# Patient Record
Sex: Male | Born: 1955 | Race: White | Hispanic: No | Marital: Married | Smoking: Never smoker
Health system: Southern US, Community
[De-identification: ages and names within clinical notes are randomized; demographics above are authoritative.]

## PROBLEM LIST (undated history)

## (undated) DIAGNOSIS — H26003 Unspecified infantile and juvenile cataract, bilateral: Secondary | ICD-10-CM

## (undated) DIAGNOSIS — E119 Type 2 diabetes mellitus without complications: Principal | ICD-10-CM

## (undated) DIAGNOSIS — H409 Unspecified glaucoma: Secondary | ICD-10-CM

## (undated) HISTORY — DX: Unspecified glaucoma: H40.9

## (undated) HISTORY — PX: EYE SURGERY: SHX253

## (undated) HISTORY — DX: Type 2 diabetes mellitus without complications: E11.9

## (undated) HISTORY — DX: Unspecified infantile and juvenile cataract, bilateral: H26.003

---

## 1986-04-03 DIAGNOSIS — E119 Type 2 diabetes mellitus without complications: Secondary | ICD-10-CM

## 1986-04-03 HISTORY — DX: Type 2 diabetes mellitus without complications: E11.9

## 2002-01-27 ENCOUNTER — Ambulatory Visit (HOSPITAL_BASED_OUTPATIENT_CLINIC_OR_DEPARTMENT_OTHER): Admission: RE | Admit: 2002-01-27 | Discharge: 2002-01-27 | Payer: Self-pay | Admitting: Family Medicine

## 2004-06-29 ENCOUNTER — Ambulatory Visit: Payer: Self-pay | Admitting: Family Medicine

## 2005-01-19 ENCOUNTER — Ambulatory Visit: Payer: Self-pay | Admitting: Internal Medicine

## 2005-11-13 ENCOUNTER — Ambulatory Visit: Payer: Self-pay | Admitting: Internal Medicine

## 2005-12-05 ENCOUNTER — Ambulatory Visit: Payer: Self-pay | Admitting: Internal Medicine

## 2007-03-12 ENCOUNTER — Telehealth (INDEPENDENT_AMBULATORY_CARE_PROVIDER_SITE_OTHER): Payer: Self-pay | Admitting: *Deleted

## 2007-03-13 ENCOUNTER — Telehealth (INDEPENDENT_AMBULATORY_CARE_PROVIDER_SITE_OTHER): Payer: Self-pay | Admitting: *Deleted

## 2007-09-18 ENCOUNTER — Ambulatory Visit: Payer: Self-pay | Admitting: Internal Medicine

## 2007-09-18 LAB — CONVERTED CEMR LAB
ALT: 54 units/L — ABNORMAL HIGH (ref 0–53)
AST: 39 units/L — ABNORMAL HIGH (ref 0–37)
BUN: 12 mg/dL (ref 6–23)
Calcium: 9.4 mg/dL (ref 8.4–10.5)
Chloride: 99 meq/L (ref 96–112)
Cholesterol: 200 mg/dL (ref 0–200)
Creatinine, Ser: 0.81 mg/dL (ref 0.40–1.50)
HDL: 37 mg/dL — ABNORMAL LOW (ref >39)
Microalb, Ur: 0.44 mg/dL (ref 0.00–1.89)
Total Bilirubin: 1 mg/dL (ref 0.3–1.2)
Total CHOL/HDL Ratio: 5.4
VLDL: 37 mg/dL (ref 0–40)

## 2007-09-20 ENCOUNTER — Ambulatory Visit: Payer: Self-pay | Admitting: *Deleted

## 2007-11-28 ENCOUNTER — Ambulatory Visit: Payer: Self-pay | Admitting: Internal Medicine

## 2008-01-08 ENCOUNTER — Ambulatory Visit: Payer: Self-pay | Admitting: Internal Medicine

## 2008-01-08 LAB — CONVERTED CEMR LAB
Cholesterol: 164 mg/dL (ref 0–200)
Total CHOL/HDL Ratio: 4.6
VLDL: 41 mg/dL — ABNORMAL HIGH (ref 0–40)

## 2008-04-16 ENCOUNTER — Ambulatory Visit: Payer: Self-pay | Admitting: Internal Medicine

## 2008-04-21 ENCOUNTER — Ambulatory Visit: Payer: Self-pay | Admitting: Internal Medicine

## 2008-05-07 ENCOUNTER — Ambulatory Visit: Payer: Self-pay | Admitting: Internal Medicine

## 2008-07-15 ENCOUNTER — Ambulatory Visit: Payer: Self-pay | Admitting: Internal Medicine

## 2008-09-03 ENCOUNTER — Ambulatory Visit: Payer: Self-pay | Admitting: Internal Medicine

## 2008-10-14 ENCOUNTER — Ambulatory Visit: Payer: Self-pay | Admitting: Internal Medicine

## 2009-04-29 ENCOUNTER — Ambulatory Visit: Payer: Self-pay | Admitting: Internal Medicine

## 2012-01-11 ENCOUNTER — Ambulatory Visit (INDEPENDENT_AMBULATORY_CARE_PROVIDER_SITE_OTHER): Payer: Self-pay | Admitting: Family Medicine

## 2012-01-11 ENCOUNTER — Encounter: Payer: Self-pay | Admitting: Family Medicine

## 2012-01-11 VITALS — BP 116/74 | HR 84 | Temp 99.0°F | Ht 72.0 in | Wt 202.7 lb

## 2012-01-11 DIAGNOSIS — H269 Unspecified cataract: Secondary | ICD-10-CM

## 2012-01-11 DIAGNOSIS — E119 Type 2 diabetes mellitus without complications: Secondary | ICD-10-CM

## 2012-01-11 DIAGNOSIS — H409 Unspecified glaucoma: Secondary | ICD-10-CM

## 2012-01-11 DIAGNOSIS — H26003 Unspecified infantile and juvenile cataract, bilateral: Secondary | ICD-10-CM

## 2012-01-11 HISTORY — DX: Unspecified infantile and juvenile cataract, bilateral: H26.003

## 2012-01-11 HISTORY — DX: Type 2 diabetes mellitus without complications: E11.9

## 2012-01-11 MED ORDER — LISINOPRIL 10 MG PO TABS: 10.0000 mg | ORAL_TABLET | Freq: Every day | ORAL | Status: DC

## 2012-01-11 MED ORDER — PIOGLITAZONE HCL 30 MG PO TABS: 30.0000 mg | ORAL_TABLET | Freq: Every day | ORAL | Status: DC

## 2012-01-11 MED ORDER — GLYBURIDE 5 MG PO TABS: 10.0000 mg | ORAL_TABLET | Freq: Two times a day (BID) | ORAL | Status: DC

## 2012-01-11 MED ORDER — METFORMIN HCL ER 750 MG PO TB24: 750.0000 mg | ORAL_TABLET | Freq: Two times a day (BID) | ORAL | Status: DC

## 2012-01-11 MED ORDER — CITALOPRAM HYDROBROMIDE 40 MG PO TABS: 40.0000 mg | ORAL_TABLET | Freq: Every day | ORAL | Status: DC

## 2012-01-11 NOTE — Progress Notes (Signed)
Patient ID: Robert Bowen, male   DOB: 07-Jun-1955, 56 y.o.   MRN: [id number] Subjective: The patient is a 56 y.o. year old male who presents today for new patient appointment.  1. DM: Diagnosed in 30s.  mantained on orals all that time.  Doesn't check BG regularly.  Last A1c was around a year ago.  Thinks it was around 7.  No problems with fevers, chills, shortness of breath, palpitations, low blood sugar, or excessive weight gain.  2. Cough: Has happened several times in the last year.  Develops cough (non-productive), slight sore throat, rhinorrhea and fatigue.  This lasts for 1-2 weeks.  Currently has a mild case of this.  No fevers, chills, shortness of breath, or other symptoms.  Patient's past medical, social, and family history were reviewed and updated as appropriate. History  Substance Use Topics  . Smoking status: Never Smoker   . Smokeless tobacco: Never Used  . Alcohol Use: Yes     Social   Objective:  Filed Vitals:   01/11/12 0952  BP: 116/74  Pulse: 84  Temp: 99 F (37.2 C)   Gen: NAD HEENT: MMM, EOMI, cataracts bilaterally, left worse than right, TM normal b/l.  Pharynx non-erythematous, slight clear nasal drainage CV: RRR, no murmurs Resp: CTABL Ext: No edema  Assessment/Plan: Cough: Unclear etiology.  Probably combination of allergies, URI, and possibly contribution from lisinopril.  Will try brief cessation of lisinopril and see if this improves.  Please also see individual problems in problem list for problem-specific plans.

## 2012-01-11 NOTE — Patient Instructions (Signed)
It was great to see you today! I want you to go ahead and try going off your lisinopril for 2 weeks. I am giving you refills on your prescriptions.  It is OK for you to use the actos with your glyburide. Come back to see me in a couple months once you have gone through the orange card process so we can do some labs.

## 2012-01-16 NOTE — Assessment & Plan Note (Signed)
Continue current meds.  Refills given.  Will hold off on labs until seen by financial assistance.  At that time would plan to obtain A1c, FLP, CBC, and CMET.

## 2012-05-22 ENCOUNTER — Other Ambulatory Visit: Payer: Self-pay | Admitting: *Deleted

## 2012-05-23 MED ORDER — LISINOPRIL 10 MG PO TABS: 10.0000 mg | ORAL_TABLET | Freq: Every day | ORAL | Status: DC

## 2012-10-24 ENCOUNTER — Telehealth: Payer: Self-pay | Admitting: *Deleted

## 2012-10-24 NOTE — Telephone Encounter (Signed)
Pt called to ask about yearly  Visit - a1 c - pt replied he has no insurance and does not qualify for orange card or other insurance - referred to [facility], RN-BSN

## 2012-11-21 ENCOUNTER — Other Ambulatory Visit: Payer: Self-pay | Admitting: Family Medicine

## 2012-11-22 ENCOUNTER — Other Ambulatory Visit: Payer: Self-pay | Admitting: *Deleted

## 2012-11-22 ENCOUNTER — Telehealth: Payer: Self-pay | Admitting: *Deleted

## 2012-11-22 NOTE — Telephone Encounter (Signed)
Received message from [facility], they faxed over a request for Celexa on this patient and never received it back. They will send another request today.Busick, Rodena Medin

## 2012-11-23 MED ORDER — CITALOPRAM HYDROBROMIDE 40 MG PO TABS: 40.0000 mg | ORAL_TABLET | Freq: Every day | ORAL | Status: DC

## 2012-11-23 NOTE — Telephone Encounter (Signed)
Refilled citalopram prescription for one month. Patient needs to be seen in the office as I have never met her.

## 2013-01-22 ENCOUNTER — Other Ambulatory Visit: Payer: Self-pay | Admitting: Family Medicine

## 2013-01-28 NOTE — Telephone Encounter (Signed)
Informed patient about refill and that he needs to make an appointment before any more refills will be done.

## 2013-01-28 NOTE — Telephone Encounter (Signed)
Informed patient about refill and that he needs to make an appointment before any more refills will be done.  

## 2013-02-11 ENCOUNTER — Other Ambulatory Visit: Payer: Self-pay | Admitting: Family Medicine

## 2013-03-26 ENCOUNTER — Other Ambulatory Visit: Payer: Self-pay | Admitting: Family Medicine

## 2013-04-14 ENCOUNTER — Telehealth: Payer: Self-pay | Admitting: *Deleted

## 2013-04-14 NOTE — Telephone Encounter (Signed)
Spoke with pt regarding him needing an appt to check on his diabetes.  States that he is unable to afford to come in and that is why it has been over a year since his last visit.  Also he has tried applying for [facility] but was told he made too much money.  Informed pt that we would like to see him to check up when he thinks he is able to make it in.  He voiced understanding for this. Jazmin Hartsell,CMA

## 2016-03-01 ENCOUNTER — Ambulatory Visit (INDEPENDENT_AMBULATORY_CARE_PROVIDER_SITE_OTHER): Payer: Self-pay | Admitting: Physician Assistant

## 2016-03-01 ENCOUNTER — Encounter: Payer: Self-pay | Admitting: Physician Assistant

## 2016-03-01 VITALS — BP 124/70 | HR 76 | Temp 98.1°F | Resp 16 | Ht 72.0 in | Wt 195.0 lb

## 2016-03-01 DIAGNOSIS — F419 Anxiety disorder, unspecified: Secondary | ICD-10-CM

## 2016-03-01 DIAGNOSIS — Z23 Encounter for immunization: Secondary | ICD-10-CM

## 2016-03-01 DIAGNOSIS — B9689 Other specified bacterial agents as the cause of diseases classified elsewhere: Secondary | ICD-10-CM

## 2016-03-01 DIAGNOSIS — F329 Major depressive disorder, single episode, unspecified: Secondary | ICD-10-CM

## 2016-03-01 DIAGNOSIS — J019 Acute sinusitis, unspecified: Secondary | ICD-10-CM

## 2016-03-01 DIAGNOSIS — F418 Other specified anxiety disorders: Secondary | ICD-10-CM

## 2016-03-01 DIAGNOSIS — E119 Type 2 diabetes mellitus without complications: Secondary | ICD-10-CM

## 2016-03-01 LAB — HEMOGLOBIN A1C: Hgb A1c MFr Bld: 6.6 % — ABNORMAL HIGH (ref 4.6–6.5)

## 2016-03-01 LAB — BASIC METABOLIC PANEL
BUN: 14 mg/dL (ref 6–23)
CALCIUM: 9.8 mg/dL (ref 8.4–10.5)
CO2: 30 meq/L (ref 19–32)
CREATININE: 0.9 mg/dL (ref 0.40–1.50)
Chloride: 100 mEq/L (ref 96–112)
GFR: 91.1 mL/min (ref >60.00)
Glucose, Bld: 195 mg/dL — ABNORMAL HIGH (ref 70–99)
Potassium: 4.5 mEq/L (ref 3.5–5.1)
Sodium: 137 mEq/L (ref 135–145)

## 2016-03-01 MED ORDER — GLYBURIDE 5 MG PO TABS: ORAL_TABLET | ORAL | 0 refills | Status: DC

## 2016-03-01 MED ORDER — AMOXICILLIN-POT CLAVULANATE 875-125 MG PO TABS: 1.0000 | ORAL_TABLET | Freq: Two times a day (BID) | ORAL | 0 refills | Status: DC

## 2016-03-01 MED ORDER — METFORMIN HCL ER 750 MG PO TB24: ORAL_TABLET | ORAL | 0 refills | Status: DC

## 2016-03-01 NOTE — Patient Instructions (Signed)
Please go to the lab for blood work. I will call you with your results and we will alter your regimen accordingly.  I would like for you to start checking fasting sugar every few days. If you are noting fasting sugars < 80, I would like to decrease your dose of the Diabeta.  Keep active and eating a well-balanced diet.   Follow-up will be based on lab results.   Please take antibiotic as directed.  Increase fluid intake.  Use Saline nasal spray.  Take a daily multivitamin.  Place a humidifier in the bedroom.  Please call or return clinic if symptoms are not improving.  Sinusitis Sinusitis is redness, soreness, and swelling (inflammation) of the paranasal sinuses. Paranasal sinuses are air pockets within the bones of your face (beneath the eyes, the middle of the forehead, or above the eyes). In healthy paranasal sinuses, mucus is able to drain out, and air is able to circulate through them by way of your nose. However, when your paranasal sinuses are inflamed, mucus and air can become trapped. This can allow bacteria and other germs to grow and cause infection. Sinusitis can develop quickly and last only a short time (acute) or continue over a long period (chronic). Sinusitis that lasts for more than 12 weeks is considered chronic.  CAUSES  Causes of sinusitis include:  Allergies.  Structural abnormalities, such as displacement of the cartilage that separates your nostrils (deviated septum), which can decrease the air flow through your nose and sinuses and affect sinus drainage.  Functional abnormalities, such as when the small hairs (cilia) that line your sinuses and help remove mucus do not work properly or are not present. SYMPTOMS  Symptoms of acute and chronic sinusitis are the same. The primary symptoms are pain and pressure around the affected sinuses. Other symptoms include:  Upper toothache.  Earache.  Headache.  Bad breath.  Decreased sense of smell and taste.  A cough,  which worsens when you are lying flat.  Fatigue.  Fever.  Thick drainage from your nose, which often is green and may contain pus (purulent).  Swelling and warmth over the affected sinuses. DIAGNOSIS  Your caregiver will perform a physical exam. During the exam, your caregiver may:  Look in your nose for signs of abnormal growths in your nostrils (nasal polyps).  Tap over the affected sinus to check for signs of infection.  View the inside of your sinuses (endoscopy) with a special imaging device with a light attached (endoscope), which is inserted into your sinuses. If your caregiver suspects that you have chronic sinusitis, one or more of the following tests may be recommended:  Allergy tests.  Nasal culture A sample of mucus is taken from your nose and sent to a lab and screened for bacteria.  Nasal cytology A sample of mucus is taken from your nose and examined by your caregiver to determine if your sinusitis is related to an allergy. TREATMENT  Most cases of acute sinusitis are related to a viral infection and will resolve on their own within 10 days. Sometimes medicines are prescribed to help relieve symptoms (pain medicine, decongestants, nasal steroid sprays, or saline sprays).  However, for sinusitis related to a bacterial infection, your caregiver will prescribe antibiotic medicines. These are medicines that will help kill the bacteria causing the infection.  Rarely, sinusitis is caused by a fungal infection. In theses cases, your caregiver will prescribe antifungal medicine. For some cases of chronic sinusitis, surgery is needed. Generally, these are  cases in which sinusitis recurs more than 3 times per year, despite other treatments. HOME CARE INSTRUCTIONS   Drink plenty of water. Water helps thin the mucus so your sinuses can drain more easily.  Use a humidifier.  Inhale steam 3 to 4 times a day (for example, sit in the bathroom with the shower running).  Apply a  warm, moist washcloth to your face 3 to 4 times a day, or as directed by your caregiver.  Use saline nasal sprays to help moisten and clean your sinuses.  Take over-the-counter or prescription medicines for pain, discomfort, or fever only as directed by your caregiver. SEEK IMMEDIATE MEDICAL CARE IF:  You have increasing pain or severe headaches.  You have nausea, vomiting, or drowsiness.  You have swelling around your face.  You have vision problems.  You have a stiff neck.  You have difficulty breathing. MAKE SURE YOU:   Understand these instructions.  Will watch your condition.  Will get help right away if you are not doing well or get worse. Document Released: 03/20/2005 Document Revised: 06/12/2011 Document Reviewed: 04/04/2011 [facility] Patient Information 2014 [place], [place].

## 2016-03-01 NOTE — Progress Notes (Signed)
Pre visit review using our clinic review tool, if applicable. No additional management support is needed unless otherwise documented below in the visit note. 

## 2016-03-01 NOTE — Progress Notes (Signed)
Patient presents to clinic today to establish care.   Patient with history of Diabetes Mellitus II, previously controlled. Patient is on current regimen of Metformin XR 750 mg BID, Glyburide 10 mg BID. Was previously on Farxiga 5 mg daily as well but patient assistance expired and they cannot afford. Patient is not checking sugars. Is watching diet and exercising regularly. Denies numbness or tingling of extremities. Denies history of CKD or diabetic nephropathy. Denies any foot sores, discomfort or other abnormality.   Patient also with history of Anxiety/Depression, well-controlled on Citalopram 40 mg daily. Is taking as directed without side effects. Denies breakthrough depression, anhedonia or anxiety on this medication. Denies SI/HI.  Patient c/o > 1 week of sinus pressure, maxillary sinus pain, dry cough, post-nasal drip, acutely worsening 5 days ago. Denies fever, chills. Has taken Allegra without improvement in symptoms. Denies recent travel or sick contact.  Past Medical History:  Diagnosis Date  . Diabetes mellitus, type 2 ([place]) 1988  . Glaucoma   . Juvenile cataract of both eyes     Current Outpatient Prescriptions on File Prior to Visit  Medication Sig Dispense Refill  . citalopram (CELEXA) 40 MG tablet TAKE 1 TABLET (40 MG TOTAL) BY MOUTH DAILY-NEED TO MAKE APPOINTMENT WITH PRIMARY CARE DOCTOR. 30 tablet 0   No current facility-administered medications on file prior to visit.     No Known Allergies  Family History  Problem Relation Age of Onset  . Diabetes type II Father   . Heart disease Father   . Liver cancer Mother     Social History   Social History  . Marital status: Married    Spouse name: N/A  . Number of children: N/A  . Years of education: N/A   Social History Main Topics  . Smoking status: Never Smoker  . Smokeless tobacco: Never Used  . Alcohol use Yes     Comment: Social  . Drug use: No  . Sexual activity: Yes   Other Topics Concern  .  None   Social History Narrative   Part time employment.   Non-denominational pastor.   Review of Systems - See HPI.  All other ROS are negative.  BP 124/70   Pulse 76   Temp 98.1 F (36.7 C) (Oral)   Resp 16   Ht 6' (1.829 m)   Wt 195 lb (88.5 kg)   SpO2 98%   BMI 26.45 kg/m   Physical Exam  Constitutional: He is oriented to person, place, and time and well-developed, well-nourished, and in no distress.  HENT:  Head: Normocephalic and atraumatic.  Right Ear: Tympanic membrane normal.  Left Ear: Tympanic membrane normal.  Nose: Mucosal edema present. Right sinus exhibits maxillary sinus tenderness and frontal sinus tenderness.  Mouth/Throat: Uvula is midline, oropharynx is clear and moist and mucous membranes are normal.  Eyes: Conjunctivae are normal.  Neck: Neck supple.  Cardiovascular: Normal rate, regular rhythm, normal heart sounds and intact distal pulses.   Pulmonary/Chest: Effort normal and breath sounds normal. No respiratory distress. He has no wheezes. He has no rales. He exhibits no tenderness.  Neurological: He is alert and oriented to person, place, and time.  Skin: Skin is warm and dry. No rash noted.  Psychiatric: Affect normal.  Vitals reviewed.   Recent Results (from the past 2160 hour(s))  Basic metabolic panel     Status: Abnormal   Collection Time: 03/01/16  2:35 PM  Result Value Ref Range   Sodium 137 135 -  145 mEq/L   Potassium 4.5 3.5 - 5.1 mEq/L   Chloride 100 96 - 112 mEq/L   CO2 30 19 - 32 mEq/L   Glucose, Bld 195 (H) 70 - 99 mg/dL   BUN 14 6 - 23 mg/dL   Creatinine, Ser 0.90 0.40 - 1.50 mg/dL   Calcium 9.8 8.4 - 10.5 mg/dL   GFR 91.10 >60.00 mL/min  Hemoglobin A1c     Status: Abnormal   Collection Time: 03/01/16  2:35 PM  Result Value Ref Range   Hgb A1c MFr Bld 6.6 (H) 4.6 - 6.5 %    Comment: Glycemic Control Guidelines for People with Diabetes:Non Diabetic:  <6%Goal of Therapy: <7%Additional Action Suggested:  >8%      Assessment/Plan: Controlled type 2 diabetes mellitus without complication, without long-term current use of insulin (HCC) Will repeat BMP and A1C today. Patient assistance for [place] completed. Continue current medications. Goal will be to wean him down off of the Diabeta if possible. I am not a fan of long-term SU especially at such a high dosage.   Anxiety and depression Doing very well. Continue current medication regimen.   Acute bacterial sinusitis Rx Augmentin.  Increase fluids.  Rest.  Saline nasal spray.  Probiotic.  Humidifier in bedroom.  Call or return to clinic if symptoms are not improving.     Leeanne Rio, PA-C

## 2016-03-02 DIAGNOSIS — E119 Type 2 diabetes mellitus without complications: Secondary | ICD-10-CM | POA: Insufficient documentation

## 2016-03-02 DIAGNOSIS — F329 Major depressive disorder, single episode, unspecified: Secondary | ICD-10-CM | POA: Insufficient documentation

## 2016-03-02 DIAGNOSIS — F32A Depression, unspecified: Secondary | ICD-10-CM

## 2016-03-02 DIAGNOSIS — B9689 Other specified bacterial agents as the cause of diseases classified elsewhere: Secondary | ICD-10-CM | POA: Insufficient documentation

## 2016-03-02 DIAGNOSIS — J019 Acute sinusitis, unspecified: Secondary | ICD-10-CM

## 2016-03-02 DIAGNOSIS — F419 Anxiety disorder, unspecified: Secondary | ICD-10-CM

## 2016-03-02 HISTORY — DX: Depression, unspecified: F32.A

## 2016-03-02 HISTORY — DX: Anxiety disorder, unspecified: F41.9

## 2016-03-02 HISTORY — DX: Type 2 diabetes mellitus without complications: E11.9

## 2016-03-02 NOTE — Assessment & Plan Note (Signed)
Will repeat BMP and A1C today. Patient assistance for Robert Bowen completed. Continue current medications. Goal will be to wean him down off of the Diabeta if possible. I am not a fan of long-term SU especially at such a high dosage.

## 2016-03-02 NOTE — Assessment & Plan Note (Signed)
Doing very well. Continue current medication regimen.  

## 2016-03-02 NOTE — Assessment & Plan Note (Signed)
Rx Augmentin.  Increase fluids.  Rest.  Saline nasal spray.  Probiotic.  Humidifier in bedroom.  Call or return to clinic if symptoms are not improving.

## 2016-03-06 ENCOUNTER — Telehealth: Payer: Self-pay | Admitting: Physician Assistant

## 2016-03-06 NOTE — Telephone Encounter (Signed)
Wife states that pt in not feeling any better from last weeks visit and asking if there is anything he could try or do.

## 2016-03-06 NOTE — Telephone Encounter (Signed)
Called the patient wife back at number given but does not work. LMOVM for patient for current symptoms.

## 2016-03-07 ENCOUNTER — Other Ambulatory Visit: Payer: Self-pay | Admitting: Emergency Medicine

## 2016-03-07 ENCOUNTER — Encounter: Payer: Self-pay | Admitting: Emergency Medicine

## 2016-03-07 MED ORDER — FLUTICASONE PROPIONATE 50 MCG/ACT NA SUSP: 2.0000 | Freq: Every day | NASAL | 3 refills | Status: DC

## 2016-03-07 NOTE — Telephone Encounter (Signed)
Per Einar Pheasant can try Flonase 50 mcg 1-2 sprays each nostril daily to help and finished the abx.   Spoke with patient, he is still having sinus pain of the right maxillary region, runny/dripping nose. He still has 1-2 days left of the abx.

## 2016-03-07 NOTE — Telephone Encounter (Signed)
Called the number given and the phone line is busy.

## 2016-03-08 NOTE — Telephone Encounter (Signed)
Sent message by Mychart informing patient

## 2016-03-22 ENCOUNTER — Other Ambulatory Visit: Payer: Self-pay | Admitting: Emergency Medicine

## 2016-03-22 ENCOUNTER — Telehealth: Payer: Self-pay | Admitting: Physician Assistant

## 2016-03-22 MED ORDER — CITALOPRAM HYDROBROMIDE 40 MG PO TABS: 40.0000 mg | ORAL_TABLET | Freq: Every day | ORAL | 3 refills | Status: DC

## 2016-03-22 NOTE — Telephone Encounter (Signed)
Tyler w/[facility] states pt is requesting a refill on citalopram

## 2016-03-22 NOTE — Telephone Encounter (Signed)
Sent refills of the Citalopram to the [facility]

## 2016-05-10 ENCOUNTER — Other Ambulatory Visit: Payer: Self-pay | Admitting: Physician Assistant

## 2016-05-10 DIAGNOSIS — E119 Type 2 diabetes mellitus without complications: Secondary | ICD-10-CM

## 2016-05-17 ENCOUNTER — Ambulatory Visit (INDEPENDENT_AMBULATORY_CARE_PROVIDER_SITE_OTHER): Payer: Self-pay | Admitting: Physician Assistant

## 2016-05-17 ENCOUNTER — Encounter: Payer: Self-pay | Admitting: Physician Assistant

## 2016-05-17 VITALS — Temp 98.0°F | Resp 14 | Ht 72.0 in | Wt 193.0 lb

## 2016-05-17 DIAGNOSIS — M7062 Trochanteric bursitis, left hip: Secondary | ICD-10-CM

## 2016-05-17 DIAGNOSIS — E119 Type 2 diabetes mellitus without complications: Secondary | ICD-10-CM

## 2016-05-17 DIAGNOSIS — B9689 Other specified bacterial agents as the cause of diseases classified elsewhere: Secondary | ICD-10-CM

## 2016-05-17 DIAGNOSIS — J019 Acute sinusitis, unspecified: Secondary | ICD-10-CM

## 2016-05-17 LAB — GLUCOSE, POCT (MANUAL RESULT ENTRY): POC GLUCOSE: 193 mg/dL — AB (ref 70–99)

## 2016-05-17 MED ORDER — DOXYCYCLINE HYCLATE 100 MG PO CAPS: 100.0000 mg | ORAL_CAPSULE | Freq: Two times a day (BID) | ORAL | 0 refills | Status: DC

## 2016-05-17 MED ORDER — MELOXICAM 15 MG PO TABS: 15.0000 mg | ORAL_TABLET | Freq: Every day | ORAL | 0 refills | Status: DC

## 2016-05-17 NOTE — Patient Instructions (Addendum)
Please take antibiotic as directed.  Increase fluid intake.  Use Saline nasal spray.  Take a daily multivitamin.  Place a humidifier in the bedroom.  Please call or return clinic if symptoms are not improving.  Please start the Meloxicam with food once daily to help with hip pain. I am setting you up with Sports Medicine for further treatment.   Follow-up with me in 1-2 weeks fasting. We will follow-up on diabetes and check lipid panel since we have not received records from previous primary provider.  Sinusitis Sinusitis is redness, soreness, and swelling (inflammation) of the paranasal sinuses. Paranasal sinuses are air pockets within the bones of your face (beneath the eyes, the middle of the forehead, or above the eyes). In healthy paranasal sinuses, mucus is able to drain out, and air is able to circulate through them by way of your nose. However, when your paranasal sinuses are inflamed, mucus and air can become trapped. This can allow bacteria and other germs to grow and cause infection. Sinusitis can develop quickly and last only a short time (acute) or continue over a long period (chronic). Sinusitis that lasts for more than 12 weeks is considered chronic.  CAUSES  Causes of sinusitis include:  Allergies.  Structural abnormalities, such as displacement of the cartilage that separates your nostrils (deviated septum), which can decrease the air flow through your nose and sinuses and affect sinus drainage.  Functional abnormalities, such as when the small hairs (cilia) that line your sinuses and help remove mucus do not work properly or are not present. SYMPTOMS  Symptoms of acute and chronic sinusitis are the same. The primary symptoms are pain and pressure around the affected sinuses. Other symptoms include:  Upper toothache.  Earache.  Headache.  Bad breath.  Decreased sense of smell and taste.  A cough, which worsens when you are lying flat.  Fatigue.  Fever.  Thick  drainage from your nose, which often is green and may contain pus (purulent).  Swelling and warmth over the affected sinuses. DIAGNOSIS  Your caregiver will perform a physical exam. During the exam, your caregiver may:  Look in your nose for signs of abnormal growths in your nostrils (nasal polyps).  Tap over the affected sinus to check for signs of infection.  View the inside of your sinuses (endoscopy) with a special imaging device with a light attached (endoscope), which is inserted into your sinuses. If your caregiver suspects that you have chronic sinusitis, one or more of the following tests may be recommended:  Allergy tests.  Nasal culture A sample of mucus is taken from your nose and sent to a lab and screened for bacteria.  Nasal cytology A sample of mucus is taken from your nose and examined by your caregiver to determine if your sinusitis is related to an allergy. TREATMENT  Most cases of acute sinusitis are related to a viral infection and will resolve on their own within 10 days. Sometimes medicines are prescribed to help relieve symptoms (pain medicine, decongestants, nasal steroid sprays, or saline sprays).  However, for sinusitis related to a bacterial infection, your caregiver will prescribe antibiotic medicines. These are medicines that will help kill the bacteria causing the infection.  Rarely, sinusitis is caused by a fungal infection. In theses cases, your caregiver will prescribe antifungal medicine. For some cases of chronic sinusitis, surgery is needed. Generally, these are cases in which sinusitis recurs more than 3 times per year, despite other treatments. HOME CARE INSTRUCTIONS   Drink  plenty of water. Water helps thin the mucus so your sinuses can drain more easily.  Use a humidifier.  Inhale steam 3 to 4 times a day (for example, sit in the bathroom with the shower running).  Apply a warm, moist washcloth to your face 3 to 4 times a day, or as directed by  your caregiver.  Use saline nasal sprays to help moisten and clean your sinuses.  Take over-the-counter or prescription medicines for pain, discomfort, or fever only as directed by your caregiver. SEEK IMMEDIATE MEDICAL CARE IF:  You have increasing pain or severe headaches.  You have nausea, vomiting, or drowsiness.  You have swelling around your face.  You have vision problems.  You have a stiff neck.  You have difficulty breathing. MAKE SURE YOU:   Understand these instructions.  Will watch your condition.  Will get help right away if you are not doing well or get worse. Document Released: 03/20/2005 Document Revised: 06/12/2011 Document Reviewed: 04/04/2011 [facility] Patient Information 2014 [place], [place].

## 2016-05-17 NOTE — Progress Notes (Signed)
Patient presents to clinic today c/o sinus headache and fatigue with decreased appetite since Sunday. Has had a few weeks of nasal congestion with sinus pressure. Now with sinus pain. Noted some dizziness with turning head quickly, lasting only a few seconds. Is taking allegra to help with allergy symptoms. Patient denies fever, chills. Denies recent travel or sick contact.   Patient also endorses point tenderness of L hip over the past few weeks that has gradually worsened. Denies skin changes. Denies numbness or tingling. Denies bruising or swelling. Denies trauma or injury to the area. Some pain with ambulation but pain mainly occurs if there is pressure applied to the area. Has taken Ibuprofen with some improvement in symptoms.  Past Medical History:  Diagnosis Date  . Diabetes mellitus, type 2 ([place]) 1988  . Glaucoma   . Juvenile cataract of both eyes     Current Outpatient Prescriptions on File Prior to Visit  Medication Sig Dispense Refill  . citalopram (CELEXA) 40 MG tablet Take 1 tablet (40 mg total) by mouth daily. 30 tablet 3  . fluticasone (FLONASE) 50 MCG/ACT nasal spray Place 2 sprays into both nostrils daily. 16 g 3  . glyBURIDE (DIABETA) 5 MG tablet TAKE 2 TABLETS BY MOUTH TWICE DAILY WITH MEALS 120 tablet 0  . metFORMIN (GLUCOPHAGE-XR) 750 MG 24 hr tablet TAKE 1 TABLET (750 MG TOTAL) BY MOUTH 2 (TWO) TIMES DAILY. 60 tablet 0  . dapagliflozin propanediol (FARXIGA) 5 MG TABS tablet Take 1 mg by mouth daily.     No current facility-administered medications on file prior to visit.     No Known Allergies  Family History  Problem Relation Age of Onset  . Diabetes type II Father   . Heart disease Father   . Liver cancer Mother     Social History   Social History  . Marital status: Married    Spouse name: N/A  . Number of children: N/A  . Years of education: N/A   Social History Main Topics  . Smoking status: Never Smoker  . Smokeless tobacco: Never Used  .  Alcohol use Yes     Comment: Social  . Drug use: No  . Sexual activity: Yes   Other Topics Concern  . None   Social History Narrative   Part time employment.   Non-denominational pastor.   Review of Systems - See HPI.  All other ROS are negative.  BP 112/72   Pulse 68   Temp 98 F (36.7 C) (Oral)   Resp 14   Ht 6' (1.829 m)   Wt 193 lb (87.5 kg)   SpO2 98%   BMI 26.18 kg/m   Physical Exam  Constitutional: He is oriented to person, place, and time and well-developed, well-nourished, and in no distress.  HENT:  Head: Normocephalic and atraumatic.  Right Ear: A middle ear effusion is present.  Left Ear: Tympanic membrane normal.  Nose: Mucosal edema and rhinorrhea present. Right sinus exhibits maxillary sinus tenderness. Left sinus exhibits maxillary sinus tenderness.  Mouth/Throat: Uvula is midline, oropharynx is clear and moist and mucous membranes are normal.  Neck: Neck supple. No thyromegaly present.  Cardiovascular: Normal rate, regular rhythm, normal heart sounds and intact distal pulses.   Pulmonary/Chest: Effort normal and breath sounds normal. No respiratory distress. He has no wheezes. He has no rales. He exhibits no tenderness.  Musculoskeletal:       Left hip: He exhibits tenderness. He exhibits normal range of motion, normal strength,  no bony tenderness and no swelling.  Pain and tenderness over the greater trochanter. Concern for bursitis.  Lymphadenopathy:    He has cervical adenopathy.  Neurological: He is alert and oriented to person, place, and time.  Skin: Skin is warm and dry. No rash noted.  Psychiatric: Affect normal.  Vitals reviewed.   Recent Results (from the past 2160 hour(s))  Basic metabolic panel     Status: Abnormal   Collection Time: 03/01/16  2:35 PM  Result Value Ref Range   Sodium 137 135 - 145 mEq/L   Potassium 4.5 3.5 - 5.1 mEq/L   Chloride 100 96 - 112 mEq/L   CO2 30 19 - 32 mEq/L   Glucose, Bld 195 (H) 70 - 99 mg/dL   BUN 14  6 - 23 mg/dL   Creatinine, Ser 0.90 0.40 - 1.50 mg/dL   Calcium 9.8 8.4 - 10.5 mg/dL   GFR 91.10 >60.00 mL/min  Hemoglobin A1c     Status: Abnormal   Collection Time: 03/01/16  2:35 PM  Result Value Ref Range   Hgb A1c MFr Bld 6.6 (H) 4.6 - 6.5 %    Comment: Glycemic Control Guidelines for People with Diabetes:Non Diabetic:  <6%Goal of Therapy: <7%Additional Action Suggested:  >8%     Assessment/Plan: 1. Acute bacterial sinusitis Rx Doxycycline. Flonase daily. Supportive measures and OTC medications reviewed. FU if not resolving. - doxycycline (VIBRAMYCIN) 100 MG capsule; Take 1 capsule (100 mg total) by mouth 2 (two) times daily.  Dispense: 20 capsule; Refill: 0   2. Trochanteric bursitis of left hip Rx mobic. Referral to sports medicine placed for further management.   Leeanne Rio, PA-C

## 2016-05-17 NOTE — Progress Notes (Signed)
Pre visit review using our clinic review tool, if applicable. No additional management support is needed unless otherwise documented below in the visit note. 

## 2016-05-19 ENCOUNTER — Encounter: Payer: Self-pay | Admitting: Physician Assistant

## 2016-05-19 NOTE — Telephone Encounter (Signed)
Please call patient to assess current symptoms and need for appointment before the weekend.

## 2016-05-19 NOTE — Telephone Encounter (Signed)
Attempted to call patient  - no answer, no voicemail set up.   Will try again shortly.

## 2016-06-20 ENCOUNTER — Ambulatory Visit (INDEPENDENT_AMBULATORY_CARE_PROVIDER_SITE_OTHER): Payer: Self-pay | Admitting: Physician Assistant

## 2016-06-20 ENCOUNTER — Encounter: Payer: Self-pay | Admitting: Physician Assistant

## 2016-06-20 VITALS — BP 120/78 | HR 79 | Temp 98.9°F | Resp 14 | Ht 72.0 in | Wt 193.0 lb

## 2016-06-20 DIAGNOSIS — J019 Acute sinusitis, unspecified: Secondary | ICD-10-CM

## 2016-06-20 DIAGNOSIS — B9689 Other specified bacterial agents as the cause of diseases classified elsewhere: Secondary | ICD-10-CM

## 2016-06-20 DIAGNOSIS — E119 Type 2 diabetes mellitus without complications: Secondary | ICD-10-CM

## 2016-06-20 MED ORDER — DOXYCYCLINE HYCLATE 100 MG PO CAPS: 100.0000 mg | ORAL_CAPSULE | Freq: Two times a day (BID) | ORAL | 0 refills | Status: DC

## 2016-06-20 NOTE — Progress Notes (Signed)
Pre visit review using our clinic review tool, if applicable. No additional management support is needed unless otherwise documented below in the visit note. 

## 2016-06-20 NOTE — Patient Instructions (Signed)
Please take antibiotic as directed.  Increase fluid intake.  Use Saline nasal spray.  Take a daily multivitamin.  Place a humidifier in the bedroom.  Please call or return clinic if symptoms are not improving.  We are checking on the status of the assistance for Farxiga. If we cannot get approval we will select another medication this week.   Sinusitis Sinusitis is redness, soreness, and swelling (inflammation) of the paranasal sinuses. Paranasal sinuses are air pockets within the bones of your face (beneath the eyes, the middle of the forehead, or above the eyes). In healthy paranasal sinuses, mucus is able to drain out, and air is able to circulate through them by way of your nose. However, when your paranasal sinuses are inflamed, mucus and air can become trapped. This can allow bacteria and other germs to grow and cause infection. Sinusitis can develop quickly and last only a short time (acute) or continue over a long period (chronic). Sinusitis that lasts for more than 12 weeks is considered chronic.  CAUSES  Causes of sinusitis include:  Allergies.  Structural abnormalities, such as displacement of the cartilage that separates your nostrils (deviated septum), which can decrease the air flow through your nose and sinuses and affect sinus drainage.  Functional abnormalities, such as when the small hairs (cilia) that line your sinuses and help remove mucus do not work properly or are not present. SYMPTOMS  Symptoms of acute and chronic sinusitis are the same. The primary symptoms are pain and pressure around the affected sinuses. Other symptoms include:  Upper toothache.  Earache.  Headache.  Bad breath.  Decreased sense of smell and taste.  A cough, which worsens when you are lying flat.  Fatigue.  Fever.  Thick drainage from your nose, which often is green and may contain pus (purulent).  Swelling and warmth over the affected sinuses. DIAGNOSIS  Your caregiver will perform  a physical exam. During the exam, your caregiver may:  Look in your nose for signs of abnormal growths in your nostrils (nasal polyps).  Tap over the affected sinus to check for signs of infection.  View the inside of your sinuses (endoscopy) with a special imaging device with a light attached (endoscope), which is inserted into your sinuses. If your caregiver suspects that you have chronic sinusitis, one or more of the following tests may be recommended:  Allergy tests.  Nasal culture A sample of mucus is taken from your nose and sent to a lab and screened for bacteria.  Nasal cytology A sample of mucus is taken from your nose and examined by your caregiver to determine if your sinusitis is related to an allergy. TREATMENT  Most cases of acute sinusitis are related to a viral infection and will resolve on their own within 10 days. Sometimes medicines are prescribed to help relieve symptoms (pain medicine, decongestants, nasal steroid sprays, or saline sprays).  However, for sinusitis related to a bacterial infection, your caregiver will prescribe antibiotic medicines. These are medicines that will help kill the bacteria causing the infection.  Rarely, sinusitis is caused by a fungal infection. In theses cases, your caregiver will prescribe antifungal medicine. For some cases of chronic sinusitis, surgery is needed. Generally, these are cases in which sinusitis recurs more than 3 times per year, despite other treatments. HOME CARE INSTRUCTIONS   Drink plenty of water. Water helps thin the mucus so your sinuses can drain more easily.  Use a humidifier.  Inhale steam 3 to 4 times a day (  for example, sit in the bathroom with the shower running).  Apply a warm, moist washcloth to your face 3 to 4 times a day, or as directed by your caregiver.  Use saline nasal sprays to help moisten and clean your sinuses.  Take over-the-counter or prescription medicines for pain, discomfort, or fever only  as directed by your caregiver. SEEK IMMEDIATE MEDICAL CARE IF:  You have increasing pain or severe headaches.  You have nausea, vomiting, or drowsiness.  You have swelling around your face.  You have vision problems.  You have a stiff neck.  You have difficulty breathing. MAKE SURE YOU:   Understand these instructions.  Will watch your condition.  Will get help right away if you are not doing well or get worse. Document Released: 03/20/2005 Document Revised: 06/12/2011 Document Reviewed: 04/04/2011 [facility] Patient Information 2014 [place], [place].

## 2016-06-21 LAB — MICROALBUMIN / CREATININE URINE RATIO
Creatinine,U: 34.7 mg/dL
Microalb Creat Ratio: 2 mg/g (ref 0.0–30.0)
Microalb, Ur: <0.7 mg/dL (ref 0.0–1.9)

## 2016-06-21 LAB — COMPREHENSIVE METABOLIC PANEL
ALBUMIN: 4.9 g/dL (ref 3.5–5.2)
ALT: 29 U/L (ref 0–53)
AST: 25 U/L (ref 0–37)
Alkaline Phosphatase: 82 U/L (ref 39–117)
BUN: 12 mg/dL (ref 6–23)
CHLORIDE: 100 meq/L (ref 96–112)
CO2: 30 meq/L (ref 19–32)
CREATININE: 0.94 mg/dL (ref 0.40–1.50)
Calcium: 10 mg/dL (ref 8.4–10.5)
GFR: 86.55 mL/min (ref >60.00)
Glucose, Bld: 183 mg/dL — ABNORMAL HIGH (ref 70–99)
Potassium: 3.9 mEq/L (ref 3.5–5.1)
Sodium: 137 mEq/L (ref 135–145)
TOTAL PROTEIN: 7.6 g/dL (ref 6.0–8.3)
Total Bilirubin: 1 mg/dL (ref 0.2–1.2)

## 2016-06-21 LAB — HEMOGLOBIN A1C: Hgb A1c MFr Bld: 7.8 % — ABNORMAL HIGH (ref 4.6–6.5)

## 2016-06-22 ENCOUNTER — Encounter: Payer: Self-pay | Admitting: Physician Assistant

## 2016-06-22 MED ORDER — BENZONATATE 100 MG PO CAPS: 100.0000 mg | ORAL_CAPSULE | Freq: Two times a day (BID) | ORAL | 0 refills | Status: DC | PRN

## 2016-06-23 NOTE — Progress Notes (Signed)
History of Present Illness: Patient is a 61 y.o. male who presents to clinic today for follow-up of Diabetes Mellitus II.  Patient currently on medication regimen of Metformin XR 750 mg BID, Diabeta 10 mg BID.  Is taking medications as directed. Endorses good diet overall.  Denies vision changes, neuropathy, polyuria. Is checking blood glucose as directed. Notes fasting sugars averaging 150s.   Latest Maintenance: Diabetic Eye Exam -- Endorses due for eye exam. Will schedule.  Urine Microalbumin -- Due. Foot Exam -- Due. Will get today. Denies concerns today.  Patient also endorses 10 days of cough with chest congestion along with sinus pain, mainly R-sided and R ear pain. Denies fever, chills, chest pain or shortness of breath. Notes some tooth pain. Denies recent travel or sick contact. Has taken Allegra with little relief of symptoms.  Past Medical History:  Diagnosis Date  . Diabetes mellitus, type 2 ([place]) 1988  . Glaucoma   . Juvenile cataract of both eyes     Current Outpatient Prescriptions on File Prior to Visit  Medication Sig Dispense Refill  . citalopram (CELEXA) 40 MG tablet Take 1 tablet (40 mg total) by mouth daily. 30 tablet 3  . fluticasone (FLONASE) 50 MCG/ACT nasal spray Place 2 sprays into both nostrils daily. 16 g 3  . glyBURIDE (DIABETA) 5 MG tablet TAKE 2 TABLETS BY MOUTH TWICE DAILY WITH MEALS 120 tablet 0  . metFORMIN (GLUCOPHAGE-XR) 750 MG 24 hr tablet TAKE 1 TABLET (750 MG TOTAL) BY MOUTH 2 (TWO) TIMES DAILY. 60 tablet 0   No current facility-administered medications on file prior to visit.     No Known Allergies  Family History  Problem Relation Age of Onset  . Diabetes type II Father   . Heart disease Father   . Liver cancer Mother     Social History   Social History  . Marital status: Married    Spouse name: N/A  . Number of children: N/A  . Years of education: N/A   Social History Main Topics  . Smoking status: Never Smoker  . Smokeless  tobacco: Never Used  . Alcohol use Yes     Comment: Social  . Drug use: No  . Sexual activity: Yes   Other Topics Concern  . None   Social History Narrative   Part time employment.   Non-denominational pastor.   Review of Systems: Pertinent ROS are listed in HPI  Physical Examination: General appearance: alert, cooperative, appears stated age and no distress Head: Normocephalic, without obvious abnormality, atraumatic Ears: normal TM's and external ear canals both ears Nose: turbinates red, swollen, sinus tenderness right Throat: lips, mucosa, and tongue normal; teeth and gums normal Lungs: clear to auscultation bilaterally Heart: regular rate and rhythm, S1, S2 normal, no murmur, click, rub or gallop Extremities: extremities normal, atraumatic, no cyanosis or edema Pulses: 2+ and symmetric Lymph nodes: Cervical, supraclavicular, and axillary nodes normal.  Diabetic Foot Exam - Simple   Simple Foot Form Diabetic Foot exam was performed with the following findings:  Yes 06/20/2016  8:19 AM  Visual Inspection No deformities, no ulcerations, no other skin breakdown bilaterally:  Yes Sensation Testing Intact to touch and monofilament testing bilaterally:  Yes Pulse Check Posterior Tibialis and Dorsalis pulse intact bilaterally:  Yes Comments    Assessment/Plan: Acute bacterial sinusitis Rx Doxycycline.  Increase fluids.  Rest.  Saline nasal spray.  Probiotic.  Mucinex as directed.  Humidifier in bedroom.  Call or return to clinic if symptoms  are not improving.   Controlled type 2 diabetes mellitus without complication, without long-term current use of insulin ([place]) Repeat labs today. Foot exam performed today without abnormal findings. Continue current regimen for now. May increase Metformin XR dosage based on results. Dietary and exercise recommendations reviewed. Patient to have his eye examination this month. Foot exam updated today.

## 2016-06-23 NOTE — Assessment & Plan Note (Signed)
Rx Doxycycline.  Increase fluids.  Rest.  Saline nasal spray.  Probiotic.  Mucinex as directed.  Humidifier in bedroom.  Call or return to clinic if symptoms are not improving.  

## 2016-06-23 NOTE — Assessment & Plan Note (Addendum)
Repeat labs today. Foot exam performed today without abnormal findings. Continue current regimen for now. May increase Metformin XR dosage based on results. Dietary and exercise recommendations reviewed. Patient to have his eye examination this month. Foot exam updated today.

## 2016-06-27 MED ORDER — ALBUTEROL SULFATE HFA 108 (90 BASE) MCG/ACT IN AERS: 2.0000 | INHALATION_SPRAY | Freq: Four times a day (QID) | RESPIRATORY_TRACT | 0 refills | Status: DC | PRN

## 2016-06-27 MED ORDER — AMOXICILLIN-POT CLAVULANATE 875-125 MG PO TABS: 1.0000 | ORAL_TABLET | Freq: Two times a day (BID) | ORAL | 0 refills | Status: DC

## 2016-06-27 NOTE — Addendum Note (Signed)
Addended by: Brunetta Jeans on: 06/27/2016 08:16 AM   Modules accepted: Orders

## 2016-06-28 ENCOUNTER — Other Ambulatory Visit: Payer: Self-pay | Admitting: Emergency Medicine

## 2016-06-28 DIAGNOSIS — E119 Type 2 diabetes mellitus without complications: Secondary | ICD-10-CM

## 2016-06-29 ENCOUNTER — Encounter: Payer: Self-pay | Admitting: Emergency Medicine

## 2016-06-29 ENCOUNTER — Other Ambulatory Visit: Payer: Self-pay | Admitting: Emergency Medicine

## 2016-06-29 DIAGNOSIS — E119 Type 2 diabetes mellitus without complications: Secondary | ICD-10-CM

## 2016-06-29 MED ORDER — GLYBURIDE 5 MG PO TABS: 10.0000 mg | ORAL_TABLET | Freq: Two times a day (BID) | ORAL | 3 refills | Status: DC

## 2016-06-29 MED ORDER — METFORMIN HCL ER (MOD) 1000 MG PO TB24: 1000.0000 mg | ORAL_TABLET | Freq: Two times a day (BID) | ORAL | 3 refills | Status: DC

## 2016-07-11 ENCOUNTER — Encounter: Payer: Self-pay | Admitting: Physician Assistant

## 2016-07-13 MED ORDER — METFORMIN HCL ER (MOD) 1000 MG PO TB24: 1000.0000 mg | ORAL_TABLET | Freq: Two times a day (BID) | ORAL | 3 refills | Status: DC

## 2016-07-17 ENCOUNTER — Other Ambulatory Visit: Payer: Self-pay | Admitting: Emergency Medicine

## 2016-07-17 DIAGNOSIS — E119 Type 2 diabetes mellitus without complications: Secondary | ICD-10-CM

## 2016-07-17 MED ORDER — METFORMIN HCL ER (OSM) 1000 MG PO TB24: 1000.0000 mg | ORAL_TABLET | Freq: Every day | ORAL | 3 refills | Status: DC

## 2016-07-17 MED ORDER — METFORMIN HCL ER (OSM) 1000 MG PO TB24: 1000.0000 mg | ORAL_TABLET | Freq: Two times a day (BID) | ORAL | 3 refills | Status: DC

## 2016-07-20 ENCOUNTER — Other Ambulatory Visit: Payer: Self-pay | Admitting: Emergency Medicine

## 2016-07-20 MED ORDER — METFORMIN HCL ER 500 MG PO TB24: 1000.0000 mg | ORAL_TABLET | Freq: Two times a day (BID) | ORAL | 3 refills | Status: DC

## 2016-07-20 NOTE — Telephone Encounter (Signed)
Please inform patient we are checking with pharmacy to see if a PA for the new dose is needed. Should not be that expensive at all. Other option is to switch to a regular release metformin and a comparable dose to what he needs. This should be much cheaper.

## 2016-10-16 ENCOUNTER — Other Ambulatory Visit: Payer: Self-pay | Admitting: Physician Assistant

## 2016-10-16 NOTE — Telephone Encounter (Signed)
Patient is due for an appointment for recheck DM. Will call to schedule an appointment

## 2016-10-17 ENCOUNTER — Encounter: Payer: Self-pay | Admitting: Emergency Medicine

## 2016-10-17 NOTE — Telephone Encounter (Signed)
Mychart message sent to patient.

## 2016-12-03 ENCOUNTER — Other Ambulatory Visit: Payer: Self-pay | Admitting: Physician Assistant

## 2016-12-21 ENCOUNTER — Telehealth: Payer: Self-pay | Admitting: Physician Assistant

## 2016-12-21 NOTE — Telephone Encounter (Signed)
Patient has diabeetes and per guidelines needs to be seen at least every 6 months (Since his last A1C was < 8). Would they be willing to come in for repeat labs to assess sugar -- A1C and BMP? This way I can see if I need to make a change to his regimen before his appointment in November?

## 2016-12-21 NOTE — Telephone Encounter (Signed)
Routed to provider to advise

## 2016-12-21 NOTE — Telephone Encounter (Signed)
Wife calling to find out why pt Rx was not filled, explained that pt was due for an appt and that a MyChart message was sent back in July (pt has not read message). Wife states that with pt not having insurance, it makes it financially hard for him to come in every 3 to 4 months for follow ups. Pt has an appt in Nov when wife comes in for her AWV, pt does not drive and has tries to come when she has appts, please advise

## 2016-12-22 ENCOUNTER — Other Ambulatory Visit: Payer: Self-pay | Admitting: Emergency Medicine

## 2016-12-22 DIAGNOSIS — Z794 Long term (current) use of insulin: Principal | ICD-10-CM

## 2016-12-22 DIAGNOSIS — E1139 Type 2 diabetes mellitus with other diabetic ophthalmic complication: Secondary | ICD-10-CM

## 2016-12-22 MED ORDER — CITALOPRAM HYDROBROMIDE 40 MG PO TABS: 40.0000 mg | ORAL_TABLET | Freq: Every day | ORAL | 3 refills | Status: DC

## 2016-12-22 NOTE — Telephone Encounter (Signed)
LMOVM on wife number that we can schedule a lab visit for rechecking sugars and we can make any changes by lab results.

## 2016-12-22 NOTE — Telephone Encounter (Signed)
Spoke with wife. Scheduled a lab visit and refilled the Citalopram to the [facility]

## 2016-12-22 NOTE — Telephone Encounter (Signed)
LM for patient to call to discuss the notes below

## 2016-12-26 ENCOUNTER — Other Ambulatory Visit (INDEPENDENT_AMBULATORY_CARE_PROVIDER_SITE_OTHER): Payer: Self-pay

## 2016-12-26 DIAGNOSIS — J019 Acute sinusitis, unspecified: Secondary | ICD-10-CM

## 2016-12-26 DIAGNOSIS — Z794 Long term (current) use of insulin: Secondary | ICD-10-CM

## 2016-12-26 DIAGNOSIS — B9689 Other specified bacterial agents as the cause of diseases classified elsewhere: Secondary | ICD-10-CM

## 2016-12-26 DIAGNOSIS — E1139 Type 2 diabetes mellitus with other diabetic ophthalmic complication: Secondary | ICD-10-CM

## 2016-12-26 DIAGNOSIS — E119 Type 2 diabetes mellitus without complications: Secondary | ICD-10-CM

## 2016-12-26 LAB — LIPID PANEL
CHOLESTEROL: 181 mg/dL (ref 0–200)
HDL: 35.9 mg/dL — ABNORMAL LOW (ref >39.00)
LDL CALC: 114 mg/dL — AB (ref 0–99)
NonHDL: 145.44
TRIGLYCERIDES: 155 mg/dL — AB (ref 0.0–149.0)
Total CHOL/HDL Ratio: 5
VLDL: 31 mg/dL (ref 0.0–40.0)

## 2016-12-26 LAB — BASIC METABOLIC PANEL
BUN: 11 mg/dL (ref 6–23)
CHLORIDE: 102 meq/L (ref 96–112)
CO2: 33 meq/L — AB (ref 19–32)
Calcium: 9.2 mg/dL (ref 8.4–10.5)
Creatinine, Ser: 0.85 mg/dL (ref 0.40–1.50)
GFR: 97.04 mL/min (ref >60.00)
GLUCOSE: 162 mg/dL — AB (ref 70–99)
Potassium: 4.2 mEq/L (ref 3.5–5.1)
SODIUM: 139 meq/L (ref 135–145)

## 2016-12-26 LAB — HEMOGLOBIN A1C: Hgb A1c MFr Bld: 6.6 % — ABNORMAL HIGH (ref 4.6–6.5)

## 2017-02-05 ENCOUNTER — Other Ambulatory Visit: Payer: Self-pay | Admitting: Physician Assistant

## 2017-02-05 DIAGNOSIS — E119 Type 2 diabetes mellitus without complications: Secondary | ICD-10-CM

## 2017-03-01 ENCOUNTER — Encounter: Payer: Self-pay | Admitting: Physician Assistant

## 2017-03-01 ENCOUNTER — Ambulatory Visit: Payer: Self-pay | Admitting: Physician Assistant

## 2017-03-01 VITALS — BP 138/84 | HR 68 | Temp 98.4°F | Ht 72.0 in | Wt 196.0 lb

## 2017-03-01 DIAGNOSIS — M67912 Unspecified disorder of synovium and tendon, left shoulder: Secondary | ICD-10-CM

## 2017-03-01 DIAGNOSIS — Z23 Encounter for immunization: Secondary | ICD-10-CM

## 2017-03-01 DIAGNOSIS — E1169 Type 2 diabetes mellitus with other specified complication: Secondary | ICD-10-CM

## 2017-03-01 DIAGNOSIS — E785 Hyperlipidemia, unspecified: Secondary | ICD-10-CM | POA: Insufficient documentation

## 2017-03-01 DIAGNOSIS — E119 Type 2 diabetes mellitus without complications: Secondary | ICD-10-CM

## 2017-03-01 HISTORY — DX: Unspecified disorder of synovium and tendon, left shoulder: M67.912

## 2017-03-01 LAB — COMPREHENSIVE METABOLIC PANEL
ALBUMIN: 4.8 g/dL (ref 3.5–5.2)
ALK PHOS: 78 U/L (ref 39–117)
ALT: 31 U/L (ref 0–53)
AST: 24 U/L (ref 0–37)
BUN: 15 mg/dL (ref 6–23)
CALCIUM: 10 mg/dL (ref 8.4–10.5)
CO2: 31 mEq/L (ref 19–32)
Chloride: 98 mEq/L (ref 96–112)
Creatinine, Ser: 0.97 mg/dL (ref 0.40–1.50)
GFR: 83.28 mL/min (ref >60.00)
Glucose, Bld: 233 mg/dL — ABNORMAL HIGH (ref 70–99)
POTASSIUM: 4.2 meq/L (ref 3.5–5.1)
Sodium: 136 mEq/L (ref 135–145)
TOTAL PROTEIN: 7.8 g/dL (ref 6.0–8.3)
Total Bilirubin: 1 mg/dL (ref 0.2–1.2)

## 2017-03-01 LAB — LIPID PANEL
CHOLESTEROL: 205 mg/dL — AB (ref 0–200)
HDL: 36.7 mg/dL — AB (ref >39.00)
LDL Cholesterol: 136 mg/dL — ABNORMAL HIGH (ref 0–99)
NonHDL: 168.2
TRIGLYCERIDES: 163 mg/dL — AB (ref 0.0–149.0)
Total CHOL/HDL Ratio: 6
VLDL: 32.6 mg/dL (ref 0.0–40.0)

## 2017-03-01 MED ORDER — MELOXICAM 15 MG PO TABS: 15.0000 mg | ORAL_TABLET | Freq: Every day | ORAL | 0 refills | Status: DC

## 2017-03-01 NOTE — Assessment & Plan Note (Signed)
Last A1C improved. Has cut back metformin since. Will go back up to 1000 mg night time dose. Continue the Diabeta as directed. Will attempt to get Ophthalmology report.

## 2017-03-01 NOTE — Progress Notes (Signed)
Patient presents to clinic today for follow-up of DM II and Hyperlipidemia.  Patient also with acute concerns today.  Diabetes Mellitus II -- Currently on a regimen of Metformin XR 1000 mg BID and diabetes 10 mg BID. Is taking Diabeta as directed. Is only taking Metformin 500 mg XR twice daily. States increased morning dose was causing loose stools. No issue with nighttime dose. Is up-to-date on eye examination. Fasting sugars are averaging 150s currently.   Hyperlipidemia -- Not currently on statin, was trying a 58-month trial of TLC. Is fasting for repeat labs today. Stays active at work but no regular exercise.   Patient also endorses pain in left shoulder with ROM x 2-3 months. Denies known trauma or injury. Denies weakness. Denies numbness/ringling. Denies piror injury to the area.   Past Medical History:  Diagnosis Date  . Diabetes mellitus, type 2 ([place]) 1988  . Glaucoma   . Juvenile cataract of both eyes     Current Outpatient Medications on File Prior to Visit  Medication Sig Dispense Refill  . citalopram (CELEXA) 40 MG tablet Take 1 tablet (40 mg total) by mouth daily. 30 tablet 3  . glyBURIDE (DIABETA) 5 MG tablet TAKE 2 TABLETS BY MOUTH  2 TIMES DAILY WITH MEALS 120 tablet 2  . metFORMIN (GLUCOPHAGE XR) 500 MG 24 hr tablet Take 2 tablets (1,000 mg total) by mouth 2 (two) times daily with a meal. (Patient taking differently: Take 1,000 mg by mouth 2 (two) times daily with a meal. ) 120 tablet 3   No current facility-administered medications on file prior to visit.     No Known Allergies  Family History  Problem Relation Age of Onset  . Diabetes type II Father   . Heart disease Father   . Liver cancer Mother     Social History   Socioeconomic History  . Marital status: Married    Spouse name: None  . Number of children: None  . Years of education: None  . Highest education level: None  Social Needs  . Financial resource strain: None  . Food insecurity - worry:  None  . Food insecurity - inability: None  . Transportation needs - medical: None  . Transportation needs - non-medical: None  Occupational History  . None  Tobacco Use  . Smoking status: Never Smoker  . Smokeless tobacco: Never Used  Substance and Sexual Activity  . Alcohol use: Yes    Comment: Social  . Drug use: No  . Sexual activity: Yes  Other Topics Concern  . None  Social History Narrative   Part time employment.   Non-denominational pastor.   Review of Systems - See HPI.  All other ROS are negative.  BP 138/84 (BP Location: Right Arm, Patient Position: Sitting, Cuff Size: Normal)   Pulse 68   Temp 98.4 F (36.9 C) (Oral)   Ht 6' (1.829 m)   Wt 196 lb (88.9 kg)   SpO2 97%   BMI 26.58 kg/m   Physical Exam  Constitutional: He is oriented to person, place, and time and well-developed, well-nourished, and in no distress.  HENT:  Head: Normocephalic and atraumatic.  Eyes: Conjunctivae are normal.  Cardiovascular: Normal rate, regular rhythm, normal heart sounds and intact distal pulses.  Pulmonary/Chest: Effort normal and breath sounds normal. No respiratory distress. He has no wheezes. He has no rales. He exhibits no tenderness.  Musculoskeletal:       Left shoulder: He exhibits pain. He exhibits normal range of  motion and normal strength.       Cervical back: Normal.  Pain noted with internal and external rotation as well as abduction > 90 degrees. No biceps tenderness. Strength 5/5   Neurological: He is alert and oriented to person, place, and time.  Skin: Skin is warm and dry. No rash noted.  Psychiatric: Affect normal.  Vitals reviewed.  Recent Results (from the past 2160 hour(s))  Lipid panel     Status: Abnormal   Collection Time: 12/26/16  8:09 AM  Result Value Ref Range   Cholesterol 181 0 - 200 mg/dL    Comment: ATP III Classification       Desirable:  < 200 mg/dL               Borderline High:  200 - 239 mg/dL          High:  > = 240 mg/dL    Triglycerides 155.0 (H) 0.0 - 149.0 mg/dL    Comment: Normal:  <150 mg/dLBorderline High:  150 - 199 mg/dL   HDL 35.90 (L) >39.00 mg/dL   VLDL 31.0 0.0 - 40.0 mg/dL   LDL Cholesterol 114 (H) 0 - 99 mg/dL   Total CHOL/HDL Ratio 5     Comment:                Men          Women1/2 Average Risk     3.4          3.3Average Risk          5.0          4.42X Average Risk          9.6          7.13X Average Risk          15.0          11.0                       NonHDL 145.44     Comment: NOTE:  Non-HDL goal should be 30 mg/dL higher than patient's LDL goal (i.e. LDL goal of < 70 mg/dL, would have non-HDL goal of < 100 mg/dL)  Hemoglobin A1c     Status: Abnormal   Collection Time: 12/26/16  8:09 AM  Result Value Ref Range   Hgb A1c MFr Bld 6.6 (H) 4.6 - 6.5 %    Comment: Glycemic Control Guidelines for People with Diabetes:Non Diabetic:  <6%Goal of Therapy: <7%Additional Action Suggested:  >0%   Basic metabolic panel     Status: Abnormal   Collection Time: 12/26/16  8:09 AM  Result Value Ref Range   Sodium 139 135 - 145 mEq/L   Potassium 4.2 3.5 - 5.1 mEq/L   Chloride 102 96 - 112 mEq/L   CO2 33 (H) 19 - 32 mEq/L   Glucose, Bld 162 (H) 70 - 99 mg/dL   BUN 11 6 - 23 mg/dL   Creatinine, Ser 0.85 0.40 - 1.50 mg/dL   Calcium 9.2 8.4 - 10.5 mg/dL   GFR 97.04 >60.00 mL/min   Assessment/Plan: Hyperlipidemia associated with type 2 diabetes mellitus ([facility]) Repeat fasting lipids today. Will start medication if not improved with TLC.  Controlled type 2 diabetes mellitus without complication, without long-term current use of insulin ([facility]) Last A1C improved. Has cut back metformin since. Will go back up to 1000 mg night time dose. Continue the Diabeta as directed. Will attempt to get Ophthalmology report.  Tendinopathy of left rotator cuff Start Meloxicam. Supportive measures reviewed. If not improving we will need further assessment.    Leeanne Rio, PA-C

## 2017-03-01 NOTE — Assessment & Plan Note (Signed)
Start Meloxicam. Supportive measures reviewed. If not improving we will need further assessment.

## 2017-03-01 NOTE — Patient Instructions (Signed)
Please go to the lab today for blood work.  I will call you with your results. We will alter treatment regimen(s) if indicated by your results.   Please increase nighttime metformin to 1000 mg. Continue same AM dose. Please fill out paperwork so we can see about getting a medication called Trulicity covered. I will also speak with the rep about [employer] vouchers.

## 2017-03-01 NOTE — Assessment & Plan Note (Signed)
Repeat fasting lipids today. Will start medication if not improved with TLC.

## 2017-05-16 ENCOUNTER — Other Ambulatory Visit: Payer: Self-pay | Admitting: Physician Assistant

## 2017-05-16 DIAGNOSIS — E119 Type 2 diabetes mellitus without complications: Secondary | ICD-10-CM

## 2017-06-16 ENCOUNTER — Other Ambulatory Visit: Payer: Self-pay | Admitting: Physician Assistant

## 2017-10-01 ENCOUNTER — Telehealth: Payer: Self-pay | Admitting: *Deleted

## 2017-10-01 NOTE — Telephone Encounter (Signed)
Copied from [place] [id number]. Topic: Appointment Scheduling - Scheduling Inquiry for Clinic >> Oct 01, 2017 12:35 PM Vernona Rieger wrote: Reason for CRM: Patient's wife would like for him to transfer his care from [facility] to Dr Ethelene Hal. Please advise  Call back @ [phone]

## 2017-10-01 NOTE — Telephone Encounter (Signed)
Fine with me

## 2017-10-01 NOTE — Telephone Encounter (Signed)
Okay for transfer 

## 2017-10-02 NOTE — Telephone Encounter (Signed)
Yes

## 2017-10-02 NOTE — Telephone Encounter (Signed)
Okay to schedule

## 2017-10-15 ENCOUNTER — Encounter: Payer: Self-pay | Admitting: Family Medicine

## 2017-10-15 ENCOUNTER — Ambulatory Visit: Payer: Self-pay | Admitting: Family Medicine

## 2017-10-15 VITALS — BP 130/80 | HR 75 | Temp 98.1°F | Ht 72.0 in | Wt 193.5 lb

## 2017-10-15 DIAGNOSIS — F32A Depression, unspecified: Secondary | ICD-10-CM

## 2017-10-15 DIAGNOSIS — Z0000 Encounter for general adult medical examination without abnormal findings: Secondary | ICD-10-CM

## 2017-10-15 DIAGNOSIS — E119 Type 2 diabetes mellitus without complications: Secondary | ICD-10-CM

## 2017-10-15 DIAGNOSIS — F419 Anxiety disorder, unspecified: Secondary | ICD-10-CM

## 2017-10-15 DIAGNOSIS — E1139 Type 2 diabetes mellitus with other diabetic ophthalmic complication: Secondary | ICD-10-CM

## 2017-10-15 DIAGNOSIS — Z794 Long term (current) use of insulin: Secondary | ICD-10-CM

## 2017-10-15 DIAGNOSIS — Z125 Encounter for screening for malignant neoplasm of prostate: Secondary | ICD-10-CM | POA: Insufficient documentation

## 2017-10-15 DIAGNOSIS — F329 Major depressive disorder, single episode, unspecified: Secondary | ICD-10-CM

## 2017-10-15 DIAGNOSIS — E785 Hyperlipidemia, unspecified: Secondary | ICD-10-CM

## 2017-10-15 DIAGNOSIS — E1169 Type 2 diabetes mellitus with other specified complication: Secondary | ICD-10-CM

## 2017-10-15 HISTORY — DX: Encounter for screening for malignant neoplasm of prostate: Z12.5

## 2017-10-15 HISTORY — DX: Encounter for general adult medical examination without abnormal findings: Z00.00

## 2017-10-15 MED ORDER — CITALOPRAM HYDROBROMIDE 40 MG PO TABS: 40.0000 mg | ORAL_TABLET | Freq: Every day | ORAL | 2 refills | Status: DC

## 2017-10-15 MED ORDER — METFORMIN HCL ER 500 MG PO TB24: ORAL_TABLET | ORAL | 2 refills | Status: DC

## 2017-10-15 MED ORDER — DAPAGLIFLOZIN PROPANEDIOL 10 MG PO TABS: 10.0000 mg | ORAL_TABLET | Freq: Every day | ORAL | 5 refills | Status: DC

## 2017-10-15 NOTE — Progress Notes (Addendum)
Subjective:  Patient ID: Robert Bowen, male    DOB: 1955-07-26  Age: 62 y.o. MRN: [id number]  CC: Establish Care   HPI [name] presents for establishment of care.  Long-standing history of type 2 diabetes that has been treated with metformin and glyburide.  He has a history of hyperlipidemia that is been treated with dietary modification.  Long-standing history of anxiety and depression that is been treated successfully with Celexa for many years now.  He lives with his wife and they are caring for 2 granddaughters.  His wife is disabled.  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  He works as a Programme researcher, broadcasting/film/video at [employer].  He is taking [place] in the past successfully for his diabetes but was unable to afford it when he lost his job.   Outpatient Medications Prior to Visit  Medication Sig Dispense Refill  . glyBURIDE (DIABETA) 5 MG tablet take 2 tablets by mouth 2 times daily with meals 120 tablet 1  . citalopram (CELEXA) 40 MG tablet take 1 tablet by mouth daily 30 tablet 2  . metFORMIN (GLUCOPHAGE-XR) 500 MG 24 hr tablet TAKE 2 TABLETS BY MOUTH 2 TIMES DAILY WITH MEALS 120 tablet 2  . meloxicam (MOBIC) 15 MG tablet Take 1 tablet (15 mg total) by mouth daily. 15 tablet 0   No facility-administered medications prior to visit.     ROS Review of Systems  Constitutional: Negative for chills, fatigue, fever and unexpected weight change.  HENT: Negative.   Eyes: Positive for visual disturbance.  Respiratory: Negative.   Cardiovascular: Negative.   Gastrointestinal: Negative.   Endocrine: Negative for polyphagia and polyuria.  Genitourinary: Negative.   Musculoskeletal: Negative for gait problem and joint swelling.  Skin: Negative for pallor and rash.  Allergic/Immunologic: Negative for immunocompromised state.  Neurological: Negative for weakness and headaches.  Hematological: Does not bruise/bleed easily.    Psychiatric/Behavioral: Negative.     Objective:  BP 130/80   Pulse 75   Temp 98.1 F (36.7 C)   Ht 6' (1.829 m)   Wt 193 lb 8 oz (87.8 kg)   SpO2 96%   BMI 26.24 kg/m   BP Readings from Last 3 Encounters:  10/15/17 130/80  03/01/17 138/84  06/20/16 120/78    Wt Readings from Last 3 Encounters:  10/15/17 193 lb 8 oz (87.8 kg)  03/01/17 196 lb (88.9 kg)  06/20/16 193 lb (87.5 kg)    Physical Exam  Constitutional: He is oriented to person, place, and time. He appears well-developed and well-nourished.  HENT:  Head: Normocephalic and atraumatic.  Right Ear: External ear normal.  Left Ear: External ear normal.  Mouth/Throat: Oropharynx is clear and moist.  Eyes: Right eye exhibits no discharge. Left eye exhibits no discharge. No scleral icterus.  Neck: Neck supple. No JVD present. No tracheal deviation present. No thyromegaly present.  Cardiovascular: Normal rate, regular rhythm and normal heart sounds.  Pulmonary/Chest: Effort normal and breath sounds normal.  Abdominal: Bowel sounds are normal.  Lymphadenopathy:    He has no cervical adenopathy.  Neurological: He is alert and oriented to person, place, and time.  Skin: Skin is warm and dry.  Psychiatric: He has a normal mood and affect. His behavior is normal.    Lab Results  Component Value Date   WBC 6.2 10/15/2017   HGB 15.0 10/15/2017   HCT 42.0 10/15/2017   PLT  112.0 (L) 10/15/2017   GLUCOSE 171 (H) 10/15/2017   CHOL 201 (H) 10/15/2017   TRIG 179.0 (H) 10/15/2017   HDL 37.30 (L) 10/15/2017   LDLCALC 128 (H) 10/15/2017   ALT 28 10/15/2017   AST 20 10/15/2017   NA 135 10/15/2017   K 3.9 10/15/2017   CL 99 10/15/2017   CREATININE 0.99 10/15/2017   BUN 14 10/15/2017   CO2 30 10/15/2017   PSA 1.49 10/15/2017   HGBA1C 7.8 (H) 10/15/2017   MICROALBUR <0.7 10/15/2017    No results found.  Assessment & Plan:   Tyjai was seen today for establish care.  Diagnoses and all orders for this  visit:  Type 2 diabetes mellitus with other ophthalmic complication, with long-term current use of insulin (HCC) -     CBC -     Comprehensive metabolic panel -     Hemoglobin A1c -     Urinalysis, Routine w reflex microscopic -     Microalbumin / creatinine urine ratio -     metFORMIN (GLUCOPHAGE-XR) 500 MG 24 hr tablet; TAKE 2 TABLETS BY MOUTH 2 TIMES DAILY WITH MEALS -     dapagliflozin propanediol (FARXIGA) 10 MG TABS tablet; Take 10 mg by mouth daily.  Controlled type 2 diabetes mellitus without complication, without long-term current use of insulin (HCC) -     CBC -     Comprehensive metabolic panel -     Hemoglobin A1c -     Urinalysis, Routine w reflex microscopic -     Microalbumin / creatinine urine ratio -     metFORMIN (GLUCOPHAGE-XR) 500 MG 24 hr tablet; TAKE 2 TABLETS BY MOUTH 2 TIMES DAILY WITH MEALS -     dapagliflozin propanediol (FARXIGA) 10 MG TABS tablet; Take 10 mg by mouth daily.  Hyperlipidemia associated with type 2 diabetes mellitus (HCC) -     Comprehensive metabolic panel -     Lipid panel -     atorvastatin (LIPITOR) 10 MG tablet; Take 1 tablet (10 mg total) by mouth daily.  Anxiety and depression -     citalopram (CELEXA) 40 MG tablet; Take 1 tablet (40 mg total) by mouth daily.  Healthcare maintenance -     PSA   I have discontinued Bret R. Coopman's meloxicam. I have also changed his citalopram. Additionally, I am having him start on dapagliflozin propanediol and atorvastatin. Lastly, I am having him maintain his glyBURIDE and metFORMIN.  Meds ordered this encounter  Medications  . citalopram (CELEXA) 40 MG tablet    Sig: Take 1 tablet (40 mg total) by mouth daily.    Dispense:  90 tablet    Refill:  2  . metFORMIN (GLUCOPHAGE-XR) 500 MG 24 hr tablet    Sig: TAKE 2 TABLETS BY MOUTH 2 TIMES DAILY WITH MEALS    Dispense:  360 tablet    Refill:  2  . dapagliflozin propanediol (FARXIGA) 10 MG TABS tablet    Sig: Take 10 mg by mouth daily.     Dispense:  30 tablet    Refill:  5  . atorvastatin (LIPITOR) 10 MG tablet    Sig: Take 1 tablet (10 mg total) by mouth daily.    Dispense:  90 tablet    Refill:  3   We will try to maximize the dose of metformin.  Will start patient on high-dose Farxiga.  Will discontinue the glucuronide for now.  He was given a starter kit and a good Rx card  as well.  We will continue Celexa.  We will probably start a statin pending blood work results from today.  Follow-up: Return in about 3 months (around 01/15/2018).  Libby Maw, MD

## 2017-10-16 LAB — CBC
HEMATOCRIT: 42 % (ref 39.0–52.0)
HEMOGLOBIN: 15 g/dL (ref 13.0–17.0)
MCHC: 35.7 g/dL (ref 30.0–36.0)
MCV: 84.4 fl (ref 78.0–100.0)
PLATELETS: 112 10*3/uL — AB (ref 150.0–400.0)
RBC: 4.98 Mil/uL (ref 4.22–5.81)
RDW: 14.1 % (ref 11.5–15.5)
WBC: 6.2 10*3/uL (ref 4.0–10.5)

## 2017-10-16 LAB — URINALYSIS, ROUTINE W REFLEX MICROSCOPIC
Bilirubin Urine: NEGATIVE
HGB URINE DIPSTICK: NEGATIVE
Ketones, ur: NEGATIVE
LEUKOCYTES UA: NEGATIVE
Nitrite: NEGATIVE
RBC / HPF: NONE SEEN (ref 0–?)
Specific Gravity, Urine: <=1.005 — AB (ref 1.000–1.030)
Total Protein, Urine: NEGATIVE
Urine Glucose: NEGATIVE
Urobilinogen, UA: 0.2 (ref 0.0–1.0)
WBC UA: NONE SEEN (ref 0–?)
pH: 6 (ref 5.0–8.0)

## 2017-10-16 LAB — LIPID PANEL
CHOLESTEROL: 201 mg/dL — AB (ref 0–200)
HDL: 37.3 mg/dL — AB (ref >39.00)
LDL Cholesterol: 128 mg/dL — ABNORMAL HIGH (ref 0–99)
NonHDL: 164.08
TRIGLYCERIDES: 179 mg/dL — AB (ref 0.0–149.0)
Total CHOL/HDL Ratio: 5
VLDL: 35.8 mg/dL (ref 0.0–40.0)

## 2017-10-16 LAB — HEMOGLOBIN A1C: HEMOGLOBIN A1C: 7.8 % — AB (ref 4.6–6.5)

## 2017-10-16 LAB — COMPREHENSIVE METABOLIC PANEL
ALBUMIN: 4.6 g/dL (ref 3.5–5.2)
ALK PHOS: 72 U/L (ref 39–117)
ALT: 28 U/L (ref 0–53)
AST: 20 U/L (ref 0–37)
BUN: 14 mg/dL (ref 6–23)
CALCIUM: 9.4 mg/dL (ref 8.4–10.5)
CO2: 30 mEq/L (ref 19–32)
Chloride: 99 mEq/L (ref 96–112)
Creatinine, Ser: 0.99 mg/dL (ref 0.40–1.50)
GFR: 81.17 mL/min (ref >60.00)
GLUCOSE: 171 mg/dL — AB (ref 70–99)
POTASSIUM: 3.9 meq/L (ref 3.5–5.1)
Sodium: 135 mEq/L (ref 135–145)
TOTAL PROTEIN: 7.3 g/dL (ref 6.0–8.3)
Total Bilirubin: 1.1 mg/dL (ref 0.2–1.2)

## 2017-10-16 LAB — MICROALBUMIN / CREATININE URINE RATIO
Creatinine,U: 26.5 mg/dL
Microalb Creat Ratio: 2.6 mg/g (ref 0.0–30.0)

## 2017-10-16 LAB — PSA: PSA: 1.49 ng/mL (ref 0.10–4.00)

## 2017-10-17 MED ORDER — ATORVASTATIN CALCIUM 10 MG PO TABS: 10.0000 mg | ORAL_TABLET | Freq: Every day | ORAL | 3 refills | Status: DC

## 2017-10-17 NOTE — Addendum Note (Signed)
Addended by: Jon Billings on: 10/17/2017 10:18 AM   Modules accepted: Orders

## 2017-12-10 ENCOUNTER — Encounter: Payer: Self-pay | Admitting: Family Medicine

## 2017-12-10 ENCOUNTER — Ambulatory Visit: Payer: Self-pay | Admitting: Family Medicine

## 2017-12-10 ENCOUNTER — Ambulatory Visit (INDEPENDENT_AMBULATORY_CARE_PROVIDER_SITE_OTHER): Payer: Self-pay

## 2017-12-10 VITALS — BP 140/88 | HR 77 | Temp 98.3°F | Ht 72.0 in | Wt 189.4 lb

## 2017-12-10 DIAGNOSIS — S39012A Strain of muscle, fascia and tendon of lower back, initial encounter: Secondary | ICD-10-CM

## 2017-12-10 HISTORY — DX: Strain of muscle, fascia and tendon of lower back, initial encounter: S39.012A

## 2017-12-10 MED ORDER — METHOCARBAMOL 500 MG PO TABS: 500.0000 mg | ORAL_TABLET | Freq: Three times a day (TID) | ORAL | 1 refills | Status: DC

## 2017-12-10 MED ORDER — MELOXICAM 15 MG PO TABS: 15.0000 mg | ORAL_TABLET | Freq: Every day | ORAL | 0 refills | Status: DC

## 2017-12-10 NOTE — Patient Instructions (Signed)

## 2017-12-10 NOTE — Progress Notes (Signed)
Subjective:  Patient ID: Robert Bowen, male    DOB: 02-26-1956  Age: 62 y.o. MRN: [id number]  CC: Back Pain (started 10 days ago after a fall.)   HPI Robert Bowen presents for treatment and evaluation of lower back pain that is persisted over the last 10 days.  It was immediately precipitated by a fall.  He was working on his house and walked back backwards and tripped over a head drug he landed flat on his back.  He has been experiencing pain in his mid lower back.  There is no radiation.  There is no bowel or bladder incontinence.  There is no weakness numbness or tingling.  He continues to work as a Horticulturist, commercial.  He is tried OTC ibuprofen without much relief.  Outpatient Medications Prior to Visit  Medication Sig Dispense Refill  . citalopram (CELEXA) 40 MG tablet Take 1 tablet (40 mg total) by mouth daily. 90 tablet 2  . dapagliflozin propanediol (FARXIGA) 10 MG TABS tablet Take 10 mg by mouth daily. 30 tablet 5  . metFORMIN (GLUCOPHAGE-XR) 500 MG 24 hr tablet TAKE 2 TABLETS BY MOUTH 2 TIMES DAILY WITH MEALS 360 tablet 2  . atorvastatin (LIPITOR) 10 MG tablet Take 1 tablet (10 mg total) by mouth daily. (Patient not taking: Reported on 12/10/2017) 90 tablet 3  . dapagliflozin propanediol (FARXIGA) 5 MG TABS tablet Take by mouth.    . glyBURIDE (DIABETA) 5 MG tablet take 2 tablets by mouth 2 times daily with meals (Patient not taking: Reported on 12/10/2017) 120 tablet 1   No facility-administered medications prior to visit.     ROS Review of Systems  Constitutional: Negative for chills, diaphoresis, fatigue, fever and unexpected weight change.  Respiratory: Negative.   Cardiovascular: Negative.   Gastrointestinal: Negative.   Genitourinary: Negative.   Musculoskeletal: Positive for back pain. Negative for myalgias.  Skin: Negative.   Neurological: Negative for weakness and numbness.  Psychiatric/Behavioral: Negative.     Objective:  BP 140/88 (BP Location: Left Arm, Patient  Position: Sitting, Cuff Size: Normal)   Pulse 77   Temp 98.3 F (36.8 C) (Oral)   Ht 6' (1.829 m)   Wt 189 lb 6.4 oz (85.9 kg)   SpO2 98%   BMI 25.69 kg/m   BP Readings from Last 3 Encounters:  12/10/17 140/88  10/15/17 130/80  03/01/17 138/84    Wt Readings from Last 3 Encounters:  12/10/17 189 lb 6.4 oz (85.9 kg)  10/15/17 193 lb 8 oz (87.8 kg)  03/01/17 196 lb (88.9 kg)    Physical Exam  Constitutional: He is oriented to person, place, and time. He appears well-developed and well-nourished. No distress.  HENT:  Head: Normocephalic and atraumatic.  Right Ear: External ear normal.  Left Ear: External ear normal.  Eyes: Right eye exhibits no discharge. Left eye exhibits no discharge. No scleral icterus.  Neck: No tracheal deviation present.  Pulmonary/Chest: Effort normal.  Neurological: He is alert and oriented to person, place, and time.  Skin: He is not diaphoretic.    Lab Results  Component Value Date   WBC 6.2 10/15/2017   HGB 15.0 10/15/2017   HCT 42.0 10/15/2017   PLT 112.0 (L) 10/15/2017   GLUCOSE 171 (H) 10/15/2017   CHOL 201 (H) 10/15/2017   TRIG 179.0 (H) 10/15/2017   HDL 37.30 (L) 10/15/2017   LDLCALC 128 (H) 10/15/2017   ALT 28 10/15/2017   AST 20 10/15/2017   NA 135 10/15/2017  K 3.9 10/15/2017   CL 99 10/15/2017   CREATININE 0.99 10/15/2017   BUN 14 10/15/2017   CO2 30 10/15/2017   PSA 1.49 10/15/2017   HGBA1C 7.8 (H) 10/15/2017   MICROALBUR <0.7 10/15/2017    No results found.  Assessment & Plan:   Robert Bowen was seen today for back pain.  Diagnoses and all orders for this visit:  Strain of lumbar region, initial encounter -     DG Lumbar Spine Complete; Future -     meloxicam (MOBIC) 15 MG tablet; Take 1 tablet (15 mg total) by mouth daily. For 10 days and then as needed. -     methocarbamol (ROBAXIN) 500 MG tablet; Take 1 tablet (500 mg total) by mouth 3 (three) times daily. For 10 days and then as needed.   I am having Robert Bowen start on meloxicam and methocarbamol. I am also having him maintain his glyBURIDE, citalopram, metFORMIN, dapagliflozin propanediol, atorvastatin, and dapagliflozin propanediol.  Meds ordered this encounter  Medications  . meloxicam (MOBIC) 15 MG tablet    Sig: Take 1 tablet (15 mg total) by mouth daily. For 10 days and then as needed.    Dispense:  30 tablet    Refill:  0  . methocarbamol (ROBAXIN) 500 MG tablet    Sig: Take 1 tablet (500 mg total) by mouth 3 (three) times daily. For 10 days and then as needed.    Dispense:  60 tablet    Refill:  1   Patient will be sure to take the Mobic with food and let me know if there is any stomach upset or melena.  He has not been taking the Lipitor.  He says his wife is nervous about him taking it.  Stressed the importance of taking a statin with diabetes because of elevated risk of coronary artery disease.  He said that he would take that under advisement and consider taking a statin.  Follow-up: Return take mobic with food. follow up immediately if your bm tuns black. Libby Maw, MD

## 2017-12-12 ENCOUNTER — Encounter: Payer: Self-pay | Admitting: Family Medicine

## 2017-12-12 ENCOUNTER — Telehealth: Payer: Self-pay

## 2017-12-12 NOTE — Telephone Encounter (Signed)
Copied from [place] [id number]. Topic: General - Other >> Dec 12, 2017  8:41 AM Sheran Luz wrote: Reason for CRM: Pt called stating that he was seen 9/9 for back pain. Pt states that his back is still bothering him so he is going to stay home from work today, 9/11. Pt would like to know if he could get a note for work, if possible. Pt is requesting a call back when/if ready so he can pick up from office.   [phone]

## 2017-12-12 NOTE — Telephone Encounter (Signed)
Spoke to pt and informed him note is up front for him to pick up

## 2017-12-12 NOTE — Telephone Encounter (Signed)
Okay to give him a note.

## 2018-01-15 ENCOUNTER — Ambulatory Visit: Payer: Self-pay | Admitting: Family Medicine

## 2018-01-15 ENCOUNTER — Encounter: Payer: Self-pay | Admitting: Family Medicine

## 2018-01-15 VITALS — BP 142/78 | HR 71 | Temp 99.6°F | Ht 72.0 in | Wt 186.1 lb

## 2018-01-15 DIAGNOSIS — F324 Major depressive disorder, single episode, in partial remission: Secondary | ICD-10-CM

## 2018-01-15 DIAGNOSIS — E1169 Type 2 diabetes mellitus with other specified complication: Secondary | ICD-10-CM

## 2018-01-15 DIAGNOSIS — E119 Type 2 diabetes mellitus without complications: Secondary | ICD-10-CM

## 2018-01-15 DIAGNOSIS — E1139 Type 2 diabetes mellitus with other diabetic ophthalmic complication: Secondary | ICD-10-CM

## 2018-01-15 DIAGNOSIS — F419 Anxiety disorder, unspecified: Secondary | ICD-10-CM

## 2018-01-15 DIAGNOSIS — R030 Elevated blood-pressure reading, without diagnosis of hypertension: Secondary | ICD-10-CM

## 2018-01-15 DIAGNOSIS — E785 Hyperlipidemia, unspecified: Secondary | ICD-10-CM

## 2018-01-15 DIAGNOSIS — F329 Major depressive disorder, single episode, unspecified: Secondary | ICD-10-CM

## 2018-01-15 DIAGNOSIS — Z794 Long term (current) use of insulin: Secondary | ICD-10-CM

## 2018-01-15 DIAGNOSIS — Z23 Encounter for immunization: Secondary | ICD-10-CM

## 2018-01-15 HISTORY — DX: Elevated blood-pressure reading, without diagnosis of hypertension: R03.0

## 2018-01-15 HISTORY — DX: Major depressive disorder, single episode, in partial remission: F32.4

## 2018-01-15 LAB — URINALYSIS, ROUTINE W REFLEX MICROSCOPIC
BILIRUBIN URINE: NEGATIVE
Hgb urine dipstick: NEGATIVE
KETONES UR: NEGATIVE
Leukocytes, UA: NEGATIVE
NITRITE: NEGATIVE
PH: 7 (ref 5.0–8.0)
RBC / HPF: NONE SEEN (ref 0–?)
Specific Gravity, Urine: <=1.005 — AB (ref 1.000–1.030)
Total Protein, Urine: NEGATIVE
UROBILINOGEN UA: 0.2 (ref 0.0–1.0)
Urine Glucose: >=1000 — AB
WBC UA: NONE SEEN (ref 0–?)

## 2018-01-15 LAB — BASIC METABOLIC PANEL
BUN: 18 mg/dL (ref 6–23)
CALCIUM: 10 mg/dL (ref 8.4–10.5)
CO2: 30 meq/L (ref 19–32)
CREATININE: 0.93 mg/dL (ref 0.40–1.50)
Chloride: 99 mEq/L (ref 96–112)
GFR: 87.18 mL/min (ref >60.00)
GLUCOSE: 209 mg/dL — AB (ref 70–99)
Potassium: 4.5 mEq/L (ref 3.5–5.1)
Sodium: 136 mEq/L (ref 135–145)

## 2018-01-15 LAB — CBC
HCT: 45.3 % (ref 39.0–52.0)
HEMOGLOBIN: 16 g/dL (ref 13.0–17.0)
MCHC: 35.4 g/dL (ref 30.0–36.0)
MCV: 84.2 fl (ref 78.0–100.0)
PLATELETS: 105 10*3/uL — AB (ref 150.0–400.0)
RBC: 5.38 Mil/uL (ref 4.22–5.81)
RDW: 13.7 % (ref 11.5–15.5)
WBC: 5 10*3/uL (ref 4.0–10.5)

## 2018-01-15 LAB — LIPID PANEL
Cholesterol: 130 mg/dL (ref 0–200)
HDL: 39.7 mg/dL (ref >39.00)
LDL CALC: 71 mg/dL (ref 0–99)
NonHDL: 90.16
TRIGLYCERIDES: 96 mg/dL (ref 0.0–149.0)
Total CHOL/HDL Ratio: 3
VLDL: 19.2 mg/dL (ref 0.0–40.0)

## 2018-01-15 LAB — HEMOGLOBIN A1C: Hgb A1c MFr Bld: 8.1 % — ABNORMAL HIGH (ref 4.6–6.5)

## 2018-01-15 MED ORDER — METFORMIN HCL ER 500 MG PO TB24: ORAL_TABLET | ORAL | 2 refills | Status: DC

## 2018-01-15 MED ORDER — DAPAGLIFLOZIN PROPANEDIOL 10 MG PO TABS: 10.0000 mg | ORAL_TABLET | Freq: Every day | ORAL | 2 refills | Status: DC

## 2018-01-15 MED ORDER — CITALOPRAM HYDROBROMIDE 40 MG PO TABS: 40.0000 mg | ORAL_TABLET | Freq: Every day | ORAL | 2 refills | Status: DC

## 2018-01-15 MED ORDER — ATORVASTATIN CALCIUM 10 MG PO TABS: 10.0000 mg | ORAL_TABLET | Freq: Every day | ORAL | 3 refills | Status: DC

## 2018-01-15 MED ORDER — LOSARTAN POTASSIUM 50 MG PO TABS: 50.0000 mg | ORAL_TABLET | Freq: Every day | ORAL | 3 refills | Status: DC

## 2018-01-15 NOTE — Progress Notes (Addendum)
Subjective:  Patient ID: Robert Bowen, male    DOB: 1955/11/24  Age: 62 y.o. MRN: [id number]  CC: Follow-up   HPI SID GREENER presents for follow-up of his diabetes hyperlipidemia and elevated blood pressure.  He has actually taken lisinopril in the past for his blood pressure and developed bad cough with that and it was discontinued.  He is actually been able to lose a little bit of weight and has avoided sodium in his diet for quite some time now.  He only drinks alcohol on rare occasion.  He tries to take the metformin at thousand milligrams twice daily but sometimes has GI side effects with it.  He is taking the [place] without issue.  The company is applying this medication to him for free.  He is taking atorvastatin without issue.  Is no longer taking glyburide.  His back is improved and he is no longer requiring the meloxicam or the Robaxin.  There is some stress in his house right now.  He and his wife are caring for his daughter's children.  He is able to walk 6-10,000 steps at his job.  Outpatient Medications Prior to Visit  Medication Sig Dispense Refill  . atorvastatin (LIPITOR) 10 MG tablet Take 1 tablet (10 mg total) by mouth daily. 90 tablet 3  . citalopram (CELEXA) 40 MG tablet Take 1 tablet (40 mg total) by mouth daily. 90 tablet 2  . dapagliflozin propanediol (FARXIGA) 10 MG TABS tablet Take 10 mg by mouth daily. 30 tablet 5  . metFORMIN (GLUCOPHAGE-XR) 500 MG 24 hr tablet TAKE 2 TABLETS BY MOUTH 2 TIMES DAILY WITH MEALS 360 tablet 2  . dapagliflozin propanediol (FARXIGA) 5 MG TABS tablet Take by mouth.    . glyBURIDE (DIABETA) 5 MG tablet take 2 tablets by mouth 2 times daily with meals (Patient not taking: Reported on 12/10/2017) 120 tablet 1  . meloxicam (MOBIC) 15 MG tablet Take 1 tablet (15 mg total) by mouth daily. For 10 days and then as needed. 30 tablet 0  . methocarbamol (ROBAXIN) 500 MG tablet Take 1 tablet (500 mg total) by mouth 3 (three) times daily. For 10  days and then as needed. 60 tablet 1   No facility-administered medications prior to visit.     ROS Review of Systems  Constitutional: Negative for diaphoresis, fatigue, fever and unexpected weight change.  HENT: Negative.   Eyes: Negative for photophobia.  Respiratory: Negative.   Cardiovascular: Negative.   Gastrointestinal: Negative.   Endocrine: Negative for polyphagia and polyuria.  Genitourinary: Negative.   Musculoskeletal: Negative for back pain and gait problem.  Allergic/Immunologic: Negative for immunocompromised state.  Neurological: Negative for weakness and headaches.  Hematological: Does not bruise/bleed easily.  Psychiatric/Behavioral: Negative.    Depression screen [facility] 2/9 01/15/2018 10/15/2017 03/01/2016  Decreased Interest 1 0 0  Down, Depressed, Hopeless 1 0 0  PHQ - 2 Score 2 0 0  Altered sleeping 0 0 0  Tired, decreased energy 0 1 0  Change in appetite 0 0 0  Feeling bad or failure about yourself  0 0 0  Trouble concentrating 1 1 0  Moving slowly or fidgety/restless 0 0 0  Suicidal thoughts 0 0 0  PHQ-9 Score 3 2 0    Objective:  BP (!) 142/78   Pulse 71   Temp 99.6 F (37.6 C) (Oral)   Ht 6' (1.829 m)   Wt 186 lb 2 oz (84.4 kg)   SpO2 98%  BMI 25.24 kg/m   BP Readings from Last 3 Encounters:  04/08/18 130/74  01/15/18 (!) 142/78  12/10/17 140/88    Wt Readings from Last 3 Encounters:  04/08/18 182 lb 12.8 oz (82.9 kg)  01/15/18 186 lb 2 oz (84.4 kg)  12/10/17 189 lb 6.4 oz (85.9 kg)    Physical Exam  Lab Results  Component Value Date   WBC 5.0 01/15/2018   HGB 16.0 01/15/2018   HCT 45.3 01/15/2018   PLT 105.0 (L) 01/15/2018   GLUCOSE 209 (H) 01/15/2018   CHOL 130 01/15/2018   TRIG 96.0 01/15/2018   HDL 39.70 01/15/2018   LDLCALC 71 01/15/2018   ALT 28 10/15/2017   AST 20 10/15/2017   NA 136 01/15/2018   K 4.5 01/15/2018   CL 99 01/15/2018   CREATININE 0.93 01/15/2018   BUN 18 01/15/2018   CO2 30 01/15/2018   PSA  1.49 10/15/2017   HGBA1C 8.1 (H) 01/15/2018   MICROALBUR <0.7 10/15/2017    No results found.  Assessment & Plan:   Nehemyah was seen today for follow-up.  Diagnoses and all orders for this visit:  Hyperlipidemia associated with type 2 diabetes mellitus ([place]) -     atorvastatin (LIPITOR) 10 MG tablet; Take 1 tablet (10 mg total) by mouth daily. -     Lipid panel  Type 2 diabetes mellitus with other ophthalmic complication, with long-term current use of insulin (HCC) -     metFORMIN (GLUCOPHAGE-XR) 500 MG 24 hr tablet; TAKE 2 TABLETS BY MOUTH 2 TIMES DAILY -     Discontinue: dapagliflozin propanediol (FARXIGA) 10 MG TABS tablet; Take 10 mg by mouth daily. -     CBC -     Basic metabolic panel -     Hemoglobin A1c -     Urinalysis, Routine w reflex microscopic -     losartan (COZAAR) 50 MG tablet; Take 1 tablet (50 mg total) by mouth daily. -     Discontinue: dapagliflozin propanediol (FARXIGA) 10 MG TABS tablet; Take 10 mg by mouth daily. -     dapagliflozin propanediol (FARXIGA) 10 MG TABS tablet; Take 10 mg by mouth daily.  Controlled type 2 diabetes mellitus without complication, without long-term current use of insulin (HCC) -     metFORMIN (GLUCOPHAGE-XR) 500 MG 24 hr tablet; TAKE 2 TABLETS BY MOUTH 2 TIMES DAILY -     Discontinue: dapagliflozin propanediol (FARXIGA) 10 MG TABS tablet; Take 10 mg by mouth daily. -     CBC -     Basic metabolic panel -     Hemoglobin A1c -     Urinalysis, Routine w reflex microscopic -     losartan (COZAAR) 50 MG tablet; Take 1 tablet (50 mg total) by mouth daily. -     Discontinue: dapagliflozin propanediol (FARXIGA) 10 MG TABS tablet; Take 10 mg by mouth daily. -     dapagliflozin propanediol (FARXIGA) 10 MG TABS tablet; Take 10 mg by mouth daily.  Elevated BP without diagnosis of hypertension -     CBC -     Basic metabolic panel -     Urinalysis, Routine w reflex microscopic -     losartan (COZAAR) 50 MG tablet; Take 1 tablet (50 mg  total) by mouth daily.  Anxiety and depression -     citalopram (CELEXA) 40 MG tablet; Take 1 tablet (40 mg total) by mouth daily.  Need for influenza vaccination -     Flu Vaccine  QUAD 36+ mos IM  Other orders -     Discontinue: levothyroxine (SYNTHROID, LEVOTHROID) 50 MCG tablet; Take 1 tablet (50 mcg total) by mouth daily. Take one hour before morning meal on a fasting stomach.   I have discontinued Bunyan R. Leibold's glyBURIDE, meloxicam, methocarbamol, and levothyroxine. I have also changed his metFORMIN. Additionally, I am having him start on losartan. Lastly, I am having him maintain his atorvastatin, citalopram, and dapagliflozin propanediol.  Meds ordered this encounter  Medications  . metFORMIN (GLUCOPHAGE-XR) 500 MG 24 hr tablet    Sig: TAKE 2 TABLETS BY MOUTH 2 TIMES DAILY    Dispense:  360 tablet    Refill:  2  . atorvastatin (LIPITOR) 10 MG tablet    Sig: Take 1 tablet (10 mg total) by mouth daily.    Dispense:  90 tablet    Refill:  3  . citalopram (CELEXA) 40 MG tablet    Sig: Take 1 tablet (40 mg total) by mouth daily.    Dispense:  90 tablet    Refill:  2  . DISCONTD: dapagliflozin propanediol (FARXIGA) 10 MG TABS tablet    Sig: Take 10 mg by mouth daily.    Dispense:  90 tablet    Refill:  2  . losartan (COZAAR) 50 MG tablet    Sig: Take 1 tablet (50 mg total) by mouth daily.    Dispense:  90 tablet    Refill:  3  . DISCONTD: dapagliflozin propanediol (FARXIGA) 10 MG TABS tablet    Sig: Take 10 mg by mouth daily.    Dispense:  90 tablet    Refill:  2  . DISCONTD: levothyroxine (SYNTHROID, LEVOTHROID) 50 MCG tablet    Sig: Take 1 tablet (50 mcg total) by mouth daily. Take one hour before morning meal on a fasting stomach.    Dispense:  90 tablet    Refill:  1  . dapagliflozin propanediol (FARXIGA) 10 MG TABS tablet    Sig: Take 10 mg by mouth daily.    Dispense:  30 tablet    Refill:  0    Patient is due for follow up.   Continue all medicines as  directed.  Encouraged continued physical activity.  Follow-up in 3 months.  Follow-up: Return in about 3 months (around 04/17/2018).  Libby Maw, MD

## 2018-01-17 DIAGNOSIS — Z23 Encounter for immunization: Secondary | ICD-10-CM

## 2018-01-17 DIAGNOSIS — E039 Hypothyroidism, unspecified: Secondary | ICD-10-CM | POA: Insufficient documentation

## 2018-01-17 HISTORY — DX: Encounter for immunization: Z23

## 2018-01-17 MED ORDER — LEVOTHYROXINE SODIUM 50 MCG PO TABS: 50.0000 ug | ORAL_TABLET | Freq: Every day | ORAL | 1 refills | Status: DC

## 2018-01-17 NOTE — Addendum Note (Signed)
Addended by: Jon Billings on: 01/17/2018 03:11 PM   Modules accepted: Orders

## 2018-01-17 NOTE — Addendum Note (Signed)
Addended by: Jon Billings on: 01/17/2018 03:04 PM   Modules accepted: Orders

## 2018-03-04 DIAGNOSIS — H44521 Atrophy of globe, right eye: Secondary | ICD-10-CM | POA: Insufficient documentation

## 2018-03-04 DIAGNOSIS — H1820 Unspecified corneal edema: Secondary | ICD-10-CM | POA: Insufficient documentation

## 2018-03-04 DIAGNOSIS — H3581 Retinal edema: Secondary | ICD-10-CM | POA: Insufficient documentation

## 2018-03-04 HISTORY — DX: Atrophy of globe, right eye: H44.521

## 2018-03-04 HISTORY — DX: Retinal edema: H35.81

## 2018-04-08 ENCOUNTER — Encounter: Payer: Self-pay | Admitting: Nurse Practitioner

## 2018-04-08 ENCOUNTER — Ambulatory Visit: Payer: Self-pay | Admitting: Nurse Practitioner

## 2018-04-08 VITALS — BP 130/74 | HR 84 | Temp 99.3°F | Ht 72.0 in | Wt 182.8 lb

## 2018-04-08 DIAGNOSIS — J00 Acute nasopharyngitis [common cold]: Secondary | ICD-10-CM

## 2018-04-08 LAB — POC INFLUENZA A&B (BINAX/QUICKVUE)
INFLUENZA A, POC: NEGATIVE
Influenza B, POC: NEGATIVE

## 2018-04-08 MED ORDER — CHLORPHEN-PE-ACETAMINOPHEN 4-10-325 MG PO TABS: 1.0000 | ORAL_TABLET | Freq: Two times a day (BID) | ORAL | 0 refills | Status: AC

## 2018-04-08 MED ORDER — GUAIFENESIN ER 600 MG PO TB12: 600.0000 mg | ORAL_TABLET | Freq: Two times a day (BID) | ORAL | 0 refills | Status: DC | PRN

## 2018-04-08 MED ORDER — BENZONATATE 100 MG PO CAPS: 100.0000 mg | ORAL_CAPSULE | Freq: Three times a day (TID) | ORAL | 0 refills | Status: DC | PRN

## 2018-04-08 MED ORDER — DAPAGLIFLOZIN PROPANEDIOL 10 MG PO TABS: 10.0000 mg | ORAL_TABLET | Freq: Every day | ORAL | 0 refills | Status: DC

## 2018-04-08 NOTE — Patient Instructions (Addendum)
Maintain adequate oral hydration.  Call office if no improvement by wednesday.  Upper Respiratory Infection, Adult An upper respiratory infection (URI) affects the nose, throat, and upper air passages. URIs are caused by germs (viruses). The most common type of URI is often called "the common cold." Medicines cannot cure URIs, but you can do things at home to relieve your symptoms. URIs usually get better within 7-10 days. Follow these instructions at home: Activity  Rest as needed.  If you have a fever, stay home from work or school until your fever is gone, or until your doctor says you may return to work or school. ? You should stay home until you cannot spread the infection anymore (you are not contagious). ? Your doctor may have you wear a face mask so you have less risk of spreading the infection. Relieving symptoms  Gargle with a salt-water mixture 3-4 times a day or as needed. To make a salt-water mixture, completely dissolve -1 tsp of salt in 1 cup of warm water.  Use a cool-mist humidifier to add moisture to the air. This can help you breathe more easily. Eating and drinking   Drink enough fluid to keep your pee (urine) pale yellow.  Eat soups and other clear broths. General instructions   Take over-the-counter and prescription medicines only as told by your doctor. These include cold medicines, fever reducers, and cough suppressants.  Do not use any products that contain nicotine or tobacco. These include cigarettes and e-cigarettes. If you need help quitting, ask your doctor.  Avoid being where people are smoking (avoid secondhand smoke).  Make sure you get regular shots and get the flu shot every year.  Keep all follow-up visits as told by your doctor. This is important. How to avoid spreading infection to others   Wash your hands often with soap and water. If you do not have soap and water, use hand sanitizer.  Avoid touching your mouth, face, eyes, or  nose.  Cough or sneeze into a tissue or your sleeve or elbow. Do not cough or sneeze into your hand or into the air. Contact a doctor if:  You are getting worse, not better.  You have any of these: ? A fever. ? Chills. ? Brown or red mucus in your nose. ? Yellow or brown fluid (discharge)coming from your nose. ? Pain in your face, especially when you bend forward. ? Swollen neck glands. ? Pain with swallowing. ? White areas in the back of your throat. Get help right away if:  You have shortness of breath that gets worse.  You have very bad or constant: ? Headache. ? Ear pain. ? Pain in your forehead, behind your eyes, and over your cheekbones (sinus pain). ? Chest pain.  You have long-lasting (chronic) lung disease along with any of these: ? Wheezing. ? Long-lasting cough. ? Coughing up blood. ? A change in your usual mucus.  You have a stiff neck.  You have changes in your: ? Vision. ? Hearing. ? Thinking. ? Mood. Summary  An upper respiratory infection (URI) is caused by a germ called a virus. The most common type of URI is often called "the common cold."  URIs usually get better within 7-10 days.  Take over-the-counter and prescription medicines only as told by your doctor. This information is not intended to replace advice given to you by your health care provider. Make sure you discuss any questions you have with your health care provider. Document Released: 09/06/2007 Document  Revised: 11/10/2016 Document Reviewed: 11/10/2016 Elsevier Interactive Patient Education  [employer].

## 2018-04-08 NOTE — Progress Notes (Signed)
Subjective:  Patient ID: Robert Bowen, male    DOB: November 23, 1955  Age: 63 y.o. MRN: [id number]  CC: Cough (productive thick flim, headache, fatigued, on and off fever 99.7, congestion. started x4 days, OTC nyquil/dayquil)   URI   This is a new problem. The current episode started in the past 7 days. The problem has been unchanged. The fever has been present for 3 to 4 days. Associated symptoms include congestion, coughing, headaches, rhinorrhea, sinus pain and a sore throat. Pertinent negatives include no abdominal pain, chest pain, diarrhea, dysuria, ear pain, joint pain, joint swelling, nausea, neck pain, plugged ear sensation, rash, sneezing, swollen glands, vomiting or wheezing. He has tried decongestant, acetaminophen and NSAIDs for the symptoms. The treatment provided no relief.  flu vaccine administered 01/2018.  Reviewed past Medical, Social and Family history today.  Outpatient Medications Prior to Visit  Medication Sig Dispense Refill  . atorvastatin (LIPITOR) 10 MG tablet Take 1 tablet (10 mg total) by mouth daily. 90 tablet 3  . citalopram (CELEXA) 40 MG tablet Take 1 tablet (40 mg total) by mouth daily. 90 tablet 2  . losartan (COZAAR) 50 MG tablet Take 1 tablet (50 mg total) by mouth daily. 90 tablet 3  . metFORMIN (GLUCOPHAGE-XR) 500 MG 24 hr tablet TAKE 2 TABLETS BY MOUTH 2 TIMES DAILY 360 tablet 2  . dapagliflozin propanediol (FARXIGA) 10 MG TABS tablet Take 10 mg by mouth daily. 90 tablet 2   No facility-administered medications prior to visit.     ROS See HPI  Objective:  BP 130/74   Pulse 84   Temp 99.3 F (37.4 C) (Oral)   Ht 6' (1.829 m)   Wt 182 lb 12.8 oz (82.9 kg)   SpO2 99%   BMI 24.79 kg/m   BP Readings from Last 3 Encounters:  04/08/18 130/74  01/15/18 (!) 142/78  12/10/17 140/88    Wt Readings from Last 3 Encounters:  04/08/18 182 lb 12.8 oz (82.9 kg)  01/15/18 186 lb 2 oz (84.4 kg)  12/10/17 189 lb 6.4 oz (85.9 kg)    Physical  Exam Vitals signs reviewed.  HENT:     Mouth/Throat:     Pharynx: Posterior oropharyngeal erythema present. No oropharyngeal exudate.     Tonsils: Swelling: 2+ on the right. 2+ on the left.  Neck:     Musculoskeletal: Full passive range of motion without pain.  Pulmonary:     Effort: Pulmonary effort is normal.     Breath sounds: Normal breath sounds.  Lymphadenopathy:     Cervical: No cervical adenopathy.  Neurological:     Mental Status: He is alert.     Lab Results  Component Value Date   WBC 5.0 01/15/2018   HGB 16.0 01/15/2018   HCT 45.3 01/15/2018   PLT 105.0 (L) 01/15/2018   GLUCOSE 209 (H) 01/15/2018   CHOL 130 01/15/2018   TRIG 96.0 01/15/2018   HDL 39.70 01/15/2018   LDLCALC 71 01/15/2018   ALT 28 10/15/2017   AST 20 10/15/2017   NA 136 01/15/2018   K 4.5 01/15/2018   CL 99 01/15/2018   CREATININE 0.93 01/15/2018   BUN 18 01/15/2018   CO2 30 01/15/2018   PSA 1.49 10/15/2017   HGBA1C 8.1 (H) 01/15/2018   MICROALBUR <0.7 10/15/2017    Assessment & Plan:   Robert Bowen was seen today for cough.  Diagnoses and all orders for this visit:  Acute nasopharyngitis -     POC Influenza A&B(BINAX/QUICKVUE) -  Chlorphen-PE-Acetaminophen 4-10-325 MG TABS; Take 1 tablet by mouth every 12 (twelve) hours for 2 days. -     benzonatate (TESSALON) 100 MG capsule; Take 1 capsule (100 mg total) by mouth 3 (three) times daily as needed for cough. -     guaiFENesin (MUCINEX) 600 MG 12 hr tablet; Take 1 tablet (600 mg total) by mouth 2 (two) times daily as needed for cough or to loosen phlegm.   I am having Robert Bowen start on Chlorphen-PE-Acetaminophen, benzonatate, and guaiFENesin. I am also having him maintain his metFORMIN, atorvastatin, citalopram, and losartan.  Meds ordered this encounter  Medications  . Chlorphen-PE-Acetaminophen 4-10-325 MG TABS    Sig: Take 1 tablet by mouth every 12 (twelve) hours for 2 days.    Dispense:  4 tablet    Refill:  0    Order  Specific Question:   Supervising Provider    Answer:   Lucille Passy [[id number]]  . benzonatate (TESSALON) 100 MG capsule    Sig: Take 1 capsule (100 mg total) by mouth 3 (three) times daily as needed for cough.    Dispense:  20 capsule    Refill:  0    Order Specific Question:   Supervising Provider    Answer:   Lucille Passy [[id number]]  . guaiFENesin (MUCINEX) 600 MG 12 hr tablet    Sig: Take 1 tablet (600 mg total) by mouth 2 (two) times daily as needed for cough or to loosen phlegm.    Dispense:  14 tablet    Refill:  0    Order Specific Question:   Supervising Provider    Answer:   Lucille Passy [[id number]]    Problem List Items Addressed This Visit    None    Visit Diagnoses    Acute nasopharyngitis    -  Primary   Relevant Medications   Chlorphen-PE-Acetaminophen 4-10-325 MG TABS   benzonatate (TESSALON) 100 MG capsule   guaiFENesin (MUCINEX) 600 MG 12 hr tablet   Other Relevant Orders   POC Influenza A&B(BINAX/QUICKVUE) (Completed)       Follow-up: No follow-ups on file.  Wilfred Lacy, NP

## 2018-04-08 NOTE — Addendum Note (Signed)
Addended by: Abelino Derrick A on: 04/08/2018 12:04 PM   Modules accepted: Orders

## 2018-04-09 ENCOUNTER — Encounter: Payer: Self-pay | Admitting: Nurse Practitioner

## 2018-04-09 DIAGNOSIS — J00 Acute nasopharyngitis [common cold]: Secondary | ICD-10-CM

## 2018-04-09 MED ORDER — AZITHROMYCIN 250 MG PO TABS: 250.0000 mg | ORAL_TABLET | Freq: Every day | ORAL | 0 refills | Status: DC

## 2018-04-09 MED FILL — AZITHROMYCIN 250 MG TABLET: 250 | 5 days supply | Qty: 6 | Fill #0

## 2018-04-10 ENCOUNTER — Encounter: Payer: Self-pay | Admitting: Nurse Practitioner

## 2018-04-11 ENCOUNTER — Telehealth: Payer: Self-pay | Admitting: Nurse Practitioner

## 2018-04-11 NOTE — Telephone Encounter (Signed)
error 

## 2018-04-15 ENCOUNTER — Encounter: Payer: Self-pay | Admitting: Family Medicine

## 2018-04-15 ENCOUNTER — Ambulatory Visit: Payer: Self-pay | Admitting: Family Medicine

## 2018-04-15 DIAGNOSIS — J329 Chronic sinusitis, unspecified: Secondary | ICD-10-CM

## 2018-04-15 DIAGNOSIS — J45909 Unspecified asthma, uncomplicated: Secondary | ICD-10-CM

## 2018-04-15 DIAGNOSIS — J4521 Mild intermittent asthma with (acute) exacerbation: Secondary | ICD-10-CM

## 2018-04-15 DIAGNOSIS — J0100 Acute maxillary sinusitis, unspecified: Secondary | ICD-10-CM

## 2018-04-15 HISTORY — DX: Chronic sinusitis, unspecified: J32.9

## 2018-04-15 HISTORY — DX: Unspecified asthma, uncomplicated: J45.909

## 2018-04-15 MED ORDER — PREDNISONE 10 MG PO TABS: 10.0000 mg | ORAL_TABLET | Freq: Two times a day (BID) | ORAL | 0 refills | Status: AC

## 2018-04-15 MED ORDER — BENZONATATE 200 MG PO CAPS: 200.0000 mg | ORAL_CAPSULE | Freq: Two times a day (BID) | ORAL | 0 refills | Status: DC | PRN

## 2018-04-15 MED ORDER — AMOXICILLIN-POT CLAVULANATE 875-125 MG PO TABS: 1.0000 | ORAL_TABLET | Freq: Two times a day (BID) | ORAL | 0 refills | Status: AC

## 2018-04-15 NOTE — Progress Notes (Signed)
Established Patient Office Visit  Subjective:  Patient ID: Robert Bowen, male    DOB: Sep 15, 1955  Age: 63 y.o. MRN: [id number]  CC:  Chief Complaint  Patient presents with  . Cough  . Headache    HPI Robert Bowen presents for evaluation of an ongoing cough with thick phlegm. He has completed antibiotic therapy with a Z Pak. He did have a fever of 99.8 last night. Today he is afebrile. He is still having tenderness on the right side of his face near his sinuses.  Patient is experiencing a cough with some tightness and wheezing.  He has no history of asthma or smoking.  He has experienced intermittent elevations of temperature.  Cough continues to be productive of thick phlegm.  Past Medical History:  Diagnosis Date  . Diabetes mellitus, type 2 ([place]) 1988  . Glaucoma   . Juvenile cataract of both eyes     Past Surgical History:  Procedure Laterality Date  . EYE SURGERY      Family History  Problem Relation Age of Onset  . Diabetes type II Father   . Heart disease Father   . Liver cancer Mother     Social History   Socioeconomic History  . Marital status: Married    Spouse name: Not on file  . Number of children: Not on file  . Years of education: Not on file  . Highest education level: Not on file  Occupational History  . Not on file  Social Needs  . Financial resource strain: Not on file  . Food insecurity:    Worry: Not on file    Inability: Not on file  . Transportation needs:    Medical: Not on file    Non-medical: Not on file  Tobacco Use  . Smoking status: Never Smoker  . Smokeless tobacco: Never Used  Substance and Sexual Activity  . Alcohol use: Yes    Comment: just on occassion, rarely  . Drug use: No  . Sexual activity: Yes  Lifestyle  . Physical activity:    Days per week: Not on file    Minutes per session: Not on file  . Stress: Not on file  Relationships  . Social connections:    Talks on phone: Not on file    Gets together:  Not on file    Attends religious service: Not on file    Active member of club or organization: Not on file    Attends meetings of clubs or organizations: Not on file    Relationship status: Not on file  . Intimate partner violence:    Fear of current or ex partner: Not on file    Emotionally abused: Not on file    Physically abused: Not on file    Forced sexual activity: Not on file  Other Topics Concern  . Not on file  Social History Narrative   Part time employment.   Non-denominational pastor.    Outpatient Medications Prior to Visit  Medication Sig Dispense Refill  . atorvastatin (LIPITOR) 10 MG tablet Take 1 tablet (10 mg total) by mouth daily. 90 tablet 3  . citalopram (CELEXA) 40 MG tablet Take 1 tablet (40 mg total) by mouth daily. 90 tablet 2  . dapagliflozin propanediol (FARXIGA) 10 MG TABS tablet Take 10 mg by mouth daily. 30 tablet 0  . losartan (COZAAR) 50 MG tablet Take 1 tablet (50 mg total) by mouth daily. 90 tablet 3  . metFORMIN (GLUCOPHAGE-XR) 500  MG 24 hr tablet TAKE 2 TABLETS BY MOUTH 2 TIMES DAILY 360 tablet 2  . azithromycin (ZITHROMAX Z-PAK) 250 MG tablet Take 1 tablet (250 mg total) by mouth daily. Take 2tabs on first day, then 1tab once a day till complete 6 tablet 0  . benzonatate (TESSALON) 100 MG capsule Take 1 capsule (100 mg total) by mouth 3 (three) times daily as needed for cough. 20 capsule 0  . guaiFENesin (MUCINEX) 600 MG 12 hr tablet Take 1 tablet (600 mg total) by mouth 2 (two) times daily as needed for cough or to loosen phlegm. 14 tablet 0   No facility-administered medications prior to visit.     No Known Allergies  ROS Review of Systems  Constitutional: Negative for diaphoresis, fever and unexpected weight change.  HENT: Positive for postnasal drip, sinus pressure and sinus pain. Negative for rhinorrhea.   Eyes: Positive for visual disturbance.  Respiratory: Positive for cough and wheezing. Negative for shortness of breath.     Cardiovascular: Negative.   Gastrointestinal: Negative.   Neurological: Negative.   Psychiatric/Behavioral: Negative.       Objective:    Physical Exam  Constitutional: He is oriented to person, place, and time. He appears well-developed and well-nourished. No distress.  HENT:  Head: Normocephalic and atraumatic.  Right Ear: External ear normal.  Left Ear: External ear normal.  Mouth/Throat: Oropharynx is clear and moist. No oropharyngeal exudate.  Eyes: Conjunctivae are normal. Right eye exhibits no discharge. Left eye exhibits no discharge. No scleral icterus.  Neck: No JVD present. No tracheal deviation present. No thyromegaly present.  Cardiovascular: Normal rate, regular rhythm and normal heart sounds.  Pulmonary/Chest: Effort normal and breath sounds normal. No stridor. No respiratory distress. He has no wheezes. He has no rales.  Lymphadenopathy:    He has no cervical adenopathy.  Neurological: He is alert and oriented to person, place, and time.  Skin: Skin is warm and dry. He is not diaphoretic.  Psychiatric: He has a normal mood and affect. His behavior is normal.    BP 120/70   Pulse 69   Temp 98.9 F (37.2 C) (Oral)   Ht 6' (1.829 m)   SpO2 98%   BMI 24.79 kg/m  Wt Readings from Last 3 Encounters:  04/08/18 182 lb 12.8 oz (82.9 kg)  01/15/18 186 lb 2 oz (84.4 kg)  12/10/17 189 lb 6.4 oz (85.9 kg)   BP Readings from Last 3 Encounters:  04/15/18 120/70  04/08/18 130/74  01/15/18 (!) 142/78   Guideline developer:  UpToDate (see UpToDate for funding source) Date Released: June 2014  Health Maintenance Due  Topic Date Due  . Hepatitis C Screening  1955/10/15  . PNEUMOCOCCAL POLYSACCHARIDE VACCINE AGE 71-64 HIGH RISK  11/20/1956  . OPHTHALMOLOGY EXAM  11/20/1964  . HIV Screening  11/20/1969  . COLONOSCOPY  11/20/2004  . FOOT EXAM  06/20/2017    There are no preventive care reminders to display for this patient.  No results found for: TSH Lab Results   Component Value Date   WBC 5.0 01/15/2018   HGB 16.0 01/15/2018   HCT 45.3 01/15/2018   MCV 84.2 01/15/2018   PLT 105.0 (L) 01/15/2018   Lab Results  Component Value Date   NA 136 01/15/2018   K 4.5 01/15/2018   CO2 30 01/15/2018   GLUCOSE 209 (H) 01/15/2018   BUN 18 01/15/2018   CREATININE 0.93 01/15/2018   BILITOT 1.1 10/15/2017   ALKPHOS 72 10/15/2017  AST 20 10/15/2017   ALT 28 10/15/2017   PROT 7.3 10/15/2017   ALBUMIN 4.6 10/15/2017   CALCIUM 10.0 01/15/2018   GFR 87.18 01/15/2018   Lab Results  Component Value Date   CHOL 130 01/15/2018   Lab Results  Component Value Date   HDL 39.70 01/15/2018   Lab Results  Component Value Date   LDLCALC 71 01/15/2018   Lab Results  Component Value Date   TRIG 96.0 01/15/2018   Lab Results  Component Value Date   CHOLHDL 3 01/15/2018   Lab Results  Component Value Date   HGBA1C 8.1 (H) 01/15/2018      Assessment & Plan:   Problem List Items Addressed This Visit      Respiratory   Sinusitis   Relevant Medications   amoxicillin-clavulanate (AUGMENTIN) 875-125 MG tablet   predniSONE (DELTASONE) 10 MG tablet   benzonatate (TESSALON) 200 MG capsule   RAD (reactive airway disease)   Relevant Medications   predniSONE (DELTASONE) 10 MG tablet   benzonatate (TESSALON) 200 MG capsule      Meds ordered this encounter  Medications  . amoxicillin-clavulanate (AUGMENTIN) 875-125 MG tablet    Sig: Take 1 tablet by mouth 2 (two) times daily for 10 days.    Dispense:  20 tablet    Refill:  0  . predniSONE (DELTASONE) 10 MG tablet    Sig: Take 1 tablet (10 mg total) by mouth 2 (two) times daily with a meal for 5 days.    Dispense:  10 tablet    Refill:  0  . benzonatate (TESSALON) 200 MG capsule    Sig: Take 1 capsule (200 mg total) by mouth 2 (two) times daily as needed for cough.    Dispense:  20 capsule    Refill:  0    Follow-up: Return in about 1 week (around 04/22/2018), or if symptoms worsen or fail  to improve.

## 2018-04-17 ENCOUNTER — Telehealth: Payer: Self-pay | Admitting: Family Medicine

## 2018-04-17 DIAGNOSIS — E119 Type 2 diabetes mellitus without complications: Secondary | ICD-10-CM

## 2018-04-17 DIAGNOSIS — Z794 Long term (current) use of insulin: Principal | ICD-10-CM

## 2018-04-17 DIAGNOSIS — E1139 Type 2 diabetes mellitus with other diabetic ophthalmic complication: Secondary | ICD-10-CM

## 2018-04-17 MED ORDER — DAPAGLIFLOZIN PROPANEDIOL 10 MG PO TABS: 10.0000 mg | ORAL_TABLET | Freq: Every day | ORAL | 1 refills | Status: DC

## 2018-04-17 NOTE — Telephone Encounter (Signed)
Patient came into office and left information about [employer]-medication assistance. Patient is requesting a copy of RX be sent to them. Patient has left all the contact information along with his patient ID for it to be faxed. Please contact patient @ [phone], once it has been complete. Information is in Dr. Bebe Shaggy file in the front office.

## 2018-04-17 NOTE — Telephone Encounter (Signed)
Rx printed to be signed by provider tomorrow.

## 2018-04-18 NOTE — Telephone Encounter (Signed)
Rx signed & faxed along with patient's information. Patient is aware.

## 2018-04-22 ENCOUNTER — Ambulatory Visit: Payer: Self-pay | Admitting: Family Medicine

## 2018-04-22 ENCOUNTER — Encounter: Payer: Self-pay | Admitting: Family Medicine

## 2018-04-22 VITALS — BP 124/70 | HR 72 | Temp 97.8°F | Ht 72.0 in

## 2018-04-22 DIAGNOSIS — J4521 Mild intermittent asthma with (acute) exacerbation: Secondary | ICD-10-CM

## 2018-04-22 DIAGNOSIS — J019 Acute sinusitis, unspecified: Secondary | ICD-10-CM

## 2018-04-22 DIAGNOSIS — R42 Dizziness and giddiness: Secondary | ICD-10-CM

## 2018-04-22 HISTORY — DX: Dizziness and giddiness: R42

## 2018-04-22 MED ORDER — PREDNISONE 10 MG (21) PO TBPK: ORAL_TABLET | ORAL | 0 refills | Status: DC

## 2018-04-22 MED ORDER — ALBUTEROL SULFATE HFA 108 (90 BASE) MCG/ACT IN AERS: 1.0000 | INHALATION_SPRAY | Freq: Four times a day (QID) | RESPIRATORY_TRACT | 2 refills | Status: DC | PRN

## 2018-04-22 MED ORDER — METHYLPREDNISOLONE SODIUM SUCC 125 MG IJ SOLR
125.0000 mg | Freq: Once | INTRAMUSCULAR | Status: AC
  Administered 2018-04-22: 125 mg via INTRAMUSCULAR

## 2018-04-22 NOTE — Progress Notes (Signed)
Established Patient Office Visit  Subjective:  Patient ID: Robert Bowen, male    DOB: 28-Nov-1955  Age: 63 y.o. MRN: [id number]  CC:  Chief Complaint  Patient presents with  . Follow-up    HPI Robert Bowen presents for follow-up of his sinusitis and reactive airway disease.  Patient says that his wheezing continues.  Especially when he takes in a deep breaths or breathes to cold air.  He is also felt a sense of extreme lightheadedness.  He denies a spinning sensation.  Lightheadedness is noted upon standing or standing for long prolonged periods.  He has ongoing pressure especially in his right cheek area.  He is having some headaches in the right frontal area and pain on top of his head on rare occasion.  Patient continues to have some rhinorrhea that is been nonpurulent.  Cough is been nonproductive.  There is been no fever or chills.  Past Medical History:  Diagnosis Date  . Diabetes mellitus, type 2 ([place]) 1988  . Glaucoma   . Juvenile cataract of both eyes     Past Surgical History:  Procedure Laterality Date  . EYE SURGERY      Family History  Problem Relation Age of Onset  . Diabetes type II Father   . Heart disease Father   . Liver cancer Mother     Social History   Socioeconomic History  . Marital status: Married    Spouse name: Not on file  . Number of children: Not on file  . Years of education: Not on file  . Highest education level: Not on file  Occupational History  . Not on file  Social Needs  . Financial resource strain: Not on file  . Food insecurity:    Worry: Not on file    Inability: Not on file  . Transportation needs:    Medical: Not on file    Non-medical: Not on file  Tobacco Use  . Smoking status: Never Smoker  . Smokeless tobacco: Never Used  Substance and Sexual Activity  . Alcohol use: Yes    Comment: just on occassion, rarely  . Drug use: No  . Sexual activity: Yes  Lifestyle  . Physical activity:    Days per week: Not  on file    Minutes per session: Not on file  . Stress: Not on file  Relationships  . Social connections:    Talks on phone: Not on file    Gets together: Not on file    Attends religious service: Not on file    Active member of club or organization: Not on file    Attends meetings of clubs or organizations: Not on file    Relationship status: Not on file  . Intimate partner violence:    Fear of current or ex partner: Not on file    Emotionally abused: Not on file    Physically abused: Not on file    Forced sexual activity: Not on file  Other Topics Concern  . Not on file  Social History Narrative   Part time employment.   Non-denominational pastor.    Outpatient Medications Prior to Visit  Medication Sig Dispense Refill  . amoxicillin-clavulanate (AUGMENTIN) 875-125 MG tablet Take 1 tablet by mouth 2 (two) times daily for 10 days. 20 tablet 0  . atorvastatin (LIPITOR) 10 MG tablet Take 1 tablet (10 mg total) by mouth daily. 90 tablet 3  . benzonatate (TESSALON) 200 MG capsule Take 1 capsule (200  mg total) by mouth 2 (two) times daily as needed for cough. 20 capsule 0  . citalopram (CELEXA) 40 MG tablet Take 1 tablet (40 mg total) by mouth daily. 90 tablet 2  . dapagliflozin propanediol (FARXIGA) 10 MG TABS tablet Take 10 mg by mouth daily. 90 tablet 1  . losartan (COZAAR) 50 MG tablet Take 1 tablet (50 mg total) by mouth daily. 90 tablet 3  . metFORMIN (GLUCOPHAGE-XR) 500 MG 24 hr tablet TAKE 2 TABLETS BY MOUTH 2 TIMES DAILY 360 tablet 2   No facility-administered medications prior to visit.     No Known Allergies  ROS Review of Systems    Objective:    Physical Exam  BP 124/70   Pulse 72   Temp 97.8 F (36.6 C) (Oral)   Ht 6' (1.829 m)   SpO2 98%   BMI 24.79 kg/m  Wt Readings from Last 3 Encounters:  04/08/18 182 lb 12.8 oz (82.9 kg)  01/15/18 186 lb 2 oz (84.4 kg)  12/10/17 189 lb 6.4 oz (85.9 kg)   BP Readings from Last 3 Encounters:  04/22/18 124/70    04/15/18 120/70  04/08/18 130/74   Guideline developer:  UpToDate (see UpToDate for funding source) Date Released: June 2014  Health Maintenance Due  Topic Date Due  . Hepatitis C Screening  April 21, 1955  . PNEUMOCOCCAL POLYSACCHARIDE VACCINE AGE 66-64 HIGH RISK  11/20/1956  . OPHTHALMOLOGY EXAM  11/20/1964  . HIV Screening  11/20/1969  . COLONOSCOPY  11/20/2004  . FOOT EXAM  06/20/2017    There are no preventive care reminders to display for this patient.  No results found for: TSH Lab Results  Component Value Date   WBC 5.0 01/15/2018   HGB 16.0 01/15/2018   HCT 45.3 01/15/2018   MCV 84.2 01/15/2018   PLT 105.0 (L) 01/15/2018   Lab Results  Component Value Date   NA 136 01/15/2018   K 4.5 01/15/2018   CO2 30 01/15/2018   GLUCOSE 209 (H) 01/15/2018   BUN 18 01/15/2018   CREATININE 0.93 01/15/2018   BILITOT 1.1 10/15/2017   ALKPHOS 72 10/15/2017   AST 20 10/15/2017   ALT 28 10/15/2017   PROT 7.3 10/15/2017   ALBUMIN 4.6 10/15/2017   CALCIUM 10.0 01/15/2018   GFR 87.18 01/15/2018   Lab Results  Component Value Date   CHOL 130 01/15/2018   Lab Results  Component Value Date   HDL 39.70 01/15/2018   Lab Results  Component Value Date   LDLCALC 71 01/15/2018   Lab Results  Component Value Date   TRIG 96.0 01/15/2018   Lab Results  Component Value Date   CHOLHDL 3 01/15/2018   Lab Results  Component Value Date   HGBA1C 8.1 (H) 01/15/2018      Assessment & Plan:   Problem List Items Addressed This Visit      Respiratory   Sinusitis - Primary   Relevant Medications   methylPREDNISolone sodium succinate (SOLU-MEDROL) 125 mg/2 mL injection 125 mg (Completed) (Start on 04/22/2018 11:15 AM)   predniSONE (STERAPRED UNI-PAK 21 TAB) 10 MG (21) TBPK tablet   RAD (reactive airway disease)   Relevant Medications   methylPREDNISolone sodium succinate (SOLU-MEDROL) 125 mg/2 mL injection 125 mg (Completed) (Start on 04/22/2018 11:15 AM)   predniSONE (STERAPRED  UNI-PAK 21 TAB) 10 MG (21) TBPK tablet   albuterol (PROVENTIL HFA;VENTOLIN HFA) 108 (90 Base) MCG/ACT inhaler     Other   Feeling light headed  Meds ordered this encounter  Medications  . methylPREDNISolone sodium succinate (SOLU-MEDROL) 125 mg/2 mL injection 125 mg  . predniSONE (STERAPRED UNI-PAK 21 TAB) 10 MG (21) TBPK tablet    Sig: Take 6 today, 5 tomorrow, 4 the next day and then 3, 2, 1 and stop    Dispense:  21 tablet    Refill:  0    Start tomorrow.  Marland Kitchen albuterol (PROVENTIL HFA;VENTOLIN HFA) 108 (90 Base) MCG/ACT inhaler    Sig: Inhale 1-2 puffs into the lungs every 6 (six) hours as needed for wheezing or shortness of breath.    Dispense:  1 Inhaler    Refill:  2    Pharm instruct please.    Follow-up: Return if symptoms worsen or fail to improve.   Orthostatics were good.  Solu-Medrol injection today with 6-day prednisone taper.  Gave the patient an inhaler to try for the cough and tightness and wheezing as well.  Asked pharmacy to instruct.  He will start the prednisone taper tomorrow.  Warned that there may be an elevation in his blood glucose with the additional steroid therapy.

## 2018-04-23 ENCOUNTER — Ambulatory Visit: Payer: Self-pay | Admitting: Family Medicine

## 2018-04-28 ENCOUNTER — Encounter: Payer: Self-pay | Admitting: Family Medicine

## 2018-04-30 ENCOUNTER — Ambulatory Visit: Payer: Self-pay | Admitting: Family Medicine

## 2018-05-10 ENCOUNTER — Ambulatory Visit: Payer: Self-pay | Admitting: Family Medicine

## 2018-05-10 ENCOUNTER — Encounter: Payer: Self-pay | Admitting: Family Medicine

## 2018-05-10 ENCOUNTER — Ambulatory Visit (INDEPENDENT_AMBULATORY_CARE_PROVIDER_SITE_OTHER): Payer: Self-pay

## 2018-05-10 VITALS — BP 128/70 | HR 98 | Ht 72.0 in | Wt 182.0 lb

## 2018-05-10 DIAGNOSIS — F419 Anxiety disorder, unspecified: Secondary | ICD-10-CM

## 2018-05-10 DIAGNOSIS — R0600 Dyspnea, unspecified: Secondary | ICD-10-CM

## 2018-05-10 DIAGNOSIS — R0609 Other forms of dyspnea: Secondary | ICD-10-CM

## 2018-05-10 DIAGNOSIS — F329 Major depressive disorder, single episode, unspecified: Secondary | ICD-10-CM

## 2018-05-10 DIAGNOSIS — G4459 Other complicated headache syndrome: Secondary | ICD-10-CM

## 2018-05-10 DIAGNOSIS — R51 Headache: Secondary | ICD-10-CM

## 2018-05-10 DIAGNOSIS — R519 Headache, unspecified: Secondary | ICD-10-CM

## 2018-05-10 DIAGNOSIS — E7800 Pure hypercholesterolemia, unspecified: Secondary | ICD-10-CM

## 2018-05-10 DIAGNOSIS — Z794 Long term (current) use of insulin: Secondary | ICD-10-CM

## 2018-05-10 DIAGNOSIS — F32A Depression, unspecified: Secondary | ICD-10-CM

## 2018-05-10 DIAGNOSIS — E1139 Type 2 diabetes mellitus with other diabetic ophthalmic complication: Secondary | ICD-10-CM

## 2018-05-10 DIAGNOSIS — R42 Dizziness and giddiness: Secondary | ICD-10-CM

## 2018-05-10 DIAGNOSIS — E119 Type 2 diabetes mellitus without complications: Secondary | ICD-10-CM

## 2018-05-10 HISTORY — DX: Headache, unspecified: R51.9

## 2018-05-10 HISTORY — DX: Pure hypercholesterolemia, unspecified: E78.00

## 2018-05-10 HISTORY — DX: Dyspnea, unspecified: R06.00

## 2018-05-10 HISTORY — DX: Other forms of dyspnea: R06.09

## 2018-05-10 HISTORY — DX: Other complicated headache syndrome: G44.59

## 2018-05-10 LAB — LIPID PANEL
CHOLESTEROL: 161 mg/dL (ref 0–200)
HDL: 36.6 mg/dL — ABNORMAL LOW (ref >39.00)
LDL Cholesterol: 85 mg/dL (ref 0–99)
NonHDL: 124.19
Total CHOL/HDL Ratio: 4
Triglycerides: 196 mg/dL — ABNORMAL HIGH (ref 0.0–149.0)
VLDL: 39.2 mg/dL (ref 0.0–40.0)

## 2018-05-10 LAB — LDL CHOLESTEROL, DIRECT: Direct LDL: 97 mg/dL

## 2018-05-10 LAB — COMPREHENSIVE METABOLIC PANEL
ALT: 26 U/L (ref 0–53)
AST: 17 U/L (ref 0–37)
Albumin: 4.6 g/dL (ref 3.5–5.2)
Alkaline Phosphatase: 96 U/L (ref 39–117)
BILIRUBIN TOTAL: 1.2 mg/dL (ref 0.2–1.2)
BUN: 13 mg/dL (ref 6–23)
CO2: 28 mEq/L (ref 19–32)
Calcium: 9.5 mg/dL (ref 8.4–10.5)
Chloride: 97 mEq/L (ref 96–112)
Creatinine, Ser: 0.9 mg/dL (ref 0.40–1.50)
GFR: 85.1 mL/min (ref >60.00)
Glucose, Bld: 353 mg/dL — ABNORMAL HIGH (ref 70–99)
Potassium: 4.4 mEq/L (ref 3.5–5.1)
Sodium: 134 mEq/L — ABNORMAL LOW (ref 135–145)
Total Protein: 7.3 g/dL (ref 6.0–8.3)

## 2018-05-10 LAB — CBC
HCT: 43 % (ref 39.0–52.0)
Hemoglobin: 15.4 g/dL (ref 13.0–17.0)
MCHC: 35.8 g/dL (ref 30.0–36.0)
MCV: 83.7 fl (ref 78.0–100.0)
Platelets: 82 10*3/uL — ABNORMAL LOW (ref 150.0–400.0)
RBC: 5.14 Mil/uL (ref 4.22–5.81)
RDW: 14.6 % (ref 11.5–15.5)
WBC: 4.9 10*3/uL (ref 4.0–10.5)

## 2018-05-10 LAB — HEMOGLOBIN A1C: Hgb A1c MFr Bld: 11 % — ABNORMAL HIGH (ref 4.6–6.5)

## 2018-05-10 MED ORDER — LOSARTAN POTASSIUM 25 MG PO TABS: 25.0000 mg | ORAL_TABLET | Freq: Every day | ORAL | 1 refills | Status: DC

## 2018-05-10 MED ORDER — METFORMIN HCL ER 500 MG PO TB24: ORAL_TABLET | ORAL | 2 refills | Status: DC

## 2018-05-10 MED ORDER — DAPAGLIFLOZIN PROPANEDIOL 10 MG PO TABS: 10.0000 mg | ORAL_TABLET | Freq: Every day | ORAL | 1 refills | Status: DC

## 2018-05-10 MED ORDER — CITALOPRAM HYDROBROMIDE 40 MG PO TABS: 40.0000 mg | ORAL_TABLET | Freq: Every day | ORAL | 2 refills | Status: DC

## 2018-05-10 MED ORDER — ATORVASTATIN CALCIUM 20 MG PO TABS: 20.0000 mg | ORAL_TABLET | Freq: Every day | ORAL | 3 refills | Status: DC

## 2018-05-10 NOTE — Progress Notes (Signed)
Established Patient Office Visit  Subjective:  Patient ID: Robert Bowen, male    DOB: 1955/08/11  Age: 63 y.o. MRN: [id number]  CC:  Chief Complaint  Patient presents with  . Follow-up    HPI Robert Bowen presents for further evaluation of headaches that have been going on over the last few months.  They are primarily on the right side of his head.  Unfortunately they seem to be getting worse and more frequent.  Patient is having no sinus symptoms at this time.  Denies stuffy nose postnasal drip facial pressure or teeth pain.  There is been no fever or chills or neck stiffness.  Denies dysphasia or unilateral weakness or paresthesia.  He has no headache history.  He also is concerned about shortness of breath that he describes as dyspnea with exertion.  There is also associated flutter in his chest in the midsternal area.  Denies frank chest pain nausea and vomiting or diaphoresis.  There is a cough if he takes a deep breath.  He is no longer wheezing and there is been no phlegm or fever.  He has been out of his Robert Bowen for 4 weeks due to an error by the manufacturer of the drug who is been supplying him with this medication.  He has been lightheaded upon standing.  Pressure has been normal at the last several visits.  Past Medical History:  Diagnosis Date  . Diabetes mellitus, type 2 (DISH) 1988  . Glaucoma   . Juvenile cataract of both eyes     Past Surgical History:  Procedure Laterality Date  . EYE SURGERY      Family History  Problem Relation Age of Onset  . Diabetes type II Father   . Heart disease Father   . Liver cancer Mother     Social History   Socioeconomic History  . Marital status: Married    Spouse name: Not on file  . Number of children: Not on file  . Years of education: Not on file  . Highest education level: Not on file  Occupational History  . Not on file  Social Needs  . Financial resource strain: Not on file  . Food insecurity:    Worry:  Not on file    Inability: Not on file  . Transportation needs:    Medical: Not on file    Non-medical: Not on file  Tobacco Use  . Smoking status: Never Smoker  . Smokeless tobacco: Never Used  Substance and Sexual Activity  . Alcohol use: Yes    Comment: just on occassion, rarely  . Drug use: No  . Sexual activity: Yes  Lifestyle  . Physical activity:    Days per week: Not on file    Minutes per session: Not on file  . Stress: Not on file  Relationships  . Social connections:    Talks on phone: Not on file    Gets together: Not on file    Attends religious service: Not on file    Active member of club or organization: Not on file    Attends meetings of clubs or organizations: Not on file    Relationship status: Not on file  . Intimate partner violence:    Fear of current or ex partner: Not on file    Emotionally abused: Not on file    Physically abused: Not on file    Forced sexual activity: Not on file  Other Topics Concern  . Not on  file  Social History Narrative   Part time employment.   Non-denominational pastor.    Outpatient Medications Prior to Visit  Medication Sig Dispense Refill  . albuterol (PROVENTIL HFA;VENTOLIN HFA) 108 (90 Base) MCG/ACT inhaler Inhale 1-2 puffs into the lungs every 6 (six) hours as needed for wheezing or shortness of breath. 1 Inhaler 2  . atorvastatin (LIPITOR) 10 MG tablet Take 1 tablet (10 mg total) by mouth daily. 90 tablet 3  . citalopram (CELEXA) 40 MG tablet Take 1 tablet (40 mg total) by mouth daily. 90 tablet 2  . dapagliflozin propanediol (FARXIGA) 10 MG TABS tablet Take 10 mg by mouth daily. 90 tablet 1  . losartan (COZAAR) 50 MG tablet Take 1 tablet (50 mg total) by mouth daily. 90 tablet 3  . metFORMIN (GLUCOPHAGE-XR) 500 MG 24 hr tablet TAKE 2 TABLETS BY MOUTH 2 TIMES DAILY 360 tablet 2  . benzonatate (TESSALON) 200 MG capsule Take 1 capsule (200 mg total) by mouth 2 (two) times daily as needed for cough. 20 capsule 0  .  predniSONE (STERAPRED UNI-PAK 21 TAB) 10 MG (21) TBPK tablet Take 6 today, 5 tomorrow, 4 the next day and then 3, 2, 1 and stop 21 tablet 0   No facility-administered medications prior to visit.     No Known Allergies  ROS Review of Systems  Constitutional: Negative for chills, diaphoresis, fatigue, fever and unexpected weight change.  HENT: Negative for congestion, postnasal drip, rhinorrhea, sinus pressure, sinus pain, sneezing and sore throat.   Eyes: Positive for visual disturbance. Negative for pain.  Respiratory: Positive for cough and shortness of breath. Negative for wheezing.   Cardiovascular: Positive for palpitations. Negative for chest pain.  Gastrointestinal: Negative for abdominal pain, nausea and vomiting.  Endocrine: Negative for polyphagia and polyuria.  Genitourinary: Negative for difficulty urinating, frequency and urgency.  Musculoskeletal: Negative for arthralgias and myalgias.  Skin: Negative for pallor and rash.  Allergic/Immunologic: Negative for immunocompromised state.  Neurological: Positive for light-headedness and headaches. Negative for dizziness, seizures, facial asymmetry, weakness and numbness.  Hematological: Does not bruise/bleed easily.  Psychiatric/Behavioral: Positive for dysphoric mood. The patient is nervous/anxious.    Depression screen [facility] 2/9 05/10/2018 01/15/2018 10/15/2017  Decreased Interest 1 1 0  Down, Depressed, Hopeless 2 1 0  PHQ - 2 Score 3 2 0  Altered sleeping 1 0 0  Tired, decreased energy 2 0 1  Change in appetite 0 0 0  Feeling bad or failure about yourself  1 0 0  Trouble concentrating 0 1 1  Moving slowly or fidgety/restless 1 0 0  Suicidal thoughts 0 0 0  PHQ-9 Score 8 3 2   Difficult doing work/chores Somewhat difficult - -      Objective:    Physical Exam  Constitutional: He is oriented to person, place, and time. He appears well-developed and well-nourished. No distress.  HENT:  Head: Normocephalic and atraumatic.   Right Ear: External ear normal.  Left Ear: External ear normal.  Mouth/Throat: Oropharynx is clear and moist. No oropharyngeal exudate.  Eyes: Right eye exhibits no discharge. Left eye exhibits no discharge. No scleral icterus.  Neck: Neck supple. No JVD present. No tracheal deviation present. No thyromegaly present.  Cardiovascular: Normal rate, regular rhythm and normal heart sounds.  Pulmonary/Chest: Effort normal and breath sounds normal. No stridor.  Abdominal: Bowel sounds are normal.  Lymphadenopathy:    He has no cervical adenopathy.  Neurological: He is alert and oriented to person, place, and time.  Skin: Skin is warm and dry. No rash noted. He is not diaphoretic. No erythema.  Psychiatric: He has a normal mood and affect. His behavior is normal.  ED ECG REPORT   Date: 05/10/2018   Rate: 76  Rhythm: normal sinus rhythm  QRS Axis: indeterminate  Intervals: normal  ST/T Wave abnormalities: normal  Conduction Disutrbances:nonspecific intraventricular conduction delay  Narrative Interpretation:   Old EKG Reviewed: none available  I have personally reviewed the EKG tracing and agree with the computerized printout as noted.   BP 128/70   Pulse 98   Ht 6' (1.829 m)   Wt 182 lb (82.6 kg)   SpO2 98%   BMI 24.68 kg/m  Wt Readings from Last 3 Encounters:  05/10/18 182 lb (82.6 kg)  04/08/18 182 lb 12.8 oz (82.9 kg)  01/15/18 186 lb 2 oz (84.4 kg)   BP Readings from Last 3 Encounters:  05/10/18 128/70  04/22/18 124/70  04/15/18 120/70   Guideline developer:  UpToDate (see UpToDate for funding source) Date Released: June 2014  Health Maintenance Due  Topic Date Due  . Hepatitis C Screening  [birth year]  . PNEUMOCOCCAL POLYSACCHARIDE VACCINE AGE 56-64 HIGH RISK  11/20/1956  . OPHTHALMOLOGY EXAM  11/20/1964  . HIV Screening  11/20/1969  . COLONOSCOPY  11/20/2004  . FOOT EXAM  06/20/2017    There are no preventive care reminders to display for this patient.  No  results found for: TSH Lab Results  Component Value Date   WBC 5.0 01/15/2018   HGB 16.0 01/15/2018   HCT 45.3 01/15/2018   MCV 84.2 01/15/2018   PLT 105.0 (L) 01/15/2018   Lab Results  Component Value Date   NA 136 01/15/2018   K 4.5 01/15/2018   CO2 30 01/15/2018   GLUCOSE 209 (H) 01/15/2018   BUN 18 01/15/2018   CREATININE 0.93 01/15/2018   BILITOT 1.1 10/15/2017   ALKPHOS 72 10/15/2017   AST 20 10/15/2017   ALT 28 10/15/2017   PROT 7.3 10/15/2017   ALBUMIN 4.6 10/15/2017   CALCIUM 10.0 01/15/2018   GFR 87.18 01/15/2018   Lab Results  Component Value Date   CHOL 130 01/15/2018   Lab Results  Component Value Date   HDL 39.70 01/15/2018   Lab Results  Component Value Date   LDLCALC 71 01/15/2018   Lab Results  Component Value Date   TRIG 96.0 01/15/2018   Lab Results  Component Value Date   CHOLHDL 3 01/15/2018   Lab Results  Component Value Date   HGBA1C 8.1 (H) 01/15/2018      Assessment & Plan:   Problem List Items Addressed This Visit      Endocrine   Controlled type 2 diabetes mellitus without complication, without long-term current use of insulin (HCC) - Primary   Relevant Medications   dapagliflozin propanediol (FARXIGA) 10 MG TABS tablet   metFORMIN (GLUCOPHAGE-XR) 500 MG 24 hr tablet   losartan (COZAAR) 25 MG tablet   atorvastatin (LIPITOR) 20 MG tablet   Other Relevant Orders   CBC   Comprehensive metabolic panel   Hemoglobin A1c     Other   Anxiety and depression   Relevant Medications   citalopram (CELEXA) 40 MG tablet   Feeling light headed   Nonintractable headache   Relevant Medications   citalopram (CELEXA) 40 MG tablet   DOE (dyspnea on exertion)   Relevant Orders   EKG 12-Lead (Completed)   DG Chest 2 View   Ambulatory referral to  Cardiology   Other complicated headache syndrome   Relevant Medications   citalopram (CELEXA) 40 MG tablet   Other Relevant Orders   CT HEAD WO CONTRAST   Elevated LDL cholesterol level    Relevant Medications   atorvastatin (LIPITOR) 20 MG tablet   Other Relevant Orders   LDL cholesterol, direct   Lipid panel    Other Visit Diagnoses    Type 2 diabetes mellitus with other ophthalmic complication, with long-term current use of insulin (HCC)       Relevant Medications   dapagliflozin propanediol (FARXIGA) 10 MG TABS tablet   metFORMIN (GLUCOPHAGE-XR) 500 MG 24 hr tablet   losartan (COZAAR) 25 MG tablet   atorvastatin (LIPITOR) 20 MG tablet      Meds ordered this encounter  Medications  . citalopram (CELEXA) 40 MG tablet    Sig: Take 1 tablet (40 mg total) by mouth daily.    Dispense:  90 tablet    Refill:  2  . dapagliflozin propanediol (FARXIGA) 10 MG TABS tablet    Sig: Take 10 mg by mouth daily.    Dispense:  90 tablet    Refill:  1  . metFORMIN (GLUCOPHAGE-XR) 500 MG 24 hr tablet    Sig: TAKE 2 TABLETS BY MOUTH 2 TIMES DAILY    Dispense:  360 tablet    Refill:  2  . losartan (COZAAR) 25 MG tablet    Sig: Take 1 tablet (25 mg total) by mouth daily.    Dispense:  100 tablet    Refill:  1  . atorvastatin (LIPITOR) 20 MG tablet    Sig: Take 1 tablet (20 mg total) by mouth daily.    Dispense:  90 tablet    Refill:  3    Follow-up: No follow-ups on file.   Have decreased losartan to 25 mg daily to hopefully help the lightheadedness.  Increasing her atorvastatin to 20 mg daily to further lower his LDL cholesterol.  Chest x-ray cardiology referral to help assess dyspnea on exertion with palpitations.  Continue all other medicines as above.  Follow-up in 1 month.

## 2018-05-15 ENCOUNTER — Ambulatory Visit (HOSPITAL_BASED_OUTPATIENT_CLINIC_OR_DEPARTMENT_OTHER)
Admission: RE | Admit: 2018-05-15 | Discharge: 2018-05-15 | Disposition: A | Discharge status: Home / Self Care | Payer: Self-pay | Source: Ambulatory Visit | Attending: Family Medicine | Admitting: Family Medicine

## 2018-05-15 DIAGNOSIS — G4459 Other complicated headache syndrome: Secondary | ICD-10-CM

## 2018-06-03 ENCOUNTER — Encounter: Payer: Self-pay | Admitting: Cardiology

## 2018-06-03 ENCOUNTER — Ambulatory Visit (INDEPENDENT_AMBULATORY_CARE_PROVIDER_SITE_OTHER): Payer: Self-pay | Admitting: Cardiology

## 2018-06-03 VITALS — BP 116/60 | HR 87 | Ht 72.0 in | Wt 182.0 lb

## 2018-06-03 DIAGNOSIS — E782 Mixed hyperlipidemia: Secondary | ICD-10-CM

## 2018-06-03 DIAGNOSIS — E119 Type 2 diabetes mellitus without complications: Secondary | ICD-10-CM

## 2018-06-03 DIAGNOSIS — R0609 Other forms of dyspnea: Secondary | ICD-10-CM

## 2018-06-03 HISTORY — DX: Mixed hyperlipidemia: E78.2

## 2018-06-03 NOTE — Progress Notes (Signed)
Cardiology Office Note:    Date:  06/03/2018   ID:  Robert Bowen, DOB 1955/10/10, MRN [id number]  PCP:  Robert Maw, MD  Cardiologist:  Jenean Lindau, MD   Referring MD: Robert Bowen,*    ASSESSMENT:    1. DOE (dyspnea on exertion)   2. Controlled type 2 diabetes mellitus without complication, without long-term current use of insulin ([place])   3. Mixed dyslipidemia    PLAN:    In order of problems listed above:  1. Primary prevention stressed with the patient.  Importance of compliance with diet and medication stressed and he vocalized understanding.  His blood pressure is stable.  Diet was discussed for dyslipidemia and diabetes mellitus. 2. In view of shortness of breath he will undergo exercise stress Cardiolite testing.  Echocardiogram will be done to assess murmur heard on auscultation.  In view of elevated heart rate and weight loss I will do a TSH.  I told him to address these issues with his primary care physician. 3. Patient will be seen in follow-up appointment in 6 months or earlier if the patient has any concerns    Medication Adjustments/Labs and Tests Ordered: Current medicines are reviewed at length with the patient today.  Concerns regarding medicines are outlined above.  Orders Placed This Encounter  Procedures  . TSH  . MYOCARDIAL PERFUSION IMAGING  . ECHOCARDIOGRAM COMPLETE   No orders of the defined types were placed in this encounter.    History of Present Illness:    Robert Bowen is a 63 y.o. male who is being seen today for the evaluation of shortness of breath on exertion at the request of Robert Bowen,*.  Patient is a pleasant 63 year old male.  He is accompanied by his wife for this visit.  He is referred because of shortness of breath on exertion.  No chest pain orthopnea or PND.  He says this is been happening over the past several weeks.  Patient mentions to me that he has lost weight over the past several  weeks and a significant amount of weight.  He also tells me that his heart rate is elevated.  At the time of my evaluation, the patient is alert awake oriented and in no distress.  Past Medical History:  Diagnosis Date  . Diabetes mellitus, type 2 (Robert Bowen) 1988  . Glaucoma   . Juvenile cataract of both eyes     Past Surgical History:  Procedure Laterality Date  . EYE SURGERY      Current Medications: Current Meds  Medication Sig  . albuterol (PROVENTIL HFA;VENTOLIN HFA) 108 (90 Base) MCG/ACT inhaler Inhale 1-2 puffs into the lungs every 6 (six) hours as needed for wheezing or shortness of breath.  Marland Kitchen atorvastatin (LIPITOR) 20 MG tablet Take 1 tablet (20 mg total) by mouth daily.  . citalopram (CELEXA) 40 MG tablet Take 1 tablet (40 mg total) by mouth daily.  . dapagliflozin propanediol (FARXIGA) 10 MG TABS tablet Take 10 mg by mouth daily.  Marland Kitchen losartan (COZAAR) 25 MG tablet Take 1 tablet (25 mg total) by mouth daily.  . metFORMIN (GLUCOPHAGE-XR) 500 MG 24 hr tablet TAKE 2 TABLETS BY MOUTH 2 TIMES DAILY     Allergies:   Patient has no known allergies.   Social History   Socioeconomic History  . Marital status: Married    Spouse name: Not on file  . Number of children: Not on file  . Years of education: Not on file  .  Highest education level: Not on file  Occupational History  . Not on file  Social Needs  . Financial resource strain: Not on file  . Food insecurity:    Worry: Not on file    Inability: Not on file  . Transportation needs:    Medical: Not on file    Non-medical: Not on file  Tobacco Use  . Smoking status: Never Smoker  . Smokeless tobacco: Never Used  Substance and Sexual Activity  . Alcohol use: Yes    Comment: just on occassion, rarely  . Drug use: No  . Sexual activity: Yes  Lifestyle  . Physical activity:    Days per week: Not on file    Minutes per session: Not on file  . Stress: Not on file  Relationships  . Social connections:    Talks on  phone: Not on file    Gets together: Not on file    Attends religious service: Not on file    Active member of club or organization: Not on file    Attends meetings of clubs or organizations: Not on file    Relationship status: Not on file  Other Topics Concern  . Not on file  Social History Narrative   Part time employment.   Non-denominational pastor.     Family History: The patient's family history includes Diabetes type II in his father; Heart disease in his father; Liver cancer in his mother.  ROS:   Please see the history of present illness.    All other systems reviewed and are negative.  EKGs/Labs/Other Studies Reviewed:    The following studies were reviewed today: EKG reveals sinus rhythm and nonspecific ST-T changes   Recent Labs: 05/10/2018: ALT 26; BUN 13; Creatinine, Ser 0.90; Hemoglobin 15.4; Platelets 82.0; Potassium 4.4; Sodium 134  Recent Lipid Panel    Component Value Date/Time   CHOL 161 05/10/2018 1033   TRIG 196.0 (H) 05/10/2018 1033   HDL 36.60 (L) 05/10/2018 1033   CHOLHDL 4 05/10/2018 1033   VLDL 39.2 05/10/2018 1033   LDLCALC 85 05/10/2018 1033   LDLDIRECT 97.0 05/10/2018 1033    Physical Exam:    VS:  BP 116/60 (BP Location: Right Arm, Patient Position: Sitting, Cuff Size: Normal)   Pulse 87   Ht 6' (1.829 m)   Wt 182 lb (82.6 kg)   SpO2 98%   BMI 24.68 kg/m     Wt Readings from Last 3 Encounters:  06/03/18 182 lb (82.6 kg)  05/10/18 182 lb (82.6 kg)  04/08/18 182 lb 12.8 oz (82.9 kg)     GEN: Patient is in no acute distress HEENT: Normal NECK: No JVD; No carotid bruits LYMPHATICS: No lymphadenopathy CARDIAC: S1 S2 regular, 2/6 systolic murmur at the apex. RESPIRATORY:  Clear to auscultation without rales, wheezing or rhonchi  ABDOMEN: Soft, non-tender, non-distended MUSCULOSKELETAL:  No edema; No deformity  SKIN: Warm and dry NEUROLOGIC:  Alert and oriented x 3 PSYCHIATRIC:  Normal affect    Signed, Jenean Lindau, MD    06/03/2018 3:12 PM    [facility]

## 2018-06-03 NOTE — Progress Notes (Signed)
Cardiology Office Note:    Date:  06/03/2018   ID:  Robert Bowen, DOB 12/27/1955, MRN [id number]  PCP:  Libby Maw, MD  Cardiologist:  Jenean Lindau, MD   Referring MD: Libby Maw,*    ASSESSMENT:    1. DOE (dyspnea on exertion)   2. Controlled type 2 diabetes mellitus without complication, without long-term current use of insulin ([place])   3. Mixed dyslipidemia    PLAN:    In order of problems listed above:  1. Primary prevention stressed with the patient.  Importance of compliance with diet and medication stressed and he vocalized understanding.  I reviewed lipids and discussed diet with him. 2. He mentions tachycardia and also significant weight loss in the past several weeks.  I told him to talk to his primary care physician about it.  We will do a TSH today to assess this. 3. In view of his symptoms of shortness of breath he will have a exercise stress Cardiolite.  Echocardiogram will be done to assess murmur heard on auscultation. 4. Patient will be seen in follow-up appointment in 6 months or earlier if the patient has any concerns    Medication Adjustments/Labs and Tests Ordered: Current medicines are reviewed at length with the patient today.  Concerns regarding medicines are outlined above.  No orders of the defined types were placed in this encounter.  No orders of the defined types were placed in this encounter.    History of Present Illness:    Robert Bowen is a 63 y.o. male who is being seen today for the evaluation of shortness of breath on exertion at the request of Libby Maw,*.  Patient is a pleasant 63 year old male.  He is seen accompanied by his wife.  He mentions to me that over the past several weeks he has noted shortness of breath on exertion.  No chest pain orthopnea or PND.  He leads a sedentary lifestyle.  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 At the time of my evaluation, the patient is alert  awake oriented and in no distress.  Past Medical History:  Diagnosis Date  . Diabetes mellitus, type 2 ([place]) 1988  . Glaucoma   . Juvenile cataract of both eyes     Past Surgical History:  Procedure Laterality Date  . EYE SURGERY      Current Medications: Current Meds  Medication Sig  . albuterol (PROVENTIL HFA;VENTOLIN HFA) 108 (90 Base) MCG/ACT inhaler Inhale 1-2 puffs into the lungs every 6 (six) hours as needed for wheezing or shortness of breath.  Marland Kitchen atorvastatin (LIPITOR) 20 MG tablet Take 1 tablet (20 mg total) by mouth daily.  . citalopram (CELEXA) 40 MG tablet Take 1 tablet (40 mg total) by mouth daily.  . dapagliflozin propanediol (FARXIGA) 10 MG TABS tablet Take 10 mg by mouth daily.  Marland Kitchen losartan (COZAAR) 25 MG tablet Take 1 tablet (25 mg total) by mouth daily.  . metFORMIN (GLUCOPHAGE-XR) 500 MG 24 hr tablet TAKE 2 TABLETS BY MOUTH 2 TIMES DAILY     Allergies:   Patient has no known allergies.   Social History   Socioeconomic History  . Marital status: Married    Spouse name: Not on file  . Number of children: Not on file  . Years of education: Not on file  . Highest education level: Not on file  Occupational History  . Not on file  Social Needs  . Financial resource strain: Not  on file  . Food insecurity:    Worry: Not on file    Inability: Not on file  . Transportation needs:    Medical: Not on file    Non-medical: Not on file  Tobacco Use  . Smoking status: Never Smoker  . Smokeless tobacco: Never Used  Substance and Sexual Activity  . Alcohol use: Yes    Comment: just on occassion, rarely  . Drug use: No  . Sexual activity: Yes  Lifestyle  . Physical activity:    Days per week: Not on file    Minutes per session: Not on file  . Stress: Not on file  Relationships  . Social connections:    Talks on phone: Not on file    Gets together: Not on file    Attends religious service: Not on file    Active member of club or organization: Not on file      Attends meetings of clubs or organizations: Not on file    Relationship status: Not on file  Other Topics Concern  . Not on file  Social History Narrative   Part time employment.   Non-denominational pastor.     Family History: The patient's family history includes Diabetes type II in his father; Heart disease in his father; Liver cancer in his mother.  ROS:   Please see the history of present illness.    All other systems reviewed and are negative.  EKGs/Labs/Other Studies Reviewed:    The following studies were reviewed today: I discussed my findings with the patient at extensive length.   Recent Labs: 05/10/2018: ALT 26; BUN 13; Creatinine, Ser 0.90; Hemoglobin 15.4; Platelets 82.0; Potassium 4.4; Sodium 134  Recent Lipid Panel    Component Value Date/Time   CHOL 161 05/10/2018 1033   TRIG 196.0 (H) 05/10/2018 1033   HDL 36.60 (L) 05/10/2018 1033   CHOLHDL 4 05/10/2018 1033   VLDL 39.2 05/10/2018 1033   LDLCALC 85 05/10/2018 1033   LDLDIRECT 97.0 05/10/2018 1033    Physical Exam:    VS:  BP 116/60 (BP Location: Right Arm, Patient Position: Sitting, Cuff Size: Normal)   Pulse 87   Ht 6' (1.829 m)   Wt 182 lb (82.6 kg)   SpO2 98%   BMI 24.68 kg/m     Wt Readings from Last 3 Encounters:  06/03/18 182 lb (82.6 kg)  05/10/18 182 lb (82.6 kg)  04/08/18 182 lb 12.8 oz (82.9 kg)     GEN: Patient is in no acute distress HEENT: Normal NECK: No JVD; No carotid bruits LYMPHATICS: No lymphadenopathy CARDIAC: S1 S2 regular, 2/6 systolic murmur at the apex. RESPIRATORY:  Clear to auscultation without rales, wheezing or rhonchi  ABDOMEN: Soft, non-tender, non-distended MUSCULOSKELETAL:  No edema; No deformity  SKIN: Warm and dry NEUROLOGIC:  Alert and oriented x 3 PSYCHIATRIC:  Normal affect    Signed, Jenean Lindau, MD  06/03/2018 2:26 PM    [place]

## 2018-06-03 NOTE — Patient Instructions (Signed)
Medication Instructions:  Your physician recommends that you continue on your current medications as directed. Please refer to the Current Medication list given to you today.  If you need a refill on your cardiac medications before your next appointment, please call your pharmacy.   Lab work: Your physician recommends that you return for lab work today: Tsh   If you have labs (blood work) drawn today and your tests are completely normal, you will receive your results only by: Marland Kitchen MyChart Message (if you have MyChart) OR . A paper copy in the mail If you have any lab test that is abnormal or we need to change your treatment, we will call you to review the results.  Testing/Procedures: Your physician has requested that you have an echocardiogram. Echocardiography is a painless test that uses sound waves to create images of your heart. It provides your doctor with information about the size and shape of your heart and how well your heart's chambers and valves are working. This procedure takes approximately one hour. There are no restrictions for this procedure.   Your physician has requested that you have en exercise stress myoview. For further information please visit [email or web address]. Please follow instruction sheet, as given.    Follow-Up: At [facility], you and your health needs are our priority.  As part of our continuing mission to provide you with exceptional heart care, we have created designated Provider Care Teams.  These Care Teams include your primary Cardiologist (physician) and Advanced Practice Providers (APPs -  Physician Assistants and Nurse Practitioners) who all work together to provide you with the care you need, when you need it. You will need a follow up appointment in 6 months.  Please call our office 2 months in advance to schedule this appointment.  You may see No primary care provider on file. or another member of our [employer] in [place]: Jenne Campus, MD . Shirlee More, MD  Any Other Special Instructions Will Be Listed Below (If Applicable).   Echocardiogram An echocardiogram is a procedure that uses painless sound waves (ultrasound) to produce an image of the heart. Images from an echocardiogram can provide important information about:  Signs of coronary artery disease (CAD).  Aneurysm detection. An aneurysm is a weak or damaged part of an artery wall that bulges out from the normal force of blood pumping through the body.  Heart size and shape. Changes in the size or shape of the heart can be associated with certain conditions, including heart failure, aneurysm, and CAD.  Heart muscle function.  Heart valve function.  Signs of a past heart attack.  Fluid buildup around the heart.  Thickening of the heart muscle.  A tumor or infectious growth around the heart valves. Tell a health care provider about:  Any allergies you have.  All medicines you are taking, including vitamins, herbs, eye drops, creams, and over-the-counter medicines.  Any blood disorders you have.  Any surgeries you have had.  Any medical conditions you have.  Whether you are pregnant or may be pregnant. What are the risks? Generally, this is a safe procedure. However, problems may occur, including:  Allergic reaction to dye (contrast) that may be used during the procedure. What happens before the procedure? No specific preparation is needed. You may eat and drink normally. What happens during the procedure?   An IV tube may be inserted into one of your veins.  You may receive contrast through this tube. A contrast is  an injection that improves the quality of the pictures from your heart.  A gel will be applied to your chest.  A wand-like tool (transducer) will be moved over your chest. The gel will help to transmit the sound waves from the transducer.  The sound waves will harmlessly bounce off of your heart to  allow the heart images to be captured in real-time motion. The images will be recorded on a computer. The procedure may vary among health care providers and hospitals. What happens after the procedure?  You may return to your normal, everyday life, including diet, activities, and medicines, unless your health care provider tells you not to do that. Summary  An echocardiogram is a procedure that uses painless sound waves (ultrasound) to produce an image of the heart.  Images from an echocardiogram can provide important information about the size and shape of your heart, heart muscle function, heart valve function, and fluid buildup around your heart.  You do not need to do anything to prepare before this procedure. You may eat and drink normally.  After the echocardiogram is completed, you may return to your normal, everyday life, unless your health care provider tells you not to do that. This information is not intended to replace advice given to you by your health care provider. Make sure you discuss any questions you have with your health care provider. Document Released: 03/17/2000 Document Revised: 04/22/2016 Document Reviewed: 04/22/2016 Elsevier Interactive Patient Education  2019 [place].   Cardiac Nuclear Scan A cardiac nuclear scan is a test that measures blood flow to the heart when a person is resting and when he or she is exercising. The test looks for problems such as:  Not enough blood reaching a portion of the heart.  The heart muscle not working normally. You may need this test if:  You have heart disease.  You have had abnormal lab results.  You have had heart surgery or a balloon procedure to open up blocked arteries (angioplasty).  You have chest pain.  You have shortness of breath. In this test, a radioactive dye (tracer) is injected into your bloodstream. After the tracer has traveled to your heart, an imaging device is used to measure how much of the  tracer is absorbed by or distributed to various areas of your heart. This procedure is usually done at a hospital and takes 2-4 hours. Tell a health care provider about:  Any allergies you have.  All medicines you are taking, including vitamins, herbs, eye drops, creams, and over-the-counter medicines.  Any problems you or family members have had with anesthetic medicines.  Any blood disorders you have.  Any surgeries you have had.  Any medical conditions you have.  Whether you are pregnant or may be pregnant. What are the risks? Generally, this is a safe procedure. However, problems may occur, including:  Serious chest pain and heart attack. This is only a risk if the stress portion of the test is done.  Rapid heartbeat.  Sensation of warmth in your chest. This usually passes quickly.  Allergic reaction to the tracer. What happens before the procedure?  Ask your health care provider about changing or stopping your regular medicines. This is especially important if you are taking diabetes medicines or blood thinners.  Follow instructions from your health care provider about eating or drinking restrictions.  Remove your jewelry on the day of the procedure. What happens during the procedure?  An IV will be inserted into one of your  veins.  Your health care provider will inject a small amount of radioactive tracer through the IV.  You will wait for 20-40 minutes while the tracer travels through your bloodstream.  Your heart activity will be monitored with an electrocardiogram (ECG).  You will lie down on an exam table.  Images of your heart will be taken for about 15-20 minutes.  You may also have a stress test. For this test, one of the following may be done: ? You will exercise on a treadmill or stationary bike. While you exercise, your heart's activity will be monitored with an ECG, and your blood pressure will be checked. ? You will be given medicines that will  increase blood flow to parts of your heart. This is done if you are unable to exercise.  When blood flow to your heart has peaked, a tracer will again be injected through the IV.  After 20-40 minutes, you will get back on the exam table and have more images taken of your heart.  Depending on the type of tracer used, scans may need to be repeated 3-4 hours later.  Your IV line will be removed when the procedure is over. The procedure may vary among health care providers and hospitals. What happens after the procedure?  Unless your health care provider tells you otherwise, you may return to your normal schedule, including diet, activities, and medicines.  Unless your health care provider tells you otherwise, you may increase your fluid intake. This will help to flush the contrast dye from your body. Drink enough fluid to keep your urine pale yellow.  Ask your health care provider, or the department that is doing the test: ? When will my results be ready? ? How will I get my results? Summary  A cardiac nuclear scan measures the blood flow to the heart when a person is resting and when he or she is exercising.  Tell your health care provider if you are pregnant.  Before the procedure, ask your health care provider about changing or stopping your regular medicines. This is especially important if you are taking diabetes medicines or blood thinners.  After the procedure, unless your health care provider tells you otherwise, increase your fluid intake. This will help flush the contrast dye from your body.  After the procedure, unless your health care provider tells you otherwise, you may return to your normal schedule, including diet, activities, and medicines. This information is not intended to replace advice given to you by your health care provider. Make sure you discuss any questions you have with your health care provider. Document Released: 04/14/2004 Document Revised: 09/03/2017  Document Reviewed: 09/03/2017 Elsevier Interactive Patient Education  2019 [employer].

## 2018-06-04 ENCOUNTER — Ambulatory Visit (HOSPITAL_BASED_OUTPATIENT_CLINIC_OR_DEPARTMENT_OTHER): Payer: Self-pay

## 2018-06-04 ENCOUNTER — Other Ambulatory Visit (HOSPITAL_BASED_OUTPATIENT_CLINIC_OR_DEPARTMENT_OTHER): Payer: Self-pay

## 2018-06-04 LAB — TSH: TSH: 1.44 u[IU]/mL (ref 0.450–4.500)

## 2018-06-05 ENCOUNTER — Telehealth (HOSPITAL_COMMUNITY): Payer: Self-pay | Admitting: *Deleted

## 2018-06-05 NOTE — Telephone Encounter (Signed)
Left message on voicemail per DPR in reference to upcoming appointment scheduled on 06/10/18 with detailed instructions given per Myocardial Perfusion Study Information Sheet for the test. LM to arrive 15 minutes early, and that it is imperative to arrive on time for appointment to keep from having the test rescheduled. If you need to cancel or reschedule your appointment, please call the office within 24 hours of your appointment. Failure to do so may result in a cancellation of your appointment, and a $50 no show fee. Phone number given for call back for any questions. Kirstie Peri

## 2018-06-10 ENCOUNTER — Encounter (HOSPITAL_COMMUNITY): Payer: Self-pay

## 2018-06-12 ENCOUNTER — Other Ambulatory Visit: Payer: Self-pay

## 2018-06-12 ENCOUNTER — Telehealth: Payer: Self-pay

## 2018-06-12 ENCOUNTER — Ambulatory Visit (HOSPITAL_BASED_OUTPATIENT_CLINIC_OR_DEPARTMENT_OTHER)
Admission: RE | Admit: 2018-06-12 | Discharge: 2018-06-12 | Disposition: A | Discharge status: Home / Self Care | Payer: Self-pay | Source: Ambulatory Visit | Attending: Cardiology | Admitting: Cardiology

## 2018-06-12 DIAGNOSIS — R0609 Other forms of dyspnea: Secondary | ICD-10-CM | POA: Insufficient documentation

## 2018-06-12 NOTE — Progress Notes (Signed)
  Echocardiogram 2D Echocardiogram has been performed.  Georgean Spainhower T Detravion Tester 06/12/2018, 8:58 AM

## 2018-06-12 NOTE — Telephone Encounter (Signed)
Called and left detailed voice message on patients phone regarding test results. 

## 2018-06-12 NOTE — Telephone Encounter (Signed)
-----   Message from Jenean Lindau, MD sent at 06/12/2018  1:34 PM EDT ----- The results of the study is unremarkable. Please inform patient. I will discuss in detail at next appointment. Cc  primary care/referring physician Jenean Lindau, MD 06/12/2018 1:34 PM

## 2018-06-17 DIAGNOSIS — H59099 Other disorders of unspecified eye following cataract surgery: Secondary | ICD-10-CM

## 2018-06-17 DIAGNOSIS — E113412 Type 2 diabetes mellitus with severe nonproliferative diabetic retinopathy with macular edema, left eye: Secondary | ICD-10-CM

## 2018-06-17 HISTORY — DX: Other disorders of unspecified eye following cataract surgery: H59.099

## 2018-06-17 HISTORY — DX: Type 2 diabetes mellitus with severe nonproliferative diabetic retinopathy with macular edema, left eye: E11.3412

## 2018-07-16 ENCOUNTER — Ambulatory Visit (INDEPENDENT_AMBULATORY_CARE_PROVIDER_SITE_OTHER): Payer: Self-pay | Admitting: Family Medicine

## 2018-07-16 ENCOUNTER — Encounter: Payer: Self-pay | Admitting: Family Medicine

## 2018-07-16 VITALS — Ht 72.0 in

## 2018-07-16 DIAGNOSIS — S39012A Strain of muscle, fascia and tendon of lower back, initial encounter: Secondary | ICD-10-CM

## 2018-07-16 DIAGNOSIS — E1165 Type 2 diabetes mellitus with hyperglycemia: Secondary | ICD-10-CM

## 2018-07-16 MED ORDER — CYCLOBENZAPRINE HCL 10 MG PO TABS: 10.0000 mg | ORAL_TABLET | Freq: Every day | ORAL | 0 refills | Status: DC

## 2018-07-16 NOTE — Progress Notes (Signed)
Established Patient Office Visit  Subjective:  Patient ID: Robert Bowen, male    DOB: October 23, 1955  Age: 63 y.o. MRN: [id number]  CC:  Chief Complaint  Patient presents with  . Follow-up    HPI Robert Bowen presents for follow-up of his diabetes.  Patient has been laid off due to the fact that his place of employment has closed.  He has been active at home out in the yard and walking with his wife and daughters.  Diabetes control has improved fasting sugars have been running in the 150s to 130s.  Surgery has been planned for his eyes soon.  While working in his yard he strained his back on the left side.  There is pain in his left upper back that is worse with range of motion and movement through the back.  There have been no changes in his urine flow hematuria fever chills nausea or vomiting.  He does feel muscle spasms and there is tenderness to the touch.  Mood is definitely elevated and has responded to the Celexa.  He is mostly feeling well.  Past Medical History:  Diagnosis Date  . Diabetes mellitus, type 2 ([place]) 1988  . Glaucoma   . Juvenile cataract of both eyes     Past Surgical History:  Procedure Laterality Date  . EYE SURGERY      Family History  Problem Relation Age of Onset  . Diabetes type II Father   . Heart disease Father   . Liver cancer Mother     Social History   Socioeconomic History  . Marital status: Married    Spouse name: Not on file  . Number of children: Not on file  . Years of education: Not on file  . Highest education level: Not on file  Occupational History  . Not on file  Social Needs  . Financial resource strain: Not on file  . Food insecurity:    Worry: Not on file    Inability: Not on file  . Transportation needs:    Medical: Not on file    Non-medical: Not on file  Tobacco Use  . Smoking status: Never Smoker  . Smokeless tobacco: Never Used  Substance and Sexual Activity  . Alcohol use: Yes    Comment: just on  occassion, rarely  . Drug use: No  . Sexual activity: Yes  Lifestyle  . Physical activity:    Days per week: Not on file    Minutes per session: Not on file  . Stress: Not on file  Relationships  . Social connections:    Talks on phone: Not on file    Gets together: Not on file    Attends religious service: Not on file    Active member of club or organization: Not on file    Attends meetings of clubs or organizations: Not on file    Relationship status: Not on file  . Intimate partner violence:    Fear of current or ex partner: Not on file    Emotionally abused: Not on file    Physically abused: Not on file    Forced sexual activity: Not on file  Other Topics Concern  . Not on file  Social History Narrative   Part time employment.   Non-denominational pastor.    Outpatient Medications Prior to Visit  Medication Sig Dispense Refill  . atorvastatin (LIPITOR) 20 MG tablet Take 1 tablet (20 mg total) by mouth daily. 90 tablet 3  .  citalopram (CELEXA) 40 MG tablet Take 1 tablet (40 mg total) by mouth daily. 90 tablet 2  . dapagliflozin propanediol (FARXIGA) 10 MG TABS tablet Take 10 mg by mouth daily. 90 tablet 1  . losartan (COZAAR) 25 MG tablet Take 1 tablet (25 mg total) by mouth daily. 100 tablet 1  . metFORMIN (GLUCOPHAGE-XR) 500 MG 24 hr tablet TAKE 2 TABLETS BY MOUTH 2 TIMES DAILY 360 tablet 2  . albuterol (PROVENTIL HFA;VENTOLIN HFA) 108 (90 Base) MCG/ACT inhaler Inhale 1-2 puffs into the lungs every 6 (six) hours as needed for wheezing or shortness of breath. 1 Inhaler 2   No facility-administered medications prior to visit.     No Known Allergies  ROS Review of Systems  Constitutional: Negative for diaphoresis, fatigue, fever and unexpected weight change.  HENT: Negative.   Eyes: Positive for visual disturbance.  Respiratory: Negative.   Cardiovascular: Negative.   Gastrointestinal: Negative.   Endocrine: Negative for polyphagia and polyuria.  Musculoskeletal:  Positive for back pain and myalgias.  Psychiatric/Behavioral: Negative.       Objective:    Physical Exam  Constitutional: He is oriented to person, place, and time. He appears well-developed and well-nourished. No distress.  HENT:  Head: Normocephalic and atraumatic.  Right Ear: External ear normal.  Left Ear: External ear normal.  Eyes: Right eye exhibits no discharge. Left eye exhibits no discharge. No scleral icterus.  Pulmonary/Chest: Effort normal.  Neurological: He is alert and oriented to person, place, and time.  Skin: He is not diaphoretic.  Psychiatric: He has a normal mood and affect. His behavior is normal.    Ht 6' (1.829 m)   BMI 24.68 kg/m  Wt Readings from Last 3 Encounters:  06/03/18 182 lb (82.6 kg)  05/10/18 182 lb (82.6 kg)  04/08/18 182 lb 12.8 oz (82.9 kg)     Health Maintenance Due  Topic Date Due  . Hepatitis C Screening  Nov 08, 1955  . PNEUMOCOCCAL POLYSACCHARIDE VACCINE AGE 68-64 HIGH RISK  11/20/1956  . OPHTHALMOLOGY EXAM  11/20/1964  . HIV Screening  11/20/1969  . COLONOSCOPY  11/20/2004  . FOOT EXAM  06/20/2017    There are no preventive care reminders to display for this patient.  Lab Results  Component Value Date   TSH 1.440 06/03/2018   Lab Results  Component Value Date   WBC 4.9 05/10/2018   HGB 15.4 05/10/2018   HCT 43.0 05/10/2018   MCV 83.7 05/10/2018   PLT 82.0 (L) 05/10/2018   Lab Results  Component Value Date   NA 134 (L) 05/10/2018   K 4.4 05/10/2018   CO2 28 05/10/2018   GLUCOSE 353 (H) 05/10/2018   BUN 13 05/10/2018   CREATININE 0.90 05/10/2018   BILITOT 1.2 05/10/2018   ALKPHOS 96 05/10/2018   AST 17 05/10/2018   ALT 26 05/10/2018   PROT 7.3 05/10/2018   ALBUMIN 4.6 05/10/2018   CALCIUM 9.5 05/10/2018   GFR 85.10 05/10/2018   Lab Results  Component Value Date   CHOL 161 05/10/2018   Lab Results  Component Value Date   HDL 36.60 (L) 05/10/2018   Lab Results  Component Value Date   LDLCALC 85  05/10/2018   Lab Results  Component Value Date   TRIG 196.0 (H) 05/10/2018   Lab Results  Component Value Date   CHOLHDL 4 05/10/2018   Lab Results  Component Value Date   HGBA1C 11.0 (H) 05/10/2018      Assessment & Plan:   Problem  List Items Addressed This Visit      Musculoskeletal and Integument   Strain of lumbar region   Relevant Medications   cyclobenzaprine (FLEXERIL) 10 MG tablet    Other Visit Diagnoses    Uncontrolled type 2 diabetes mellitus with hyperglycemia ([place])    -  Primary      Meds ordered this encounter  Medications  . cyclobenzaprine (FLEXERIL) 10 MG tablet    Sig: Take 1 tablet (10 mg total) by mouth at bedtime.    Dispense:  30 tablet    Refill:  0    Follow-up: Return in about 1 month (around 08/15/2018).    Libby Maw, MDVirtual Visit via Video Note  I connected with Robert Bowen on 07/16/18 at  8:30 AM EDT by a video enabled telemedicine application and verified that I am speaking with the correct person using two identifiers.   I discussed the limitations of evaluation and management by telemedicine and the availability of in person appointments. The patient expressed understanding and agreed to proceed.  History of Present Illness:    Observations/Objective:   Assessment and Plan:   Follow Up Instructions:    I discussed the assessment and treatment plan with the patient. The patient was provided an opportunity to ask questions and all were answered. The patient agreed with the plan and demonstrated an understanding of the instructions.   The patient was advised to call back or seek an in-person evaluation if the symptoms worsen or if the condition fails to improve as anticipated.  I provided 18 minutes of non-face-to-face time during this encounter.   Sounds as though patient is doing better with his diabetes control.  He will follow-up in a month and we will recheck the hemoglobin A1c.

## 2018-08-12 DIAGNOSIS — H4302 Vitreous prolapse, left eye: Secondary | ICD-10-CM | POA: Insufficient documentation

## 2018-08-12 HISTORY — DX: Vitreous prolapse, left eye: H43.02

## 2018-08-13 ENCOUNTER — Ambulatory Visit (INDEPENDENT_AMBULATORY_CARE_PROVIDER_SITE_OTHER): Payer: Self-pay | Admitting: Family Medicine

## 2018-08-13 ENCOUNTER — Encounter: Payer: Self-pay | Admitting: Family Medicine

## 2018-08-13 VITALS — Ht 72.0 in

## 2018-08-13 DIAGNOSIS — E1139 Type 2 diabetes mellitus with other diabetic ophthalmic complication: Secondary | ICD-10-CM

## 2018-08-13 MED ORDER — BASAGLAR KWIKPEN 100 UNIT/ML ~~LOC~~ SOPN: 12.0000 [IU] | PEN_INJECTOR | Freq: Every day | SUBCUTANEOUS | 5 refills | Status: DC

## 2018-08-13 NOTE — Progress Notes (Signed)
Virtual Visit via Telephone Note  I connected with Robert Bowen on 08/13/18 at  3:30 PM EDT by telephone and verified that I am speaking with the correct person using two identifiers.  Location: Patient: home Provider:    I discussed the limitations, risks, security and privacy concerns of performing an evaluation and management service by telephone and the availability of in person appointments. I also discussed with the patient that there may be a patient responsible charge related to this service. The patient expressed understanding and agreed to proceed.  Interactive video and audio telecommunications were attempted between myself and the patient. However they failed due to the patient having technical difficulties or not having access to video capability. We continued and completed with audio only.   Established Patient Office Visit  Subjective:  Patient ID: Robert Bowen, male    DOB: 09/02/1955  Age: 63 y.o. MRN: [id number]  CC:  Chief Complaint  Patient presents with  . Follow-up    HPI Robert Bowen presents for follow-up of his diabetes.  Last hemoglobin A1c on 7 February was 11.  He was asked to increase his Glucophage to 1000 mg twice daily.  He slowly weaned himself up to this level and is only been at that dosage over the last month or so.  Continues to take the [place].  Has increased his physical activity.  He asks about the possibility of using glimepiride again.  Past Medical History:  Diagnosis Date  . Diabetes mellitus, type 2 ([place]) 1988  . Glaucoma   . Juvenile cataract of both eyes     Past Surgical History:  Procedure Laterality Date  . EYE SURGERY      Family History  Problem Relation Age of Onset  . Diabetes type II Father   . Heart disease Father   . Liver cancer Mother     Social History   Socioeconomic History  . Marital status: Married    Spouse name: Not on file  . Number of children: Not on file  . Years of education: Not on  file  . Highest education level: Not on file  Occupational History  . Not on file  Social Needs  . Financial resource strain: Not on file  . Food insecurity:    Worry: Not on file    Inability: Not on file  . Transportation needs:    Medical: Not on file    Non-medical: Not on file  Tobacco Use  . Smoking status: Never Smoker  . Smokeless tobacco: Never Used  Substance and Sexual Activity  . Alcohol use: Yes    Comment: just on occassion, rarely  . Drug use: No  . Sexual activity: Yes  Lifestyle  . Physical activity:    Days per week: Not on file    Minutes per session: Not on file  . Stress: Not on file  Relationships  . Social connections:    Talks on phone: Not on file    Gets together: Not on file    Attends religious service: Not on file    Active member of club or organization: Not on file    Attends meetings of clubs or organizations: Not on file    Relationship status: Not on file  . Intimate partner violence:    Fear of current or ex partner: Not on file    Emotionally abused: Not on file    Physically abused: Not on file    Forced sexual activity: Not on  file  Other Topics Concern  . Not on file  Social History Narrative   Part time employment.   Non-denominational pastor.    Outpatient Medications Prior to Visit  Medication Sig Dispense Refill  . atorvastatin (LIPITOR) 20 MG tablet Take 1 tablet (20 mg total) by mouth daily. 90 tablet 3  . citalopram (CELEXA) 40 MG tablet Take 1 tablet (40 mg total) by mouth daily. 90 tablet 2  . cyclobenzaprine (FLEXERIL) 10 MG tablet Take 1 tablet (10 mg total) by mouth at bedtime. 30 tablet 0  . dapagliflozin propanediol (FARXIGA) 10 MG TABS tablet Take 10 mg by mouth daily. 90 tablet 1  . losartan (COZAAR) 25 MG tablet Take 1 tablet (25 mg total) by mouth daily. 100 tablet 1  . metFORMIN (GLUCOPHAGE-XR) 500 MG 24 hr tablet TAKE 2 TABLETS BY MOUTH 2 TIMES DAILY 360 tablet 2   No facility-administered medications  prior to visit.     No Known Allergies  ROS Review of Systems  Constitutional: Negative.   Respiratory: Negative.   Cardiovascular: Negative.   Gastrointestinal: Negative.   Endocrine: Negative for polyphagia and polyuria.  Genitourinary: Negative.       Objective:    Physical Exam  Constitutional: He is oriented to person, place, and time. No distress.  Pulmonary/Chest: Effort normal.  Neurological: He is alert and oriented to person, place, and time.  Psychiatric: He has a normal mood and affect. His behavior is normal.    Ht 6' (1.829 m)   BMI 24.68 kg/m  Wt Readings from Last 3 Encounters:  06/03/18 182 lb (82.6 kg)  05/10/18 182 lb (82.6 kg)  04/08/18 182 lb 12.8 oz (82.9 kg)     Health Maintenance Due  Topic Date Due  . Hepatitis C Screening  1955-09-01  . PNEUMOCOCCAL POLYSACCHARIDE VACCINE AGE 25-64 HIGH RISK  11/20/1956  . OPHTHALMOLOGY EXAM  11/20/1964  . HIV Screening  11/20/1969  . COLONOSCOPY  11/20/2004  . FOOT EXAM  06/20/2017    There are no preventive care reminders to display for this patient.  Lab Results  Component Value Date   TSH 1.440 06/03/2018   Lab Results  Component Value Date   WBC 4.9 05/10/2018   HGB 15.4 05/10/2018   HCT 43.0 05/10/2018   MCV 83.7 05/10/2018   PLT 82.0 (L) 05/10/2018   Lab Results  Component Value Date   NA 134 (L) 05/10/2018   K 4.4 05/10/2018   CO2 28 05/10/2018   GLUCOSE 353 (H) 05/10/2018   BUN 13 05/10/2018   CREATININE 0.90 05/10/2018   BILITOT 1.2 05/10/2018   ALKPHOS 96 05/10/2018   AST 17 05/10/2018   ALT 26 05/10/2018   PROT 7.3 05/10/2018   ALBUMIN 4.6 05/10/2018   CALCIUM 9.5 05/10/2018   GFR 85.10 05/10/2018   Lab Results  Component Value Date   CHOL 161 05/10/2018   Lab Results  Component Value Date   HDL 36.60 (L) 05/10/2018   Lab Results  Component Value Date   LDLCALC 85 05/10/2018   Lab Results  Component Value Date   TRIG 196.0 (H) 05/10/2018   Lab Results   Component Value Date   CHOLHDL 4 05/10/2018   Lab Results  Component Value Date   HGBA1C 11.0 (H) 05/10/2018      Assessment & Plan:   Problem List Items Addressed This Visit      Endocrine   Diabetes mellitus ([place]) - Primary   Relevant Medications   Insulin  Glargine (BASAGLAR KWIKPEN) 100 UNIT/ML SOPN      Meds ordered this encounter  Medications  . Insulin Glargine (BASAGLAR KWIKPEN) 100 UNIT/ML SOPN    Sig: Inject 0.12 mLs (12 Units total) into the skin at bedtime. Increase dosage by 2 units nightly until fasting glucose is < 140.    Dispense:  9 mL    Refill:  5    Please instruct patient and wife.    Follow-up: Return in about 2 months (around 10/13/2018), or if symptoms worsen or fail to improve.    Libby Maw, MD   History of Present Illness:    Observations/Objective:   Assessment and Plan:   Follow Up Instructions:    I discussed the assessment and treatment plan with the patient. The patient was provided an opportunity to ask questions and all were answered. The patient agreed with the plan and demonstrated an understanding of the instructions.   The patient was advised to call back or seek an in-person evaluation if the symptoms worsen or if the condition fails to improve as anticipated.  I provided  15 minutes of non-face-to-face time during this encounter.   We had a lengthy discussion about treatment options for his diabetes.  Glimepiride is not indicated for a number of reasons.  Patient will start Lantus.  We discussed titrating it upwards until his fasting sugars are in the less than 140 range.  Asked pharmacy to show both him and his wife how to use the pen.  We will follow-up in 2 months or sooner as needed with any questions or concerns

## 2018-10-01 MED FILL — BRIMONIDINE TARTRATE 0.2 %: 0.2 | 33 days supply | Qty: 5 | Fill #0

## 2018-10-01 MED FILL — PROLENSA 0.07% EYE DROPS: 0.07 | 3 days supply | Qty: 3 | Fill #0

## 2018-10-20 ENCOUNTER — Telehealth: Payer: Self-pay | Admitting: Nurse Practitioner

## 2018-10-20 DIAGNOSIS — J0100 Acute maxillary sinusitis, unspecified: Secondary | ICD-10-CM

## 2018-10-20 MED ORDER — AMOXICILLIN-POT CLAVULANATE 875-125 MG PO TABS: 1.0000 | ORAL_TABLET | Freq: Two times a day (BID) | ORAL | 0 refills | Status: DC

## 2018-10-20 NOTE — Progress Notes (Signed)

## 2018-11-11 ENCOUNTER — Encounter: Payer: Self-pay | Admitting: Family Medicine

## 2018-11-11 DIAGNOSIS — G4700 Insomnia, unspecified: Secondary | ICD-10-CM

## 2018-11-12 MED ORDER — TRAZODONE HCL 50 MG PO TABS: 25.0000 mg | ORAL_TABLET | Freq: Every evening | ORAL | 1 refills | Status: DC | PRN

## 2018-11-19 ENCOUNTER — Encounter: Payer: Self-pay | Admitting: Family Medicine

## 2018-11-19 DIAGNOSIS — J019 Acute sinusitis, unspecified: Secondary | ICD-10-CM

## 2018-11-20 MED ORDER — AMOXICILLIN-POT CLAVULANATE 875-125 MG PO TABS: 1.0000 | ORAL_TABLET | Freq: Two times a day (BID) | ORAL | 0 refills | Status: DC

## 2018-11-21 MED FILL — PROLENSA 0.07% EYE DROPS: 0.07 | 3 days supply | Qty: 3 | Fill #1

## 2018-11-26 ENCOUNTER — Telehealth: Payer: Self-pay | Admitting: Family Medicine

## 2018-11-26 NOTE — Telephone Encounter (Signed)
Tried to call pt to set up 3 month follow up based on last appt, left message

## 2018-12-04 ENCOUNTER — Ambulatory Visit: Payer: Self-pay | Admitting: Cardiology

## 2018-12-30 MED FILL — PREDNISOLONE AC 1% EYE DROP: 1 | 33 days supply | Qty: 5 | Fill #0

## 2019-01-04 ENCOUNTER — Other Ambulatory Visit: Payer: Self-pay | Admitting: Family Medicine

## 2019-01-04 DIAGNOSIS — G4700 Insomnia, unspecified: Secondary | ICD-10-CM

## 2019-01-08 ENCOUNTER — Telehealth: Payer: Self-pay

## 2019-01-08 NOTE — Telephone Encounter (Signed)
lmovm for pt letting him know that his forms are complete & up front for pick up.

## 2019-01-20 MED FILL — PREDNISOLONE AC 1% EYE DROP: 1 | 33 days supply | Qty: 5 | Fill #1

## 2019-01-26 ENCOUNTER — Other Ambulatory Visit: Payer: Self-pay | Admitting: Family Medicine

## 2019-01-26 DIAGNOSIS — F419 Anxiety disorder, unspecified: Secondary | ICD-10-CM

## 2019-01-26 DIAGNOSIS — F32A Depression, unspecified: Secondary | ICD-10-CM

## 2019-01-26 DIAGNOSIS — F329 Major depressive disorder, single episode, unspecified: Secondary | ICD-10-CM

## 2019-02-04 DIAGNOSIS — T85328A Displacement of other ocular prosthetic devices, implants and grafts, initial encounter: Secondary | ICD-10-CM

## 2019-02-04 HISTORY — DX: Displacement of other ocular prosthetic devices, implants and grafts, initial encounter: T85.328A

## 2019-02-11 ENCOUNTER — Other Ambulatory Visit: Payer: Self-pay | Admitting: Family Medicine

## 2019-02-11 DIAGNOSIS — G4700 Insomnia, unspecified: Secondary | ICD-10-CM

## 2019-02-12 MED ORDER — TRAZODONE HCL 50 MG PO TABS: 50.0000 mg | ORAL_TABLET | Freq: Every day | ORAL | 1 refills | Status: DC

## 2019-04-11 ENCOUNTER — Other Ambulatory Visit: Payer: Self-pay | Admitting: Family Medicine

## 2019-04-11 DIAGNOSIS — E1139 Type 2 diabetes mellitus with other diabetic ophthalmic complication: Secondary | ICD-10-CM

## 2019-04-11 DIAGNOSIS — R030 Elevated blood-pressure reading, without diagnosis of hypertension: Secondary | ICD-10-CM

## 2019-04-11 DIAGNOSIS — E119 Type 2 diabetes mellitus without complications: Secondary | ICD-10-CM

## 2019-04-11 DIAGNOSIS — G4700 Insomnia, unspecified: Secondary | ICD-10-CM

## 2019-04-11 NOTE — Telephone Encounter (Signed)
Called pt to inform him that Rx was sent to pharmacy and to also schedule pt to come in for f/u appt on meds. No answer LMTCB

## 2019-04-14 NOTE — Telephone Encounter (Signed)
Sent pt a mychart message that an office visit is needed

## 2019-04-15 NOTE — Telephone Encounter (Signed)
Someone scheduled pt to come in on 04/18/2019

## 2019-04-17 ENCOUNTER — Other Ambulatory Visit: Payer: Self-pay

## 2019-04-18 ENCOUNTER — Ambulatory Visit: Payer: Self-pay | Admitting: Family Medicine

## 2019-04-18 ENCOUNTER — Encounter: Payer: Self-pay | Admitting: Family Medicine

## 2019-04-18 VITALS — BP 114/66 | HR 63 | Temp 97.7°F | Wt 177.8 lb

## 2019-04-18 DIAGNOSIS — D696 Thrombocytopenia, unspecified: Secondary | ICD-10-CM

## 2019-04-18 DIAGNOSIS — E1165 Type 2 diabetes mellitus with hyperglycemia: Secondary | ICD-10-CM

## 2019-04-18 DIAGNOSIS — E1139 Type 2 diabetes mellitus with other diabetic ophthalmic complication: Secondary | ICD-10-CM

## 2019-04-18 DIAGNOSIS — Z23 Encounter for immunization: Secondary | ICD-10-CM

## 2019-04-18 DIAGNOSIS — F419 Anxiety disorder, unspecified: Secondary | ICD-10-CM

## 2019-04-18 DIAGNOSIS — Z794 Long term (current) use of insulin: Secondary | ICD-10-CM

## 2019-04-18 DIAGNOSIS — E119 Type 2 diabetes mellitus without complications: Secondary | ICD-10-CM

## 2019-04-18 DIAGNOSIS — F329 Major depressive disorder, single episode, unspecified: Secondary | ICD-10-CM

## 2019-04-18 DIAGNOSIS — E7800 Pure hypercholesterolemia, unspecified: Secondary | ICD-10-CM

## 2019-04-18 HISTORY — DX: Type 2 diabetes mellitus with hyperglycemia: E11.65

## 2019-04-18 LAB — CBC
HCT: 43.7 % (ref 39.0–52.0)
Hemoglobin: 15.5 g/dL (ref 13.0–17.0)
MCHC: 35.4 g/dL (ref 30.0–36.0)
MCV: 91.6 fl (ref 78.0–100.0)
Platelets: 97 10*3/uL — ABNORMAL LOW (ref 150.0–400.0)
RBC: 4.77 Mil/uL (ref 4.22–5.81)
RDW: 13.2 % (ref 11.5–15.5)
WBC: 5.6 10*3/uL (ref 4.0–10.5)

## 2019-04-18 LAB — COMPREHENSIVE METABOLIC PANEL
ALT: 19 U/L (ref 0–53)
AST: 16 U/L (ref 0–37)
Albumin: 4.8 g/dL (ref 3.5–5.2)
Alkaline Phosphatase: 85 U/L (ref 39–117)
BUN: 16 mg/dL (ref 6–23)
CO2: 26 mEq/L (ref 19–32)
Calcium: 9.2 mg/dL (ref 8.4–10.5)
Chloride: 103 mEq/L (ref 96–112)
Creatinine, Ser: 0.95 mg/dL (ref 0.40–1.50)
GFR: 79.71 mL/min (ref >60.00)
Glucose, Bld: 175 mg/dL — ABNORMAL HIGH (ref 70–99)
Potassium: 3.8 mEq/L (ref 3.5–5.1)
Sodium: 138 mEq/L (ref 135–145)
Total Bilirubin: 1.2 mg/dL (ref 0.2–1.2)
Total Protein: 7.3 g/dL (ref 6.0–8.3)

## 2019-04-18 LAB — URINALYSIS, ROUTINE W REFLEX MICROSCOPIC
Bilirubin Urine: NEGATIVE
Hgb urine dipstick: NEGATIVE
Ketones, ur: NEGATIVE
Leukocytes,Ua: NEGATIVE
Nitrite: NEGATIVE
RBC / HPF: NONE SEEN (ref 0–?)
Specific Gravity, Urine: 1.02 (ref 1.000–1.030)
Total Protein, Urine: NEGATIVE
Urine Glucose: >=1000 — AB
Urobilinogen, UA: 0.2 (ref 0.0–1.0)
WBC, UA: NONE SEEN (ref 0–?)
pH: 5 (ref 5.0–8.0)

## 2019-04-18 LAB — MICROALBUMIN / CREATININE URINE RATIO
Creatinine,U: 49.2 mg/dL
Microalb Creat Ratio: 1.4 mg/g (ref 0.0–30.0)
Microalb, Ur: <0.7 mg/dL (ref 0.0–1.9)

## 2019-04-18 LAB — LIPID PANEL
Cholesterol: 117 mg/dL (ref 0–200)
HDL: 36.3 mg/dL — ABNORMAL LOW (ref >39.00)
LDL Cholesterol: 64 mg/dL (ref 0–99)
NonHDL: 81.06
Total CHOL/HDL Ratio: 3
Triglycerides: 86 mg/dL (ref 0.0–149.0)
VLDL: 17.2 mg/dL (ref 0.0–40.0)

## 2019-04-18 LAB — HEMOGLOBIN A1C: Hgb A1c MFr Bld: 7.4 % — ABNORMAL HIGH (ref 4.6–6.5)

## 2019-04-18 MED ORDER — LOSARTAN POTASSIUM 25 MG PO TABS: 25.0000 mg | ORAL_TABLET | Freq: Every day | ORAL | 1 refills | Status: DC

## 2019-04-18 MED ORDER — ATORVASTATIN CALCIUM 20 MG PO TABS: 20.0000 mg | ORAL_TABLET | Freq: Every day | ORAL | 3 refills | Status: DC

## 2019-04-18 MED ORDER — METFORMIN HCL ER 500 MG PO TB24: ORAL_TABLET | ORAL | 2 refills | Status: DC

## 2019-04-18 MED ORDER — DAPAGLIFLOZIN PROPANEDIOL 10 MG PO TABS: 10.0000 mg | ORAL_TABLET | Freq: Every day | ORAL | 1 refills | Status: DC

## 2019-04-18 NOTE — Progress Notes (Addendum)
Established Patient Office Visit  Subjective:  Patient ID: Robert Bowen, male    DOB: 1955/10/19  Age: 64 y.o. MRN: [id number]  CC:  Chief Complaint  Patient presents with  . Diabetes    Patient is here today to F/U with DM.  He is a Self-Pay patient. On 2.7.20 his A1C had risen to 11.0. He is currently fasting. He does not check his BS at home. He is not on Basaglar as he cannot afford it.  He agrees to get flu shot.    HPI Robert Bowen presents for follow-up of his type II uncontrolled diabetes, elevated cholesterol anxiety and depression.  Unfortunately he was unable to afford the [place].  He has been taking the [place] and Metformin daily.  Sugars have still been elevated.  Secretagogues are not an appropriate choice for him at this point.  He needs insulin.  Has lost some weight.  Blood pressure is lower.  Anxiety depression well-controlled.  Multiple visits including cornea transplants with the ophthalmologist.  He is accompanied by his wife today.  Out of work.  Hard for him to remain active outside to the light sensitivity.  Past Medical History:  Diagnosis Date  . Diabetes mellitus, type 2 ([place]) 1988  . Glaucoma   . Juvenile cataract of both eyes     Past Surgical History:  Procedure Laterality Date  . EYE SURGERY      Family History  Problem Relation Age of Onset  . Diabetes type II Father   . Heart disease Father   . Liver cancer Mother     Social History   Socioeconomic History  . Marital status: Married    Spouse name: Not on file  . Number of children: Not on file  . Years of education: Not on file  . Highest education level: Not on file  Occupational History  . Not on file  Tobacco Use  . Smoking status: Never Smoker  . Smokeless tobacco: Never Used  Substance and Sexual Activity  . Alcohol use: Yes    Comment: just on occassion, rarely  . Drug use: No  . Sexual activity: Yes  Other Topics Concern  . Not on file  Social History  Narrative   Part time employment.   Non-denominational pastor.   Social Determinants of Health   Financial Resource Strain:   . Difficulty of Paying Living Expenses: Not on file  Food Insecurity:   . Worried About Charity fundraiser in the Last Year: Not on file  . Ran Out of Food in the Last Year: Not on file  Transportation Needs:   . Lack of Transportation (Medical): Not on file  . Lack of Transportation (Non-Medical): Not on file  Physical Activity:   . Days of Exercise per Week: Not on file  . Minutes of Exercise per Session: Not on file  Stress:   . Feeling of Stress : Not on file  Social Connections:   . Frequency of Communication with Friends and Family: Not on file  . Frequency of Social Gatherings with Friends and Family: Not on file  . Attends Religious Services: Not on file  . Active Member of Clubs or Organizations: Not on file  . Attends Archivist Meetings: Not on file  . Marital Status: Not on file  Intimate Partner Violence:   . Fear of Current or Ex-Partner: Not on file  . Emotionally Abused: Not on file  . Physically Abused: Not on file  .  Sexually Abused: Not on file    Outpatient Medications Prior to Visit  Medication Sig Dispense Refill  . citalopram (CELEXA) 40 MG tablet TAKE ONE TABLET BY MOUTH ONE TIME DAILY  90 tablet 0  . dorzolamide-timolol (COSOPT) 22.3-6.8 MG/ML ophthalmic solution Place 1 drop into the left eye 2 times daily.    . hypromellose (GENTEAL) 0.3 % GEL ophthalmic ointment     . neomycin-polymyxin-dexameth (MAXITROL) 0.1 % OINT Place into the left eye nightly.    . prednisoLONE acetate (PRED FORTE) 1 % ophthalmic suspension Place 1 drop into the left eye 4 times daily.    . traZODone (DESYREL) 50 MG tablet TAKE ONE TABLET BY MOUTH DAILY AT BEDTIME  30 tablet 1  . valACYclovir (VALTREX) 500 MG tablet Take by mouth.    Marland Kitchen atorvastatin (LIPITOR) 20 MG tablet Take 1 tablet (20 mg total) by mouth daily. 90 tablet 3  .  dapagliflozin propanediol (FARXIGA) 10 MG TABS tablet Take 10 mg by mouth daily. 90 tablet 1  . Insulin Glargine (BASAGLAR KWIKPEN) 100 UNIT/ML SOPN Inject 0.12 mLs (12 Units total) into the skin at bedtime. Increase dosage by 2 units nightly until fasting glucose is < 140. 9 mL 5  . losartan (COZAAR) 50 MG tablet TAKE ONE TABLET BY MOUTH ONE TIME DAILY  90 tablet 0  . metFORMIN (GLUCOPHAGE-XR) 500 MG 24 hr tablet TAKE 2 TABLETS BY MOUTH 2 TIMES DAILY 360 tablet 2  . amoxicillin-clavulanate (AUGMENTIN) 875-125 MG tablet Take 1 tablet by mouth 2 (two) times daily. 20 tablet 0  . cyclobenzaprine (FLEXERIL) 10 MG tablet Take 1 tablet (10 mg total) by mouth at bedtime. 30 tablet 0  . losartan (COZAAR) 25 MG tablet Take 1 tablet (25 mg total) by mouth daily. 100 tablet 1   No facility-administered medications prior to visit.    No Known Allergies  ROS Review of Systems  Constitutional: Negative.   HENT: Negative.   Eyes: Positive for photophobia and visual disturbance.  Respiratory: Negative.   Cardiovascular: Negative.   Gastrointestinal: Negative.   Endocrine: Negative for polyphagia and polyuria.  Genitourinary: Negative for difficulty urinating, frequency and urgency.  Musculoskeletal: Negative for gait problem and joint swelling.  Skin: Negative for pallor and rash.  Neurological: Negative for speech difficulty and numbness.  Hematological: Does not bruise/bleed easily.     Office Visit from 04/18/2019 in [facility]  PHQ-2 Total Score  0       Objective:    Physical Exam  Constitutional: He is oriented to person, place, and time. He appears well-developed and well-nourished. No distress.  HENT:  Head: Normocephalic and atraumatic.  Right Ear: External ear normal.  Left Ear: External ear normal.  Eyes: Right eye exhibits no discharge. Left eye exhibits no discharge. No scleral icterus.  Neck: No JVD present. No tracheal deviation present.    Cardiovascular: Normal rate, regular rhythm and normal heart sounds.  Pulses:      Dorsalis pedis pulses are 2+ on the right side and 1+ on the left side.       Posterior tibial pulses are 1+ on the right side and 1+ on the left side.  Pulmonary/Chest: Effort normal and breath sounds normal. No stridor.  Musculoskeletal:        General: No edema.  Neurological: He is alert and oriented to person, place, and time.  Skin: Skin is warm and dry. He is not diaphoretic.  Psychiatric: He has a normal mood and affect.  His behavior is normal.   Diabetic Foot Exam - Simple   Simple Foot Form Visual Inspection See comments: Yes Sensation Testing Intact to touch and monofilament testing bilaterally: Yes Pulse Check See comments: Yes Comments Right foot is cavus.  Left arch is relaxed.  Pulses are diminished in the left foot.  Sensitivity to light touch intact bilaterally.    BP 114/66 (BP Location: Left Arm, Patient Position: Sitting, Cuff Size: Normal)   Pulse 63   Temp 97.7 F (36.5 C) (Oral)   Wt 177 lb 12.8 oz (80.6 kg)   SpO2 98%   BMI 24.11 kg/m  Wt Readings from Last 3 Encounters:  04/18/19 177 lb 12.8 oz (80.6 kg)  06/03/18 182 lb (82.6 kg)  05/10/18 182 lb (82.6 kg)     Health Maintenance Due  Topic Date Due  . Hepatitis C Screening  21-Feb-1956  . OPHTHALMOLOGY EXAM  11/20/1964  . HIV Screening  11/20/1969  . COLONOSCOPY  11/20/2004  . FOOT EXAM  06/20/2017    There are no preventive care reminders to display for this patient.  Lab Results  Component Value Date   TSH 1.440 06/03/2018   Lab Results  Component Value Date   WBC 5.6 04/18/2019   HGB 15.5 04/18/2019   HCT 43.7 04/18/2019   MCV 91.6 04/18/2019   PLT 97.0 (L) 04/18/2019   Lab Results  Component Value Date   NA 138 04/18/2019   K 3.8 04/18/2019   CO2 26 04/18/2019   GLUCOSE 175 (H) 04/18/2019   BUN 16 04/18/2019   CREATININE 0.95 04/18/2019   BILITOT 1.2 04/18/2019   ALKPHOS 85 04/18/2019    AST 16 04/18/2019   ALT 19 04/18/2019   PROT 7.3 04/18/2019   ALBUMIN 4.8 04/18/2019   CALCIUM 9.2 04/18/2019   GFR 79.71 04/18/2019   Lab Results  Component Value Date   CHOL 117 04/18/2019   Lab Results  Component Value Date   HDL 36.30 (L) 04/18/2019   Lab Results  Component Value Date   LDLCALC 64 04/18/2019   Lab Results  Component Value Date   TRIG 86.0 04/18/2019   Lab Results  Component Value Date   CHOLHDL 3 04/18/2019   Lab Results  Component Value Date   HGBA1C 7.4 (H) 04/18/2019      Assessment & Plan:   Problem List Items Addressed This Visit      Endocrine   Diabetes mellitus ([place])   Relevant Medications   atorvastatin (LIPITOR) 20 MG tablet   dapagliflozin propanediol (FARXIGA) 10 MG TABS tablet   metFORMIN (GLUCOPHAGE-XR) 500 MG 24 hr tablet   losartan (COZAAR) 25 MG tablet   Controlled type 2 diabetes mellitus without complication, without long-term current use of insulin (H[facility])   Relevant Medications   atorvastatin (LIPITOR) 20 MG tablet   dapagliflozin propanediol (FARXIGA) 10 MG TABS tablet   metFORMIN (GLUCOPHAGE-XR) 500 MG 24 hr tablet   losartan (COZAAR) 25 MG tablet   Uncontrolled type 2 diabetes mellitus with hyperglycemia ([facility])   Relevant Medications   atorvastatin (LIPITOR) 20 MG tablet   dapagliflozin propanediol (FARXIGA) 10 MG TABS tablet   metFORMIN (GLUCOPHAGE-XR) 500 MG 24 hr tablet   losartan (COZAAR) 25 MG tablet   Other Relevant Orders   CBC (Completed)   Comp Met (CMET) (Completed)   HgB A1c (Completed)   Urinalysis, Routine w reflex microscopic (Completed)   Urine Microalbumin w/creat. ratio (Completed)   Ambulatory referral to Endocrinology  Other   Anxiety and depression   Need for influenza vaccination - Primary   Relevant Orders   Flu Vaccine QUAD 6+ mos PF IM (Fluarix Quad PF) (Completed)   Elevated LDL cholesterol level   Relevant Medications   atorvastatin (LIPITOR) 20 MG tablet   Other Relevant  Orders   Lipid Profile (Completed)   Thrombocytopenia ([place])      Meds ordered this encounter  Medications  . atorvastatin (LIPITOR) 20 MG tablet    Sig: Take 1 tablet (20 mg total) by mouth daily.    Dispense:  90 tablet    Refill:  3  . dapagliflozin propanediol (FARXIGA) 10 MG TABS tablet    Sig: Take 10 mg by mouth daily.    Dispense:  90 tablet    Refill:  1  . metFORMIN (GLUCOPHAGE-XR) 500 MG 24 hr tablet    Sig: TAKE 2 TABLETS BY MOUTH 2 TIMES DAILY    Dispense:  360 tablet    Refill:  2  . losartan (COZAAR) 25 MG tablet    Sig: Take 1 tablet (25 mg total) by mouth daily.    Dispense:  100 tablet    Refill:  1    Follow-up: Return in about 6 months (around 10/16/2019).    Libby Maw, MD

## 2019-04-21 DIAGNOSIS — D696 Thrombocytopenia, unspecified: Secondary | ICD-10-CM | POA: Insufficient documentation

## 2019-04-21 HISTORY — DX: Thrombocytopenia, unspecified: D69.6

## 2019-05-19 ENCOUNTER — Other Ambulatory Visit (HOSPITAL_COMMUNITY): Payer: Self-pay | Admitting: Ophthalmology

## 2019-05-21 MED FILL — PREDNISOLONE AC 1% EYE DROP: 1 | 33 days supply | Qty: 5 | Fill #2

## 2019-06-12 ENCOUNTER — Other Ambulatory Visit: Payer: Self-pay | Admitting: Family Medicine

## 2019-06-12 DIAGNOSIS — G4700 Insomnia, unspecified: Secondary | ICD-10-CM

## 2019-06-25 ENCOUNTER — Other Ambulatory Visit: Payer: Self-pay

## 2019-06-26 ENCOUNTER — Ambulatory Visit: Payer: Self-pay | Admitting: Internal Medicine

## 2019-06-26 VITALS — BP 116/64 | HR 64 | Temp 98.2°F | Ht 72.0 in | Wt 178.2 lb

## 2019-06-26 DIAGNOSIS — E1165 Type 2 diabetes mellitus with hyperglycemia: Secondary | ICD-10-CM

## 2019-06-26 DIAGNOSIS — H547 Unspecified visual loss: Secondary | ICD-10-CM

## 2019-06-26 DIAGNOSIS — E113592 Type 2 diabetes mellitus with proliferative diabetic retinopathy without macular edema, left eye: Secondary | ICD-10-CM

## 2019-06-26 HISTORY — DX: Type 2 diabetes mellitus with proliferative diabetic retinopathy without macular edema, left eye: E11.3592

## 2019-06-26 HISTORY — DX: Type 2 diabetes mellitus with hyperglycemia: E11.65

## 2019-06-26 HISTORY — DX: Unspecified visual loss: H54.7

## 2019-06-26 LAB — GLUCOSE, POCT (MANUAL RESULT ENTRY): POC Glucose: 205 mg/dl — AB (ref 70–99)

## 2019-06-26 MED ORDER — METFORMIN HCL ER 750 MG PO TB24: 750.0000 mg | ORAL_TABLET | Freq: Two times a day (BID) | ORAL | 3 refills | Status: DC

## 2019-06-26 MED ORDER — GLIPIZIDE 5 MG PO TABS: 5.0000 mg | ORAL_TABLET | Freq: Every day | ORAL | 1 refills | Status: DC

## 2019-06-26 NOTE — Progress Notes (Signed)
Name: Robert Bowen  MRN/ DOB: [id number], 03/05/1956   Age/ Sex: 64 y.o., male    PCP: Libby Maw, MD   Reason for Endocrinology Evaluation: Type 2 Diabetes Mellitus     Date of Initial Endocrinology Visit: 06/26/2019     PATIENT IDENTIFIER: Mr. Robert Bowen is a 64 y.o. male with a past medical history of T2DM, HTN and Congenital right eye blindness. The patient presented for initial endocrinology clinic visit on 06/26/2019 for consultative assistance with his diabetes management.    HPI: Robert Bowen is accompanied by his wife today   Diagnosed with DM in  1988 Prior Medications tried/Intolerance: Glyburide  Currently checking blood sugars occasionally Hypoglycemia episodes : no               Hemoglobin A1c has ranged from 6.6% in 2018, peaking at 11.0%in 2020. Patient required assistance for hypoglycemia: no Patient has required hospitalization within the last 1 year from hyper or hypoglycemia: no   In terms of diet, the patient eats 2- 3 meals , snacks 2x a day. 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 Diabetes mellitus, type 2 ([place]) 1988  . Glaucoma   . Juvenile cataract of both eyes    Past Surgical History:  Past Surgical History:  Procedure Laterality Date  . EYE SURGERY        Social History:  reports that he has never smoked. He has never used smokeless  tobacco. He reports current alcohol use. He reports that he does not use drugs. Family History:  Family History  Problem Relation Age of Onset  . Diabetes type II Father   . Heart disease Father   . Liver cancer Mother      HOME MEDICATIONS: Allergies as of 06/26/2019   No Known Allergies     Medication List       Accurate as of June 26, 2019  9:01 AM. If you have any questions, ask your nurse or doctor.        Accu-Chek Aviva device by Other route. Use as instructed once daily   atorvastatin 20 MG tablet Commonly known as: LIPITOR Take 1 tablet (20 mg total) by mouth daily.   citalopram 40 MG tablet Commonly known as: CELEXA TAKE ONE TABLET BY MOUTH ONE TIME DAILY   dapagliflozin propanediol 10 MG Tabs tablet Commonly known as: FARXIGA Take 10 mg by mouth daily.   dorzolamide-timolol 22.3-6.8 MG/ML ophthalmic solution Commonly known as: COSOPT Place 1 drop into the left eye 2 times daily.   hypromellose 0.3 % Gel ophthalmic ointment Commonly known as: GENTEAL   losartan 25 MG tablet Commonly known as: Cozaar Take 1 tablet (25 mg total) by mouth daily.   metFORMIN 500 MG 24 hr tablet Commonly known as: GLUCOPHAGE-XR TAKE 2 TABLETS BY MOUTH 2 TIMES DAILY What changed: additional instructions   multivitamin tablet Take 1 tablet  by mouth daily.   neomycin-polymyxin-dexameth 0.1 % Oint Commonly known as: MAXITROL Place into the left eye nightly.   prednisoLONE acetate 1 % ophthalmic suspension Commonly known as: PRED FORTE Place 1 drop into the left eye 4 times daily.   traZODone 50 MG tablet Commonly known as: DESYREL TAKE ONE TABLET BY MOUTH DAILY AT BEDTIME   valACYclovir 500 MG tablet Commonly known as: VALTREX Take by mouth.   VITAMIN B COMPLEX-C PO Take by mouth.        ALLERGIES: No Known Allergies   REVIEW OF SYSTEMS: A comprehensive ROS was conducted with the patient and is negative except as per HPI and below:  Review of  Systems  Gastrointestinal: Negative for diarrhea and nausea.  Genitourinary: Negative for frequency.  Endo/Heme/Allergies: Negative for polydipsia.      OBJECTIVE:   VITAL SIGNS: BP 116/64 (BP Location: Right Arm, Patient Position: Sitting, Cuff Size: Large)   Pulse 64   Temp 98.2 F (36.8 C)   Ht 6' (1.829 m)   Wt 178 lb 3.2 oz (80.8 kg)   SpO2 98%   BMI 24.17 kg/m    PHYSICAL EXAM:  General: Pt appears well and is in NAD  Neck: General: Supple without adenopathy or carotid bruits. Thyroid: Thyroid size normal.  No goiter or nodules appreciated. No thyroid bruit.  Lungs: Clear with good BS bilat with no rales, rhonchi, or wheezes  Heart: RRR with normal S1 and S2 and no gallops; no murmurs; no rub  Extremities:  Lower extremities - No pretibial edema. No lesions.  Skin: Normal texture and temperature to palpation.    Neuro: MS is good with appropriate affect, pt is alert and Ox3    DM foot exam: 06/26/2019  The skin of the feet is intact without sores or ulcerations. The pedal pulses are 2+ on right and 2+ on left. The sensation is intact to a screening 5.07, 10 gram monofilament bilaterally    DATA REVIEWED:  Lab Results  Component Value Date   HGBA1C 7.4 (H) 04/18/2019   HGBA1C 11.0 (H) 05/10/2018   HGBA1C 8.1 (H) 01/15/2018   Lab Results  Component Value Date   MICROALBUR <0.7 04/18/2019   LDLCALC 64 04/18/2019   CREATININE 0.95 04/18/2019   Lab Results  Component Value Date   MICRALBCREAT 1.4 04/18/2019    Lab Results  Component Value Date   CHOL 117 04/18/2019   HDL 36.30 (L) 04/18/2019   LDLCALC 64 04/18/2019   LDLDIRECT 97.0 05/10/2018   TRIG 86.0 04/18/2019   CHOLHDL 3 04/18/2019        ASSESSMENT / PLAN / RECOMMENDATIONS:   1) Type 2 Diabetes Mellitus, suboptimally controlled, With retinopathic complications - Most recent A1c of 7.4%. Goal A1c <7.0%.    Plan: GENERAL: I have discussed with the patient the pathophysiology of diabetes.  We went over the natural progression of the disease. We talked about both insulin resistance and insulin deficiency. We stressed the importance of lifestyle changes including diet and exercise. I explained the complications associated with diabetes including retinopathy, nephropathy, neuropathy as well as increased risk of cardiovascular disease. We went over the benefit seen with glycemic control.   Patient does not have health insurance, this is something we have to keep in mind during his management.  He is able to get the [place] through the company through patient assistance program  We have discussed adding a sulfonylurea, we did discuss the importance of taking this with first meal of  the day, we did discuss the risk of hypoglycemia and weight gain.  MEDICATIONS: - Start GlipiZide 5 mg, 1 tablet before Breakfast  - Continue Metformin 750 mg Twice daily with meals - Continue Farxiga 10 mg daily with Breakfast   EDUCATION / INSTRUCTIONS:  BG monitoring instructions: Patient is instructed to check his blood sugars 1 times a day.  Call [facility] if: BG persistently < 70 or > 300. . I reviewed the Rule of 15 for the treatment of hypoglycemia in detail with the patient. Literature supplied.   2) Diabetic complications:   Eye: Does have known diabetic retinopathy.   Neuro/ Feet: Does not have known diabetic peripheral neuropathy.  Renal: Patient does not have known baseline CKD. He is on an ACEI/ARB at present.  3) Lipids: Patient is on atorvastatin 20 mg daily.LDL at goal        Signed electronically by: Mack Guise, MD  [facility]  [facility] [address] [place], [place] [address] Phone: [phone] FAX: [phone]   CC: Libby Maw, [place] [address] Phone: [phone]  Fax: [phone]    Return to Endocrinology clinic as below: No future  appointments.

## 2019-06-26 NOTE — Patient Instructions (Addendum)
-   Start GlipiZide 5 mg, 1 tablet before Breakfast  - Continue Metformin 750 mg Twice daily with meals - Continue Farxiga 10 mg daily with Breakfast    Choose healthy, lower carb lower calorie snacks: toss salad, cooked vegetables, cottage cheese, peanut butter, low fat cheese / string cheese, lower sodium deli meat, tuna salad or chicken salad     HOW TO TREAT LOW BLOOD SUGARS (Blood sugar LESS THAN 70 MG/DL)  Please follow the RULE OF 15 for the treatment of hypoglycemia treatment (when your (blood sugars are less than 70 mg/dL)    STEP 1: Take 15 grams of carbohydrates when your blood sugar is low, which includes:   3-4 GLUCOSE TABS  OR  3-4 OZ OF JUICE OR REGULAR SODA OR  ONE TUBE OF GLUCOSE GEL     STEP 2: RECHECK blood sugar in 15 MINUTES STEP 3: If your blood sugar is still low at the 15 minute recheck --> then, go back to STEP 1 and treat AGAIN with another 15 grams of carbohydrates.

## 2019-08-11 ENCOUNTER — Other Ambulatory Visit: Payer: Self-pay | Admitting: Family Medicine

## 2019-08-11 DIAGNOSIS — E1139 Type 2 diabetes mellitus with other diabetic ophthalmic complication: Secondary | ICD-10-CM

## 2019-08-11 DIAGNOSIS — F329 Major depressive disorder, single episode, unspecified: Secondary | ICD-10-CM

## 2019-08-11 DIAGNOSIS — E119 Type 2 diabetes mellitus without complications: Secondary | ICD-10-CM

## 2019-08-11 DIAGNOSIS — F419 Anxiety disorder, unspecified: Secondary | ICD-10-CM

## 2019-08-11 DIAGNOSIS — R030 Elevated blood-pressure reading, without diagnosis of hypertension: Secondary | ICD-10-CM

## 2019-08-11 NOTE — Telephone Encounter (Signed)
Refill request for pending medications. Losartan was discontinued back in January 2021 not sure if patient should be on medication or not. Please advise. Last OV 04/18/19

## 2019-08-28 MED FILL — DORZOLAMIDE HCL-TIMOLOL MAL: 22.3-6.8 | 90 days supply | Qty: 10 | Fill #0

## 2019-09-03 MED FILL — LATANOPROST 0.005% OPTH SOL: 0.005 | 150 days supply | Qty: 8 | Fill #0

## 2019-10-01 ENCOUNTER — Ambulatory Visit (INDEPENDENT_AMBULATORY_CARE_PROVIDER_SITE_OTHER): Payer: Self-pay | Admitting: Internal Medicine

## 2019-10-01 ENCOUNTER — Other Ambulatory Visit: Payer: Self-pay | Admitting: Internal Medicine

## 2019-10-01 ENCOUNTER — Other Ambulatory Visit: Payer: Self-pay

## 2019-10-01 ENCOUNTER — Encounter: Payer: Self-pay | Admitting: Internal Medicine

## 2019-10-01 VITALS — BP 124/78 | HR 78 | Ht 72.0 in | Wt 178.4 lb

## 2019-10-01 DIAGNOSIS — E1139 Type 2 diabetes mellitus with other diabetic ophthalmic complication: Secondary | ICD-10-CM

## 2019-10-01 DIAGNOSIS — Z794 Long term (current) use of insulin: Secondary | ICD-10-CM

## 2019-10-01 DIAGNOSIS — E1165 Type 2 diabetes mellitus with hyperglycemia: Secondary | ICD-10-CM

## 2019-10-01 LAB — POCT GLYCOSYLATED HEMOGLOBIN (HGB A1C): Hemoglobin A1C: 6.5 % — AB (ref 4.0–5.6)

## 2019-10-01 MED ORDER — GLIPIZIDE 5 MG PO TABS: 5.0000 mg | ORAL_TABLET | Freq: Every day | ORAL | 3 refills | Status: DC

## 2019-10-01 MED ORDER — METFORMIN HCL ER 750 MG PO TB24: 750.0000 mg | ORAL_TABLET | Freq: Two times a day (BID) | ORAL | 3 refills | Status: DC

## 2019-10-01 NOTE — Patient Instructions (Signed)
-   Continue GlipiZide 5 mg, 1 tablet before Breakfast  - Continue Metformin 750 mg Twice daily with meals - Continue Farxiga 10 mg daily with Breakfast     HOW TO TREAT LOW BLOOD SUGARS (Blood sugar LESS THAN 70 MG/DL) Please follow the RULE OF 15 for the treatment of hypoglycemia treatment (when your (blood sugars are less than 70 mg/dL)   STEP 1: Take 15 grams of carbohydrates when your blood sugar is low, which includes:  3-4 GLUCOSE TABS  OR 3-4 OZ OF JUICE OR REGULAR SODA OR ONE TUBE OF GLUCOSE GEL    STEP 2: RECHECK blood sugar in 15 MINUTES STEP 3: If your blood sugar is still low at the 15 minute recheck --> then, go back to STEP 1 and treat AGAIN with another 15 grams of carbohydrates.  

## 2019-10-01 NOTE — Progress Notes (Signed)
Name: Robert Bowen  Age/ Sex: 64 y.o., male   MRN/ DOB: [id number], [birth year]     PCP: Robert Maw, MD   Reason for Endocrinology Evaluation: Type 2 Diabetes Mellitus  Initial Endocrine Consultative Visit: 06/26/2019    PATIENT IDENTIFIER: Robert Bowen is a 64 y.o. male with a past medical history of T2DM, HTN and Congenital right eye blindness. The patient has followed with Endocrinology clinic since 06/26/2019 for consultative assistance with management of his diabetes.  DIABETIC HISTORY:  Robert Bowen was diagnosed with T2DM in 1988. He was on glyburide in the past. He has not bee on insulin  His hemoglobin A1c has ranged from 6.6% in 2018, peaking at 11.0%in 2020.   On his initial visit to our clinic , his A1c was 7.4% , he was on metformin and Farxiga ( through pt assistance program), we added glipizide  SUBJECTIVE:   During the last visit (06/26/2019): A1c 7.4%. Started glipizide, continued metformin and farxiga   Today (10/01/2019): Robert Bowen is here for a follow up on diabetes management.  He checks his blood sugars occasionally. The patient has not  had hypoglycemic episodes since the last clinic visit.  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 He has never used smokeless tobacco. He reports current alcohol use. He reports that he does not use drugs. Family History:  Family History  Problem Relation Age of Onset   Diabetes type II Father    Heart disease Father    Liver cancer Mother      HOME MEDICATIONS: Allergies as of 10/01/2019   No Known Allergies     Medication List       Accurate as of October 01, 2019  9:34 AM. If you have any questions, ask your nurse or doctor.        Accu-Chek Aviva device by Other route. Use as instructed once daily   atorvastatin 20 MG tablet Commonly known as: LIPITOR Take 1 tablet (20 mg total) by mouth daily.   brimonidine 0.2 % ophthalmic solution Commonly known as: ALPHAGAN Place 1 drop into the left eye 3 times daily.   citalopram 40 MG tablet Commonly known as: CELEXA TAKE ONE TABLET BY MOUTH ONE TIME DAILY   dapagliflozin propanediol 10 MG Tabs tablet Commonly known as: FARXIGA Take 10 mg by mouth daily.   dorzolamide-timolol 22.3-6.8 MG/ML ophthalmic solution Commonly known as: COSOPT Place 1 drop into the left eye 2 times daily.   glipiZIDE 5 MG tablet Commonly known as:  Glucotrol Take 1 tablet (5 mg total) by mouth daily before breakfast.   hypromellose 0.3 % Gel ophthalmic ointment Commonly known as: GENTEAL   losartan 25 MG tablet Commonly known as: Cozaar Take 1 tablet (25 mg total) by mouth daily.   losartan 50 MG tablet Commonly known as: COZAAR TAKE ONE TABLET BY MOUTH ONE TIME DAILY   metFORMIN 750 MG 24 hr tablet Commonly known as: GLUCOPHAGE-XR Take 1 tablet (750 mg total) by mouth 2 (two) times daily with a meal.   multivitamin tablet Take 1 tablet by mouth daily.   neomycin-polymyxin-dexameth 0.1 % Oint Commonly known as: MAXITROL Place into the left eye  nightly.   prednisoLONE acetate 1 % ophthalmic suspension Commonly known as: PRED FORTE Place 1 drop into the left eye 4 times daily.   traZODone 50 MG tablet Commonly known as: DESYREL TAKE ONE TABLET BY MOUTH DAILY AT BEDTIME   valACYclovir 500 MG tablet Commonly known as: VALTREX Take by mouth.   VITAMIN B COMPLEX-C PO Take by mouth.        OBJECTIVE:   Vital Signs: BP 124/78 (BP Location: Right Arm, Patient Position: Sitting, Cuff Size: Normal)    Pulse 78    Ht 6' (1.829 m)    Wt 178 lb 6.4 oz (80.9 kg)    SpO2 99%    BMI 24.20 kg/m   Wt Readings from Last 3 Encounters:  10/01/19 178 lb 6.4 oz (80.9 kg)  06/26/19 178 lb 3.2 oz (80.8 kg)  04/18/19 177 lb 12.8 oz (80.6 kg)     Exam: General: Pt appears well and is in NAD  Lungs: Clear with good BS bilat with no rales, rhonchi, or wheezes  Heart: RRR with normal S1 and S2 and no gallops; no murmurs; no rub  Extremities: No pretibial edema. Plantar left foot surface is intact  Neuro: MS is good with appropriate affect, pt is alert and Ox3    DM foot exam: 06/26/2019  The skin of the feet is intact without sores or ulcerations. The pedal pulses are 2+ on right and 2+ on left. The sensation is intact to a screening 5.07, 10 gram monofilament bilaterally   DATA REVIEWED:  Lab Results  Component Value Date   HGBA1C 6.5 (A) 10/01/2019   HGBA1C 7.4 (H) 04/18/2019   HGBA1C 11.0 (H) 05/10/2018   Lab Results  Component Value Date   MICROALBUR <0.7 04/18/2019   LDLCALC 64 04/18/2019   CREATININE 0.95 04/18/2019   Lab Results  Component Value Date   MICRALBCREAT 1.4 04/18/2019     Lab Results  Component Value Date   CHOL 117 04/18/2019   HDL 36.30 (L) 04/18/2019   LDLCALC 64 04/18/2019   LDLDIRECT 97.0 05/10/2018   TRIG 86.0 04/18/2019   CHOLHDL 3 04/18/2019         ASSESSMENT / PLAN / RECOMMENDATIONS:   1) Type 2 Diabetes Mellitus, Optimally controlled, With retinopathic complications - Most  recent A1c of 6.5%. Goal A1c <7.0%.      - His A1c is down from 7.4% . I have congratulated him on optimizing glucose control - No changes will be made today  - Abnormal sensation at the plantar surfaces of feet is most likely neurological   MEDICATIONS: - Continue GlipiZide 5 mg, 1 tablet before Breakfast  - Continue Metformin 750 mg Twice daily with meals - Continue Farxiga 10 mg daily with Breakfast     EDUCATION / INSTRUCTIONS:  BG monitoring instructions: Patient is  instructed to check his blood sugars 1 times a day, fasting   Call [facility] if: BG persistently < 70 .  I reviewed the Rule of 15 for the treatment of hypoglycemia in detail with the patient. Literature supplied.   2) Diabetic complications:   Eye: Does have known diabetic retinopathy.   Neuro/ Feet: Does not have known diabetic peripheral neuropathy .   Renal: Patient does not have known baseline CKD. He   is on an ACEI/ARB at present.      F/U in 6 months    Signed electronically by: Mack Guise, MD  [facility]  [facility] [address] [place], [place] [address] Phone: [phone] FAX: [phone]   CC: Robert Bowen, [place] [address] Phone: [phone]  Fax: [phone]  Return to Endocrinology clinic as below: No future appointments.

## 2019-10-07 ENCOUNTER — Other Ambulatory Visit (HOSPITAL_COMMUNITY): Payer: Self-pay | Admitting: Ophthalmology

## 2019-11-08 ENCOUNTER — Other Ambulatory Visit: Payer: Self-pay | Admitting: Family Medicine

## 2019-11-08 DIAGNOSIS — E119 Type 2 diabetes mellitus without complications: Secondary | ICD-10-CM

## 2019-11-08 DIAGNOSIS — Z794 Long term (current) use of insulin: Secondary | ICD-10-CM

## 2019-11-08 DIAGNOSIS — R030 Elevated blood-pressure reading, without diagnosis of hypertension: Secondary | ICD-10-CM

## 2019-11-20 MED FILL — BRIMONIDINE TARTRATE 0.2 %: 0.2 | 66 days supply | Qty: 10 | Fill #0

## 2019-12-05 ENCOUNTER — Encounter: Payer: Self-pay | Admitting: Family Medicine

## 2019-12-05 ENCOUNTER — Other Ambulatory Visit: Payer: Self-pay

## 2019-12-05 ENCOUNTER — Other Ambulatory Visit: Payer: Self-pay | Admitting: Family Medicine

## 2019-12-05 DIAGNOSIS — E1165 Type 2 diabetes mellitus with hyperglycemia: Secondary | ICD-10-CM

## 2019-12-05 MED ORDER — LOSARTAN POTASSIUM 25 MG PO TABS: 25.0000 mg | ORAL_TABLET | Freq: Every day | ORAL | 1 refills | Status: DC

## 2019-12-05 MED FILL — LOSARTAN POTASSIUM 25 MG TA: 25 | 90 days supply | Qty: 90 | Fill #0

## 2019-12-11 ENCOUNTER — Encounter: Payer: Self-pay | Admitting: Family Medicine

## 2019-12-11 ENCOUNTER — Telehealth: Payer: Self-pay | Admitting: Family Medicine

## 2019-12-11 DIAGNOSIS — G4700 Insomnia, unspecified: Secondary | ICD-10-CM

## 2019-12-11 DIAGNOSIS — F32A Depression, unspecified: Secondary | ICD-10-CM

## 2019-12-11 NOTE — Telephone Encounter (Signed)
Patient called back to give more details regarding appointment and per patient he has heart flutters at times when he is sitting. Patient stated that he is not having any other symptoms. Transferred call to the nurse triage line for further evaluation.

## 2019-12-11 NOTE — Telephone Encounter (Signed)
Patient scheduled an appointment via MyChart for general check up and some uneasiness in chest. Called patient to get additional information and transfer to the nurse triage line, but he did not answer. Left detailed message to give the office a call back for further evaluation.

## 2019-12-12 ENCOUNTER — Emergency Department (HOSPITAL_BASED_OUTPATIENT_CLINIC_OR_DEPARTMENT_OTHER)
Admission: EM | Admit: 2019-12-12 | Discharge: 2019-12-12 | Disposition: A | Discharge status: Home / Self Care | Payer: Medicare Other | Attending: Emergency Medicine | Admitting: Emergency Medicine

## 2019-12-12 ENCOUNTER — Other Ambulatory Visit: Payer: Self-pay | Admitting: Family Medicine

## 2019-12-12 ENCOUNTER — Other Ambulatory Visit: Payer: Self-pay

## 2019-12-12 ENCOUNTER — Encounter (HOSPITAL_BASED_OUTPATIENT_CLINIC_OR_DEPARTMENT_OTHER): Payer: Self-pay | Admitting: Emergency Medicine

## 2019-12-12 DIAGNOSIS — E113592 Type 2 diabetes mellitus with proliferative diabetic retinopathy without macular edema, left eye: Secondary | ICD-10-CM | POA: Insufficient documentation

## 2019-12-12 DIAGNOSIS — E1165 Type 2 diabetes mellitus with hyperglycemia: Secondary | ICD-10-CM | POA: Insufficient documentation

## 2019-12-12 DIAGNOSIS — R002 Palpitations: Secondary | ICD-10-CM | POA: Insufficient documentation

## 2019-12-12 DIAGNOSIS — Z7984 Long term (current) use of oral hypoglycemic drugs: Secondary | ICD-10-CM | POA: Insufficient documentation

## 2019-12-12 DIAGNOSIS — R001 Bradycardia, unspecified: Secondary | ICD-10-CM | POA: Diagnosis present

## 2019-12-12 DIAGNOSIS — Z79899 Other long term (current) drug therapy: Secondary | ICD-10-CM | POA: Insufficient documentation

## 2019-12-12 LAB — CBC WITH DIFFERENTIAL/PLATELET
Abs Immature Granulocytes: 0.03 10*3/uL (ref 0.00–0.07)
Basophils Absolute: 0 10*3/uL (ref 0.0–0.1)
Basophils Relative: 1 %
Eosinophils Absolute: 0.3 10*3/uL (ref 0.0–0.5)
Eosinophils Relative: 4 %
HCT: 44.7 % (ref 39.0–52.0)
Hemoglobin: 16.3 g/dL (ref 13.0–17.0)
Immature Granulocytes: 0 %
Lymphocytes Relative: 36 %
Lymphs Abs: 2.5 10*3/uL (ref 0.7–4.0)
MCH: 31.7 pg (ref 26.0–34.0)
MCHC: 36.5 g/dL — ABNORMAL HIGH (ref 30.0–36.0)
MCV: 86.8 fL (ref 80.0–100.0)
Monocytes Absolute: 0.6 10*3/uL (ref 0.1–1.0)
Monocytes Relative: 9 %
Neutro Abs: 3.5 10*3/uL (ref 1.7–7.7)
Neutrophils Relative %: 50 %
Platelets: 108 10*3/uL — ABNORMAL LOW (ref 150–400)
RBC: 5.15 MIL/uL (ref 4.22–5.81)
RDW: 13.2 % (ref 11.5–15.5)
Smear Review: NORMAL
WBC: 7 10*3/uL (ref 4.0–10.5)
nRBC: 0 % (ref 0.0–0.2)

## 2019-12-12 LAB — BASIC METABOLIC PANEL
Anion gap: 9 (ref 5–15)
BUN: 17 mg/dL (ref 8–23)
CO2: 29 mmol/L (ref 22–32)
Calcium: 9.9 mg/dL (ref 8.9–10.3)
Chloride: 99 mmol/L (ref 98–111)
Creatinine, Ser: 0.92 mg/dL (ref 0.61–1.24)
GFR calc Af Amer: >60 mL/min (ref >60)
GFR calc non Af Amer: >60 mL/min (ref >60)
Glucose, Bld: 125 mg/dL — ABNORMAL HIGH (ref 70–99)
Potassium: 4.2 mmol/L (ref 3.5–5.1)
Sodium: 137 mmol/L (ref 135–145)

## 2019-12-12 LAB — MAGNESIUM: Magnesium: 2 mg/dL (ref 1.7–2.4)

## 2019-12-12 MED ORDER — CITALOPRAM HYDROBROMIDE 40 MG PO TABS: 40.0000 mg | ORAL_TABLET | Freq: Every day | ORAL | 0 refills | Status: DC

## 2019-12-12 MED ORDER — TRAZODONE HCL 50 MG PO TABS: 50.0000 mg | ORAL_TABLET | Freq: Every day | ORAL | 1 refills | Status: DC

## 2019-12-12 MED FILL — traZODone HCL 50 MG TABS: 50 | 90 days supply | Qty: 90 | Fill #0

## 2019-12-12 MED FILL — CITALOPRAM HBR 40 MG TABLET: 40 | 90 days supply | Qty: 90 | Fill #0

## 2019-12-12 NOTE — ED Notes (Signed)
[facility] repeated per EDP orders

## 2019-12-12 NOTE — ED Triage Notes (Signed)
Pt sts " feels  Heart flutters". Presents with bradycardia. Denies chest pain. Lightheaded x 3 days. Alert and oriented.

## 2019-12-12 NOTE — ED Provider Notes (Signed)
[facility] EMERGENCY DEPARTMENT Provider Note   CSN: [id number] Arrival date & time: 12/12/19  1039     History Chief Complaint  Patient presents with  . Bradycardia    Robert Bowen is a 64 y.o. male.  HPI   Patient is a 64 year old male with a history of type 2 diabetes mellitus, glaucoma, juvenile cataract of both eyes.  Patient states about 4 days ago he started experiencing intermittent palpitations that he describes as "heart fluttering".  They are occurring hourly and last a few minutes with each episode.  He notes some associated lightheadedness with a few episodes.  No chest pain or shortness of breath.  No syncope.  Patient states he takes losartan for high blood pressure but denies any other cardiac medications.  States that he talked to his PCP earlier today who recommended that he come to the emergency department for further evaluation.      Past Medical History:  Diagnosis Date  . Diabetes mellitus, type 2 ([place]) 1988  . Glaucoma   . Juvenile cataract of both eyes     Patient Active Problem List   Diagnosis Date Noted  . Type 2 diabetes mellitus with hyperglycemia, without long-term current use of insulin ([place]) 06/26/2019  . Type 2 diabetes mellitus with proliferative retinopathy of left eye, without long-term current use of insulin ([place]) 06/26/2019  . 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 Thrombocytopenia ([place]) 04/21/2019  . Uncontrolled type 2 diabetes mellitus with hyperglycemia ([place]) 04/18/2019  . Mixed dyslipidemia 06/03/2018  . Nonintractable headache 05/10/2018  . DOE (dyspnea on exertion) 05/10/2018  . Other complicated headache syndrome 05/10/2018  . Elevated LDL cholesterol level 05/10/2018  . Feeling light headed 04/22/2018  . Sinusitis 04/15/2018  . RAD (reactive airway disease) 04/15/2018  . Corneal edema 03/04/2018  . 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 Need for influenza vaccination 01/17/2018  . Elevated BP without diagnosis  of hypertension 01/15/2018  . Depression, major, single episode, in partial remission ([place]) 01/15/2018  . Strain of lumbar region 12/10/2017  . Healthcare maintenance 10/15/2017  . Tendinopathy of left rotator cuff 03/01/2017  . Anxiety and depression 03/02/2016  . Controlled type 2 diabetes mellitus without complication, without long-term current use of insulin ([place]) 03/02/2016  . Diabetes mellitus ([place]) 01/11/2012  . Childhood cataract, bilateral 01/11/2012  . Glaucoma 01/11/2012    Past Surgical History:  Procedure Laterality Date  . EYE SURGERY         Family History  Problem Relation Age of Onset  . Diabetes type II Father   . Heart disease Father   . Liver cancer Mother     Social History   Tobacco Use  . Smoking status: Never Smoker  . Smokeless tobacco: Never Used  Substance Use Topics  . Alcohol use: Yes    Comment: just on occassion, rarely  . Drug use: No    Home Medications Prior to Admission medications   Medication Sig Start Date End Date Taking? Authorizing Provider  atorvastatin (LIPITOR) 20 MG tablet Take 1 tablet (20 mg total) by mouth daily. 04/18/19   Libby Maw, MD  Blood Glucose Monitoring Suppl (ACCU-CHEK AVIVA) device by Other route. Use as instructed once daily    [provider]  brimonidine (ALPHAGAN) 0.2 % ophthalmic solution Place 1 drop into the left eye 3 times daily. 06/03/19   [provider]  citalopram (CELEXA) 40 MG tablet Take 1 tablet (40 mg total) by mouth daily. 12/12/19  Libby Maw, MD  dapagliflozin propanediol (FARXIGA) 10 MG TABS tablet Take 10 mg by mouth daily. 04/18/19   Libby Maw, MD  dorzolamide-timolol (COSOPT) 22.3-6.8 MG/ML ophthalmic solution Place 1 drop into the left eye 2 times daily. 03/25/19   [provider]  glipiZIDE (GLUCOTROL) 5 MG tablet Take 1 tablet (5 mg total) by mouth daily before breakfast. 10/01/19   Shamleffer, Melanie Crazier, MD    hypromellose (GENTEAL) 0.3 % GEL ophthalmic ointment  09/02/18   [provider]  losartan (COZAAR) 25 MG tablet Take 1 tablet (25 mg total) by mouth daily. 12/05/19   Libby Maw, MD  metFORMIN (GLUCOPHAGE-XR) 750 MG 24 hr tablet Take 1 tablet (750 mg total) by mouth 2 (two) times daily with a meal. 10/01/19   Shamleffer, Melanie Crazier, MD  Multiple Vitamin (MULTIVITAMIN) tablet Take 1 tablet by mouth daily.    [provider]  neomycin-polymyxin-dexameth (MAXITROL) 0.1 % OINT Place into the left eye nightly. Patient not taking: Reported on 10/01/2019    [provider]  prednisoLONE acetate (PRED FORTE) 1 % ophthalmic suspension Place 1 drop into the left eye 4 times daily. 03/25/19   [provider]  traZODone (DESYREL) 50 MG tablet Take 1 tablet (50 mg total) by mouth at bedtime. 12/12/19   Libby Maw, MD  valACYclovir (VALTREX) 500 MG tablet Take by mouth. 01/20/19   [provider]  VITAMIN B COMPLEX-C PO Take by mouth.    [provider]    Allergies    Patient has no known allergies.  Review of Systems   Review of Systems  All other systems reviewed and are negative. Ten systems reviewed and are negative for acute change, except as noted in the HPI.    Physical Exam Updated Vital Signs BP (!) 148/73   Pulse (!) 45   Temp 99 F (37.2 C)   Resp 18   Ht 6' (1.829 m)   Wt 79.4 kg   SpO2 100%   BMI 23.73 kg/m    Physical Exam Vitals and nursing note reviewed.  Constitutional:      General: He is not in acute distress.    Appearance: Normal appearance. He is not ill-appearing, toxic-appearing or diaphoretic.  HENT:     Head: Normocephalic and atraumatic.     Right Ear: External ear normal.     Left Ear: External ear normal.     Nose: Nose normal.     Mouth/Throat:     Mouth: Mucous membranes are moist.     Pharynx: Oropharynx is clear. No oropharyngeal exudate or posterior oropharyngeal erythema.   Cardiovascular:     Rate and Rhythm: Regular rhythm. Bradycardia present.     Pulses: Normal pulses.     Heart sounds: Normal heart sounds. No murmur heard.  No friction rub. No gallop.      Comments: Mildly bradycardic in the mid 50s on my exam.  Occasional PVCs noted on the monitor. Pulmonary:     Effort: Pulmonary effort is normal. No respiratory distress.     Breath sounds: Normal breath sounds. No stridor. No wheezing, rhonchi or rales.  Abdominal:     General: Abdomen is flat.     Tenderness: There is no abdominal tenderness.  Musculoskeletal:        General: Normal range of motion.     Cervical back: Normal range of motion and neck supple. No tenderness.  Skin:    General: Skin is warm and  dry.  Neurological:     General: No focal deficit present.     Mental Status: He is alert and oriented to person, place, and time.  Psychiatric:        Mood and Affect: Mood normal.        Behavior: Behavior normal.      ED Results / Procedures / Treatments   Labs (all labs ordered are listed, but only abnormal results are displayed) Labs Reviewed  CBC WITH DIFFERENTIAL/PLATELET - Abnormal; Notable for the following components:      Result Value   MCHC 36.5 (*)    Platelets 108 (*)    All other components within normal limits  BASIC METABOLIC PANEL - Abnormal; Notable for the following components:   Glucose, Bld 125 (*)    All other components within normal limits  MAGNESIUM   EKG EKG Interpretation  Date/Time:  Friday December 12 2019 10:49:12 EDT Ventricular Rate:  50 PR Interval:  164 QRS Duration: 96 QT Interval:  450 QTC Calculation: 410 R Axis:   72 Text Interpretation: Sinus bradycardia with Premature atrial complexes with Abberant conduction Otherwise normal ECG No significant change since last tracing Confirmed by Deno Etienne [phone]) on 12/12/2019 2:36:36 PM  Radiology No results found.  Procedures Procedures (including critical care time)  Medications  Ordered in ED Medications - No data to display  ED Course  I have reviewed the triage vital signs and the nursing notes.  Pertinent labs & imaging results that were available during my care of the patient were reviewed by me and considered in my medical decision making (see chart for details).  Clinical Course as of Dec 11 1436  Fri Dec 12, 2019  1437 Pt discussed with and evaluated by my attending physician. Will place him on the cardiac monitor and observe for 1 hr. Pt did note that he had a recent left corneal transplant.  Patient is currently on timolol drops.   [LJ]    Clinical Course User Index [LJ] Rayna Sexton, PA-C   MDM Rules/Calculators/A&P                          Pt is a 64 y.o. male that presents with a history, physical exam, and ED Clinical Course as noted above.   Patient presents today due to bradycardia and intermittent palpitations that have been occurring hourly for the past 4 days.  Denies any chest pain or shortness of breath.  Does note a few episodes of lightheadedness that have occurred but no syncope, dizziness, falls.  Basic labs were obtained which were generally reassuring.  Mild thrombocytopenia at 108 with a mild hyperglycemia at 125.  Magnesium within normal limits.  Otherwise labs within normal limits.  Patient was discussed with and evaluated by my attending physician Dr. Deno Etienne.  Patient was placed on cardiac monitor for just over an hour.  Both myself as well as the attending physician reviewed this noting occasional PVCs but otherwise no significant abnormalities.  Patient has noted multiple episodes of palpitations since arriving but again, denies any chest pain, shortness of breath, syncope.  Patient has a significant history of juvenile cataracts as well as glaucoma.  Patient has had multiple eye surgeries in the past year and a half.  Patient states he has been taking timolol drops for the past 15 months or so.  We recommended that he call  his ophthalmologist to discuss his symptoms as well as his usage  of this medication.  He is not taking any other medications that we feel could likely be causing his symptoms.  Patient also given a referral to cardiology.  Feel that he will likely need a Holter monitor.  Discussed discharge with patient and his wife and they are comfortable with that at this time.  Patient is hemodynamically stable and in NAD at the time of d/c. Evaluation does not show pathology that would require ongoing emergent intervention or inpatient treatment. I explained the diagnosis to the patient. Patient is comfortable with above plan and is stable for discharge at this time. All questions were answered prior to disposition. Strict return precautions for returning to the ED were discussed. Encouraged follow up with PCP.    An After Visit Summary was printed and given to the patient.  Patient discharged to home/self care.  Condition at discharge: Stable  Note: Portions of this report may have been transcribed using voice recognition software. Every effort was made to ensure accuracy; however, inadvertent computerized transcription errors may be present.   Final Clinical Impression(s) / ED Diagnoses Final diagnoses:  Bradycardia  Palpitations   Rx / DC Orders ED Discharge Orders    None       Rayna Sexton, PA-C 12/12/19 [place], [place], DO 12/13/19 0701

## 2019-12-12 NOTE — Discharge Instructions (Addendum)
Please call the cardiologist whose contact information is below.  This is the cardiologist that has seen you previously.  You can always return to the ER with new or worsening symptoms.  It was a pleasure to meet you and your wife.

## 2019-12-12 NOTE — ED Notes (Signed)
ED Provider at bedside. Pt sitting on side of stretcher, very alert and oriented, appears in no distress at this time. Awaiting further orders from EDP

## 2019-12-16 ENCOUNTER — Other Ambulatory Visit: Payer: Self-pay

## 2019-12-16 DIAGNOSIS — H26003 Unspecified infantile and juvenile cataract, bilateral: Secondary | ICD-10-CM | POA: Insufficient documentation

## 2019-12-17 ENCOUNTER — Ambulatory Visit: Payer: Medicare Other | Admitting: Cardiology

## 2019-12-17 ENCOUNTER — Ambulatory Visit (INDEPENDENT_AMBULATORY_CARE_PROVIDER_SITE_OTHER): Payer: Medicare Other

## 2019-12-17 ENCOUNTER — Encounter: Payer: Self-pay | Admitting: Cardiology

## 2019-12-17 ENCOUNTER — Other Ambulatory Visit: Payer: Self-pay

## 2019-12-17 VITALS — BP 143/71 | HR 60 | Ht 72.0 in | Wt 180.1 lb

## 2019-12-17 DIAGNOSIS — E088 Diabetes mellitus due to underlying condition with unspecified complications: Secondary | ICD-10-CM

## 2019-12-17 DIAGNOSIS — R002 Palpitations: Secondary | ICD-10-CM

## 2019-12-17 DIAGNOSIS — E782 Mixed hyperlipidemia: Secondary | ICD-10-CM

## 2019-12-17 HISTORY — DX: Diabetes mellitus due to underlying condition with unspecified complications: E08.8

## 2019-12-17 HISTORY — DX: Palpitations: R00.2

## 2019-12-17 NOTE — Patient Instructions (Signed)
Medication Instructions:  No medication changes. *If you need a refill on your cardiac medications before your next appointment, please call your pharmacy*   Lab Work: None ordered If you have labs (blood work) drawn today and your tests are completely normal, you will receive your results only by:  [facility] (if you have MyChart) OR  A paper copy in the mail If you have any lab test that is abnormal or we need to change your treatment, we will call you to review the results.   Testing/Procedures:  WHY IS MY DOCTOR PRESCRIBING ZIO? The Zio system is proven and trusted by physicians to detect and diagnose irregular heart rhythms -- and has been prescribed to hundreds of thousands of patients.  The FDA has cleared the Zio system to monitor for many different kinds of irregular heart rhythms. In a study, physicians were able to reach a diagnosis 90% of the time with the Zio system1.  You can wear the Zio monitor -- a small, discreet, comfortable patch -- during your normal day-to-day activity, including while you sleep, shower, and exercise, while it records every single heartbeat for analysis.  1Barrett, P., et al. Comparison of 24 Hour Holter Monitoring Versus 14 Day Novel Adhesive Patch Electrocardiographic Monitoring. [place], 2014.  ZIO VS. HOLTER MONITORING The Zio monitor can be comfortably worn for up to 14 days. Holter monitors can be worn for 24 to 48 hours, limiting the time to record any irregular heart rhythms you may have. Zio is able to capture data for the 51% of patients who have their first symptom-triggered arrhythmia after 48 hours.1  LIVE WITHOUT RESTRICTIONS The Zio ambulatory cardiac monitor is a small, unobtrusive, and water-resistant patch--you might even forget youre wearing it. The Zio monitor records and stores every beat of your heart, whether you're sleeping, working out, or showering.  Wear the monitor for 3 days, remove  12/20/19.   Follow-Up: At [facility], you and your health needs are our priority.  As part of our continuing mission to provide you with exceptional heart care, we have created designated Provider Care Teams.  These Care Teams include your primary Cardiologist (physician) and Advanced Practice Providers (APPs -  Physician Assistants and Nurse Practitioners) who all work together to provide you with the care you need, when you need it.  We recommend signing up for the patient portal called "MyChart".  Sign up information is provided on this After Visit Summary.  MyChart is used to connect with patients for Virtual Visits (Telemedicine).  Patients are able to view lab/test results, encounter notes, upcoming appointments, etc.  Non-urgent messages can be sent to your provider as well.   To learn more about what you can do with MyChart, go to [email or web address].    Your next appointment:   3 month(s)  The format for your next appointment:   In Person  Provider:   Jyl Heinz, MD   Other Instructions NA

## 2019-12-17 NOTE — Progress Notes (Signed)
Cardiology Office Note:    Date:  12/17/2019   ID:  Robert Bowen, DOB Feb 21, 1956, MRN [id number]  PCP:  Libby Maw, MD  Cardiologist:  Jenean Lindau, MD   Referring MD: Libby Maw,*    ASSESSMENT:    1. Mixed dyslipidemia   2. Diabetes mellitus due to underlying condition with unspecified complications ([place])   3. Palpitations    PLAN:    In order of problems listed above:  1. Palpitations: I reassured the patient about my findings today.  This is more of a skipped beat sensation.  His TSH this year has been unremarkable.  In view of this we will do a 3-day monitoring. 2. Mixed dyslipidemia: Diet was emphasized.  This is managed by his primary care physician. 3. Diabetes mellitus with retinopathy: Diet was emphasized.  Again this is managed by his primary care physician.  He is on statin therapy. 4. Patient will be seen in follow-up appointment in 3 months or earlier if the patient has any concerns    Medication Adjustments/Labs and Tests Ordered: Current medicines are reviewed at length with the patient today.  Concerns regarding medicines are outlined above.  No orders of the defined types were placed in this encounter.  No orders of the defined types were placed in this encounter.    No chief complaint on file.    History of Present Illness:    Robert Bowen is a 64 y.o. male.  Patient has past medical history of mixed dyslipidemia, diabetes mellitus.  He recently went to the hospital for a feeling of palpitations and skipped beat.  He was found to be bradycardic.  He denies any problems at this time.  He still feels a skipped beat sensation and some palpitations which are very mild.  He feels them on a daily basis.  No chest pain orthopnea or PND.  At the time of my evaluation, the patient is alert awake oriented and in no distress.  Past Medical History:  Diagnosis Date  . Anterior capsular phimosis of lens 06/17/2018  . Anxiety and  depression 03/02/2016  . Childhood cataract, bilateral 01/11/2012  . 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 Controlled type 2 diabetes mellitus without complication, without long-term current use of insulin ([place]) 03/02/2016  . Depression, major, single episode, in partial remission ([place]) 01/15/2018  . Diabetes mellitus ([place]) 01/11/2012  . Diabetes mellitus, type 2 ([place]) 1988  . 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 Added automatically from request for surgery [id number]  . DOE (dyspnea on exertion) 05/10/2018  . Elevated BP without diagnosis of hypertension 01/15/2018  . Elevated LDL cholesterol level 05/10/2018  . Feeling light headed 04/22/2018  . Glaucoma   . Healthcare maintenance 10/15/2017  . Juvenile cataract of both eyes   . Mixed dyslipidemia 06/03/2018  . Need for influenza vaccination 01/17/2018  . Nonintractable headache 05/10/2018  . Other complicated headache syndrome 05/10/2018  . 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 RAD (reactive airway disease) 04/15/2018  . Retinal edema 03/04/2018   Formatting of this note might be different from the original. Added automatically from request for surgery [id number]  . 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 Sinusitis 04/15/2018  . Strain of lumbar region 12/10/2017  . Tendinopathy of left rotator cuff 03/01/2017   Formatting of this note might  be different from the original. Last Assessment & Plan:  Start Meloxicam. Supportive measures reviewed. If not improving we will need further assessment.  . Thrombocytopenia ([place]) 04/21/2019  . Type 2 diabetes mellitus with hyperglycemia, without long-term current use of insulin ([place]) 06/26/2019  . 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 Uncontrolled type 2 diabetes mellitus with hyperglycemia ([place]) 04/18/2019  . Vitreous prolapse, left eye 08/12/2018   Formatting of this note might be different from the original. Added automatically from request for surgery [id number]  Formatting of this note might be different from the original. Added automatically from request for surgery [id number]    Past Surgical History:  Procedure Laterality Date  . EYE SURGERY      Current Medications: Current Meds  Medication Sig  . atorvastatin (LIPITOR) 20 MG tablet Take 1 tablet (20 mg total) by mouth daily.  . Blood Glucose Monitoring Suppl (ACCU-CHEK AVIVA) device by Other route. Use as instructed once daily  . brimonidine (ALPHAGAN) 0.2 % ophthalmic solution Place 1 drop into the left eye 3 times daily.  . citalopram (CELEXA) 40 MG tablet Take 1 tablet (40 mg total) by mouth daily.  . dapagliflozin propanediol (FARXIGA) 10 MG TABS tablet Take 10 mg by mouth daily.  . dorzolamide-timolol (COSOPT) 22.3-6.8 MG/ML ophthalmic solution Place 1 drop into the left eye 2 times daily.  . fluorometholone (EFLONE) 0.1 % ophthalmic suspension Place 1 drop into the left eye in the morning and at bedtime.  Marland Kitchen glipiZIDE (GLUCOTROL) 5 MG tablet Take 1 tablet (5 mg total) by mouth daily before breakfast.  . hypromellose (GENTEAL) 0.3 % GEL ophthalmic ointment   . latanoprost (XALATAN) 0.005 % ophthalmic solution Place 1 drop into the left eye at bedtime.  Marland Kitchen losartan (COZAAR) 25 MG tablet Take 1 tablet (25 mg total) by mouth daily.  . metFORMIN (GLUCOPHAGE-XR) 750 MG 24 hr tablet Take 1 tablet (750 mg total) by mouth 2 (two) times daily with a meal.  . moxifloxacin (VIGAMOX) 0.5 % ophthalmic solution Place 1 drop into the left eye in the morning, at noon, in the evening, and at bedtime.  . Multiple Vitamin (MULTIVITAMIN) tablet Take 1 tablet by mouth daily.  . prednisoLONE acetate (PRED FORTE) 1 % ophthalmic suspension Place 1 drop into the left eye 4 times daily.   . traZODone (DESYREL) 50 MG tablet Take 1 tablet (50 mg total) by mouth at bedtime.  . valACYclovir (VALTREX) 500 MG tablet Take by mouth.  Marland Kitchen VITAMIN B COMPLEX-C PO Take by mouth.     Allergies:   Patient has no known allergies.   Social History   Socioeconomic History  . Marital status: Married    Spouse name: Not on file  . Number of children: Not on file  . Years of education: Not on file  . Highest education level: Not on file  Occupational History  . Not on file  Tobacco Use  . Smoking status: Never Smoker  . Smokeless tobacco: Never Used  Substance and Sexual Activity  . Alcohol use: Yes    Comment: just on occassion, rarely  . Drug use: No  . Sexual activity: Yes  Other Topics Concern  . Not on file  Social History Narrative   Part time employment.   Non-denominational pastor.   Social Determinants of Health   Financial Resource Strain:   . Difficulty of Paying Living Expenses: Not on file  Food Insecurity:   . Worried About Charity fundraiser in the Last Year: Not on file  . Ran Out of Food in the Last Year: Not on file  Transportation Needs:   . Lack of Transportation (Medical): Not on file  . Lack of Transportation (Non-Medical): Not on file  Physical Activity:   . Days of Exercise per Week: Not on file  . Minutes of Exercise per Session: Not on file  Stress:   . Feeling of Stress : Not on file  Social Connections:   . Frequency of Communication with Friends and Family: Not on file  . Frequency of Social Gatherings with Friends and Family: Not on file  . Attends Religious Services: Not on file  . Active Member of Clubs or Organizations: Not on file  . Attends Archivist Meetings: Not on file  . Marital Status: Not on file     Family History: The patient's family history includes Diabetes type II in his father; Heart disease in his father; Liver cancer in his mother.  ROS:   Please see the history of present illness.    All other  systems reviewed and are negative.  EKGs/Labs/Other Studies Reviewed:    The following studies were reviewed today: EKG done reveals sinus bradycardia nonspecific ST-T changes.  Emergency room records were reviewed also along with EKG.   Recent Labs: 04/18/2019: ALT 19 12/12/2019: BUN 17; Creatinine, Ser 0.92; Hemoglobin 16.3; Magnesium 2.0; Platelets 108; Potassium 4.2; Sodium 137  Recent Lipid Panel    Component Value Date/Time   CHOL 117 04/18/2019 0918   TRIG 86.0 04/18/2019 0918   HDL 36.30 (L) 04/18/2019 0918   CHOLHDL 3 04/18/2019 0918   VLDL 17.2 04/18/2019 0918   LDLCALC 64 04/18/2019 0918   LDLDIRECT 97.0 05/10/2018 1033    Physical Exam:    VS:  BP (!) 143/71   Pulse 60   Ht 6' (1.829 m)   Wt 180 lb 1.9 oz (81.7 kg)   SpO2 96%   BMI 24.43 kg/m     Wt Readings from Last 3 Encounters:  12/17/19 180 lb 1.9 oz (81.7 kg)  12/12/19 175 lb (79.4 kg)  10/01/19 178 lb 6.4 oz (80.9 kg)     GEN: Patient is in no acute distress HEENT: Normal NECK: No JVD; No carotid bruits LYMPHATICS: No lymphadenopathy CARDIAC: Hear sounds regular, 2/6 systolic murmur at the apex. RESPIRATORY:  Clear to auscultation without rales, wheezing or rhonchi  ABDOMEN: Soft, non-tender, non-distended MUSCULOSKELETAL:  No edema; No deformity  SKIN: Warm and dry NEUROLOGIC:  Alert and oriented x 3 PSYCHIATRIC:  Normal affect   Signed, Jenean Lindau, MD  12/17/2019 1:22 PM    [facility]

## 2019-12-23 ENCOUNTER — Ambulatory Visit: Payer: Medicare Other | Admitting: Family Medicine

## 2019-12-31 DIAGNOSIS — S0532XA Ocular laceration without prolapse or loss of intraocular tissue, left eye, initial encounter: Secondary | ICD-10-CM

## 2019-12-31 HISTORY — DX: Ocular laceration without prolapse or loss of intraocular tissue, left eye, initial encounter: S05.32XA

## 2020-01-01 MED FILL — ATROPINE 1% EYE DROPS: 1 | 50 days supply | Qty: 5 | Fill #0

## 2020-01-02 MED FILL — HYDROCODON-APAP 5-325: 5-325 | 2 days supply | Qty: 7 | Fill #0

## 2020-01-04 ENCOUNTER — Other Ambulatory Visit (HOSPITAL_COMMUNITY): Payer: Self-pay | Admitting: Physician Assistant

## 2020-01-05 MED FILL — HYDROCODON-APAP 5-325: 5-325 | 2 days supply | Qty: 7 | Fill #0

## 2020-01-08 ENCOUNTER — Telehealth: Payer: Self-pay

## 2020-01-08 NOTE — Telephone Encounter (Signed)
Message left for pt to call if a follow up appt was needed from his hospital visit.

## 2020-01-12 MED FILL — VALACYCLOVIR HCL 500 MG TAB: 500 | 30 days supply | Qty: 30 | Fill #0

## 2020-01-27 ENCOUNTER — Encounter: Payer: Self-pay | Admitting: Family Medicine

## 2020-02-02 ENCOUNTER — Other Ambulatory Visit: Payer: Self-pay

## 2020-02-02 DIAGNOSIS — E119 Type 2 diabetes mellitus without complications: Secondary | ICD-10-CM

## 2020-02-02 DIAGNOSIS — E1139 Type 2 diabetes mellitus with other diabetic ophthalmic complication: Secondary | ICD-10-CM

## 2020-02-02 DIAGNOSIS — Z794 Long term (current) use of insulin: Secondary | ICD-10-CM

## 2020-02-02 DIAGNOSIS — E1165 Type 2 diabetes mellitus with hyperglycemia: Secondary | ICD-10-CM

## 2020-02-02 MED ORDER — DAPAGLIFLOZIN PROPANEDIOL 10 MG PO TABS: 10.0000 mg | ORAL_TABLET | Freq: Every day | ORAL | 1 refills | Status: DC

## 2020-02-02 NOTE — Telephone Encounter (Signed)
Patient requesting refill on pending medication which needs to be faxed. Last refill and office visit 04/18/19. If okay with you to signed off for Dr. Ethelene Hal Rx need to be printed and signed. Please advise.

## 2020-02-03 NOTE — Telephone Encounter (Signed)
Rx sent in patient aware 

## 2020-02-04 MED FILL — VALACYCLOVIR HCL 500 MG TAB: 500 | 30 days supply | Qty: 30 | Fill #1

## 2020-02-05 ENCOUNTER — Other Ambulatory Visit: Payer: Self-pay

## 2020-02-05 ENCOUNTER — Encounter: Payer: Self-pay | Admitting: Family Medicine

## 2020-02-05 DIAGNOSIS — E1165 Type 2 diabetes mellitus with hyperglycemia: Secondary | ICD-10-CM

## 2020-02-05 DIAGNOSIS — Z794 Long term (current) use of insulin: Secondary | ICD-10-CM

## 2020-02-05 DIAGNOSIS — E119 Type 2 diabetes mellitus without complications: Secondary | ICD-10-CM

## 2020-02-05 DIAGNOSIS — E1139 Type 2 diabetes mellitus with other diabetic ophthalmic complication: Secondary | ICD-10-CM

## 2020-02-05 MED ORDER — DAPAGLIFLOZIN PROPANEDIOL 10 MG PO TABS: 10.0000 mg | ORAL_TABLET | Freq: Every day | ORAL | 1 refills | Status: DC

## 2020-02-07 MED FILL — FARXIGA 10 MG TABLET: 10 | 90 days supply | Qty: 90 | Fill #0

## 2020-02-18 ENCOUNTER — Encounter: Payer: Self-pay | Admitting: Family Medicine

## 2020-02-18 DIAGNOSIS — E119 Type 2 diabetes mellitus without complications: Secondary | ICD-10-CM

## 2020-03-02 ENCOUNTER — Other Ambulatory Visit (HOSPITAL_COMMUNITY): Payer: Self-pay | Admitting: Family Medicine

## 2020-03-02 MED ORDER — ONETOUCH ULTRASOFT LANCETS MISC: 12 refills | Status: DC

## 2020-03-02 MED ORDER — GLUCOSE BLOOD VI STRP: ORAL_STRIP | 12 refills | Status: DC

## 2020-03-02 MED FILL — VALACYCLOVIR HCL 500 MG TAB: 500 | 30 days supply | Qty: 30 | Fill #2

## 2020-03-02 MED FILL — ONETOUCH DELICA PLUS LANCET: 50 days supply | Qty: 100 | Fill #0

## 2020-03-02 MED FILL — ONETOUCH VERIO FLEX SYSTEM: W/DEVICE | 30 days supply | Qty: 1 | Fill #0

## 2020-03-02 NOTE — Addendum Note (Signed)
Addended by: Darral Dash on: 03/02/2020 01:41 PM   Modules accepted: Orders

## 2020-03-02 NOTE — Telephone Encounter (Signed)
Please see message . Thank you .

## 2020-03-03 ENCOUNTER — Telehealth: Payer: Self-pay | Admitting: Family Medicine

## 2020-03-03 NOTE — Telephone Encounter (Signed)
Sarah at [facility] called. They cannot dispense the test strips without a frequency (# of times per day to test). Please call or fax new order. Pt has the meter but no test strips.

## 2020-03-04 ENCOUNTER — Other Ambulatory Visit: Payer: Self-pay | Admitting: Family Medicine

## 2020-03-04 ENCOUNTER — Other Ambulatory Visit: Payer: Self-pay

## 2020-03-04 DIAGNOSIS — E119 Type 2 diabetes mellitus without complications: Secondary | ICD-10-CM

## 2020-03-04 MED ORDER — GLUCOSE BLOOD VI STRP: ORAL_STRIP | 12 refills | Status: DC

## 2020-03-04 MED FILL — ONE TOUCH VERIO TEST STRIP: 50 days supply | Qty: 100 | Fill #0

## 2020-03-04 NOTE — Telephone Encounter (Signed)
Rx resubmitted

## 2020-03-09 MED FILL — PREDNISOLONE AC 1% EYE DROP: 1 | 25 days supply | Qty: 5 | Fill #0

## 2020-03-09 MED FILL — traZODone HCL 50 MG TABS: 50 | 90 days supply | Qty: 90 | Fill #1

## 2020-03-11 ENCOUNTER — Other Ambulatory Visit: Payer: Self-pay

## 2020-03-12 ENCOUNTER — Ambulatory Visit: Payer: Medicare Other | Admitting: Cardiology

## 2020-03-12 MED FILL — LOSARTAN POTASSIUM 25 MG TA: 25 | 90 days supply | Qty: 90 | Fill #1

## 2020-04-01 MED FILL — BRIMONIDINE TARTRATE 0.2 %: 0.2 | 33 days supply | Qty: 5 | Fill #1

## 2020-04-06 ENCOUNTER — Ambulatory Visit: Payer: Self-pay | Admitting: Internal Medicine

## 2020-04-07 ENCOUNTER — Other Ambulatory Visit (HOSPITAL_COMMUNITY): Payer: Self-pay

## 2020-04-07 ENCOUNTER — Ambulatory Visit: Payer: Self-pay | Admitting: Internal Medicine

## 2020-04-09 MED FILL — PREDNISOLONE AC 1% EYE DROP: 1 | 25 days supply | Qty: 5 | Fill #1

## 2020-04-12 MED FILL — VALACYCLOVIR HCL 500 MG TAB: 500 | 30 days supply | Qty: 30 | Fill #0

## 2020-04-17 ENCOUNTER — Other Ambulatory Visit: Payer: Self-pay | Admitting: Family Medicine

## 2020-04-17 DIAGNOSIS — F32A Depression, unspecified: Secondary | ICD-10-CM

## 2020-04-19 ENCOUNTER — Other Ambulatory Visit: Payer: Self-pay | Admitting: Family

## 2020-04-19 MED FILL — CITALOPRAM HBR 40 MG TABLET: 40 | 90 days supply | Qty: 90 | Fill #0

## 2020-05-12 MED FILL — VALACYCLOVIR HCL 500 MG TAB: 500 | 30 days supply | Qty: 30 | Fill #1

## 2020-05-27 ENCOUNTER — Encounter: Payer: Self-pay | Admitting: Family Medicine

## 2020-05-27 ENCOUNTER — Other Ambulatory Visit: Payer: Self-pay

## 2020-05-27 ENCOUNTER — Other Ambulatory Visit: Payer: Self-pay | Admitting: Family Medicine

## 2020-05-27 DIAGNOSIS — E7800 Pure hypercholesterolemia, unspecified: Secondary | ICD-10-CM

## 2020-05-27 DIAGNOSIS — E1139 Type 2 diabetes mellitus with other diabetic ophthalmic complication: Secondary | ICD-10-CM

## 2020-05-27 DIAGNOSIS — Z794 Long term (current) use of insulin: Secondary | ICD-10-CM

## 2020-05-27 DIAGNOSIS — E119 Type 2 diabetes mellitus without complications: Secondary | ICD-10-CM

## 2020-05-27 DIAGNOSIS — E1165 Type 2 diabetes mellitus with hyperglycemia: Secondary | ICD-10-CM

## 2020-05-27 MED ORDER — ATORVASTATIN CALCIUM 20 MG PO TABS: 20.0000 mg | ORAL_TABLET | Freq: Every day | ORAL | 0 refills | Status: DC

## 2020-05-27 MED FILL — ATORVASTATIN CALCIUM 20 MG: 20 | 90 days supply | Qty: 90 | Fill #0

## 2020-05-31 MED FILL — METFORMIN HCL ER 750 MG TB2: 750 | 90 days supply | Qty: 180 | Fill #0

## 2020-06-08 ENCOUNTER — Other Ambulatory Visit: Payer: Self-pay | Admitting: Family Medicine

## 2020-06-08 DIAGNOSIS — G4700 Insomnia, unspecified: Secondary | ICD-10-CM

## 2020-06-08 MED FILL — VALACYCLOVIR HCL 500 MG TAB: 500 | 30 days supply | Qty: 30 | Fill #2

## 2020-06-09 ENCOUNTER — Other Ambulatory Visit: Payer: Self-pay | Admitting: Family Medicine

## 2020-06-09 MED FILL — traZODone HCL 50 MG TABS: 50 | 90 days supply | Qty: 90 | Fill #0

## 2020-07-03 ENCOUNTER — Other Ambulatory Visit (HOSPITAL_COMMUNITY): Payer: Self-pay

## 2020-07-03 MED FILL — Prednisolone Acetate Ophth Susp 1%: OPHTHALMIC | 25 days supply | Qty: 5 | Fill #0 | Status: AC

## 2020-07-11 ENCOUNTER — Emergency Department (HOSPITAL_COMMUNITY): Payer: Medicare Other

## 2020-07-11 ENCOUNTER — Emergency Department (HOSPITAL_COMMUNITY)
Admission: EM | Admit: 2020-07-11 | Discharge: 2020-07-11 | Disposition: A | Discharge status: Home / Self Care | Payer: Medicare Other | Attending: Emergency Medicine | Admitting: Emergency Medicine

## 2020-07-11 DIAGNOSIS — S41112A Laceration without foreign body of left upper arm, initial encounter: Secondary | ICD-10-CM | POA: Insufficient documentation

## 2020-07-11 DIAGNOSIS — E119 Type 2 diabetes mellitus without complications: Secondary | ICD-10-CM | POA: Diagnosis not present

## 2020-07-11 DIAGNOSIS — Z7984 Long term (current) use of oral hypoglycemic drugs: Secondary | ICD-10-CM | POA: Diagnosis not present

## 2020-07-11 DIAGNOSIS — Z79899 Other long term (current) drug therapy: Secondary | ICD-10-CM | POA: Diagnosis not present

## 2020-07-11 MED ORDER — OXYCODONE HCL 5 MG PO TABS: 5.0000 mg | ORAL_TABLET | Freq: Four times a day (QID) | ORAL | 0 refills | Status: DC | PRN

## 2020-07-11 MED ORDER — HYDROCODONE-ACETAMINOPHEN 5-325 MG PO TABS
1.0000 | ORAL_TABLET | Freq: Once | ORAL | Status: DC
Start: 2020-07-11 — End: 2020-07-11

## 2020-07-11 MED ORDER — AMOXICILLIN-POT CLAVULANATE 875-125 MG PO TABS: 1.0000 | ORAL_TABLET | Freq: Two times a day (BID) | ORAL | 0 refills | Status: DC

## 2020-07-11 MED ORDER — LIDOCAINE-EPINEPHRINE (PF) 2 %-1:200000 IJ SOLN
5.0000 mL | Freq: Once | INTRAMUSCULAR | Status: AC
  Administered 2020-07-11: 5 mL
  Filled 2020-07-11: qty 20

## 2020-07-11 MED ORDER — AMOXICILLIN-POT CLAVULANATE 875-125 MG PO TABS
1.0000 | ORAL_TABLET | Freq: Once | ORAL | Status: AC
  Administered 2020-07-11: 1 via ORAL
  Filled 2020-07-11: qty 1

## 2020-07-11 MED ORDER — ACETAMINOPHEN 500 MG PO TABS
1000.0000 mg | ORAL_TABLET | Freq: Once | ORAL | Status: AC
  Administered 2020-07-11: 1000 mg via ORAL
  Filled 2020-07-11: qty 2

## 2020-07-11 NOTE — ED Notes (Signed)
E-signature pad unavailable at time of pt discharge. This RN discussed discharge materials with pt and answered all pt questions. Pt stated understanding of discharge material. ? ?
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
## 2020-07-11 NOTE — Discharge Instructions (Addendum)
Please read instructions below.  Keep your wound clean and covered. In 24 hours, you can get your wound wet; gently clean it with soap and water, pat it dry, and reapply a clean bandage. You can take tylenol as needed for pain You can take the oxycodone as needed for more severe pain. Be aware this medication can make you drowsy. Follow up with orthopedics in 7 days. If you cannot be seen by them within a reasonable time, your primary care can follow up as well.  Return to the ER for fever, pus draining from wound, redness, or new or worsening symptoms.
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
## 2020-07-11 NOTE — ED Notes (Signed)
EMS IV removed by this RN
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
## 2020-07-13 ENCOUNTER — Encounter: Payer: Self-pay | Admitting: Family Medicine

## 2020-07-13 ENCOUNTER — Ambulatory Visit (INDEPENDENT_AMBULATORY_CARE_PROVIDER_SITE_OTHER): Payer: Medicare Other | Admitting: Family Medicine

## 2020-07-13 ENCOUNTER — Other Ambulatory Visit: Payer: Self-pay

## 2020-07-13 ENCOUNTER — Other Ambulatory Visit (HOSPITAL_COMMUNITY): Payer: Self-pay

## 2020-07-13 DIAGNOSIS — M25512 Pain in left shoulder: Secondary | ICD-10-CM

## 2020-07-13 MED FILL — Valacyclovir HCl Tab 500 MG: ORAL | 30 days supply | Qty: 30 | Fill #0 | Status: AC
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
## 2020-07-22 ENCOUNTER — Other Ambulatory Visit (HOSPITAL_COMMUNITY): Payer: Self-pay

## 2020-07-22 MED FILL — Prednisolone Acetate Ophth Susp 1%: OPHTHALMIC | 50 days supply | Qty: 10 | Fill #1 | Status: CN

## 2020-07-23 ENCOUNTER — Other Ambulatory Visit (HOSPITAL_COMMUNITY): Payer: Self-pay

## 2020-07-23 MED FILL — Prednisolone Acetate Ophth Susp 1%: OPHTHALMIC | 25 days supply | Qty: 5 | Fill #1 | Status: CN

## 2020-07-23 MED FILL — Prednisolone Acetate Ophth Susp 1%: OPHTHALMIC | 25 days supply | Qty: 5 | Fill #1 | Status: AC

## 2020-07-24 ENCOUNTER — Ambulatory Visit (HOSPITAL_BASED_OUTPATIENT_CLINIC_OR_DEPARTMENT_OTHER)
Admission: RE | Admit: 2020-07-24 | Discharge: 2020-07-24 | Disposition: A | Discharge status: Home / Self Care | Payer: Medicare Other | Source: Ambulatory Visit | Attending: Family Medicine | Admitting: Family Medicine

## 2020-07-24 ENCOUNTER — Other Ambulatory Visit: Payer: Self-pay

## 2020-07-24 DIAGNOSIS — M25512 Pain in left shoulder: Secondary | ICD-10-CM | POA: Diagnosis present

## 2020-07-26 ENCOUNTER — Ambulatory Visit (INDEPENDENT_AMBULATORY_CARE_PROVIDER_SITE_OTHER): Payer: Medicare Other | Admitting: Family Medicine

## 2020-07-26 ENCOUNTER — Other Ambulatory Visit: Payer: Self-pay

## 2020-07-26 ENCOUNTER — Telehealth: Payer: Self-pay | Admitting: Family Medicine

## 2020-07-26 ENCOUNTER — Encounter: Payer: Self-pay | Admitting: Family Medicine

## 2020-07-26 VITALS — BP 136/70 | HR 68 | Temp 97.6°F | Ht 72.0 in | Wt 180.6 lb

## 2020-07-26 DIAGNOSIS — M25512 Pain in left shoulder: Secondary | ICD-10-CM

## 2020-07-26 DIAGNOSIS — S41112D Laceration without foreign body of left upper arm, subsequent encounter: Secondary | ICD-10-CM | POA: Diagnosis not present

## 2020-07-26 NOTE — Telephone Encounter (Signed)
MRI shows injury to the triceps and teres major muscles, but no tendon injury.  The muscles should heal with time.  No surgery indicated based on these results.    Physical therapy would be helpful.  I'll make referral to the [facility] location.
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
## 2020-08-04 ENCOUNTER — Other Ambulatory Visit (HOSPITAL_COMMUNITY): Payer: Self-pay

## 2020-08-04 MED ORDER — BRIMONIDINE TARTRATE 0.2 % OP SOLN: OPHTHALMIC | 3 refills | Status: DC

## 2020-08-04 MED ORDER — VALACYCLOVIR HCL 500 MG PO TABS: 500.0000 mg | ORAL_TABLET | Freq: Every day | ORAL | 3 refills | Status: DC

## 2020-08-04 MED ORDER — PREDNISOLONE ACETATE 1 % OP SUSP
OPHTHALMIC | 3 refills | Status: DC
  Filled 2020-08-27: qty 5, 25d supply, fill #0
  Filled 2020-11-19: qty 5, 25d supply, fill #1

## 2020-08-04 MED ORDER — DORZOLAMIDE HCL-TIMOLOL MAL 2-0.5 % OP SOLN: OPHTHALMIC | 3 refills | Status: DC

## 2020-08-09 ENCOUNTER — Other Ambulatory Visit (HOSPITAL_COMMUNITY): Payer: Self-pay

## 2020-08-09 ENCOUNTER — Other Ambulatory Visit: Payer: Self-pay | Admitting: Family Medicine

## 2020-08-09 DIAGNOSIS — E1165 Type 2 diabetes mellitus with hyperglycemia: Secondary | ICD-10-CM

## 2020-08-09 MED ORDER — LOSARTAN POTASSIUM 25 MG PO TABS
ORAL_TABLET | Freq: Every day | ORAL | 1 refills | Status: DC
  Filled 2020-08-09: qty 90, 90d supply, fill #0

## 2020-08-09 MED FILL — Valacyclovir HCl Tab 500 MG: ORAL | 30 days supply | Qty: 30 | Fill #1 | Status: AC

## 2020-08-10 NOTE — Telephone Encounter (Signed)
I called pt and left him a vm letting him know he would need an appointment to get any further refills for Losartan.

## 2020-08-13 ENCOUNTER — Ambulatory Visit: Payer: Medicare Other | Admitting: Physical Therapy

## 2020-08-20 ENCOUNTER — Other Ambulatory Visit (HOSPITAL_COMMUNITY): Payer: Self-pay

## 2020-08-20 MED ORDER — BRIMONIDINE TARTRATE 0.2 % OP SOLN
OPHTHALMIC | 11 refills | Status: DC
  Filled 2020-08-31: qty 5, 50d supply, fill #0

## 2020-08-20 MED ORDER — DORZOLAMIDE HCL-TIMOLOL MAL 2-0.5 % OP SOLN: OPHTHALMIC | 11 refills | Status: DC

## 2020-08-27 ENCOUNTER — Other Ambulatory Visit (HOSPITAL_COMMUNITY): Payer: Self-pay

## 2020-08-27 MED FILL — Dorzolamide HCl-Timolol Maleate Ophth Soln 2-0.5%: OPHTHALMIC | 90 days supply | Qty: 10 | Fill #0 | Status: AC

## 2020-08-31 ENCOUNTER — Other Ambulatory Visit (HOSPITAL_COMMUNITY): Payer: Self-pay

## 2020-09-06 ENCOUNTER — Other Ambulatory Visit (HOSPITAL_COMMUNITY): Payer: Self-pay

## 2020-09-06 ENCOUNTER — Other Ambulatory Visit: Payer: Self-pay | Admitting: Family

## 2020-09-06 DIAGNOSIS — F32A Depression, unspecified: Secondary | ICD-10-CM

## 2020-09-06 DIAGNOSIS — F419 Anxiety disorder, unspecified: Secondary | ICD-10-CM

## 2020-09-06 MED FILL — Trazodone HCl Tab 50 MG: ORAL | 90 days supply | Qty: 90 | Fill #0 | Status: AC

## 2020-09-08 ENCOUNTER — Other Ambulatory Visit: Payer: Self-pay | Admitting: Family

## 2020-09-08 ENCOUNTER — Other Ambulatory Visit (HOSPITAL_BASED_OUTPATIENT_CLINIC_OR_DEPARTMENT_OTHER): Payer: Self-pay

## 2020-09-08 ENCOUNTER — Other Ambulatory Visit (HOSPITAL_COMMUNITY): Payer: Self-pay

## 2020-09-08 DIAGNOSIS — F419 Anxiety disorder, unspecified: Secondary | ICD-10-CM

## 2020-09-09 ENCOUNTER — Other Ambulatory Visit (HOSPITAL_COMMUNITY): Payer: Self-pay

## 2020-09-09 ENCOUNTER — Other Ambulatory Visit: Payer: Self-pay | Admitting: Family Medicine

## 2020-09-09 DIAGNOSIS — F419 Anxiety disorder, unspecified: Secondary | ICD-10-CM

## 2020-09-09 NOTE — Telephone Encounter (Signed)
Refill request for pending Rx called patient to schedule follow up visit on medications, no answer LMTCB. Please advise

## 2020-09-09 NOTE — Telephone Encounter (Signed)
Patient is scheduled for 06/13 @ 9:00.

## 2020-09-12 MED ORDER — CITALOPRAM HYDROBROMIDE 40 MG PO TABS
ORAL_TABLET | Freq: Every day | ORAL | 0 refills | Status: DC
  Filled 2020-09-12: qty 90, 90d supply, fill #0

## 2020-09-13 ENCOUNTER — Other Ambulatory Visit: Payer: Self-pay

## 2020-09-13 ENCOUNTER — Encounter: Payer: Self-pay | Admitting: Family Medicine

## 2020-09-13 ENCOUNTER — Other Ambulatory Visit (HOSPITAL_COMMUNITY): Payer: Self-pay

## 2020-09-13 ENCOUNTER — Ambulatory Visit (INDEPENDENT_AMBULATORY_CARE_PROVIDER_SITE_OTHER): Payer: Medicare Other | Admitting: Family Medicine

## 2020-09-13 VITALS — BP 122/74 | HR 86 | Temp 96.7°F | Ht 72.0 in | Wt 177.2 lb

## 2020-09-13 DIAGNOSIS — E7800 Pure hypercholesterolemia, unspecified: Secondary | ICD-10-CM | POA: Diagnosis not present

## 2020-09-13 DIAGNOSIS — Z0000 Encounter for general adult medical examination without abnormal findings: Secondary | ICD-10-CM

## 2020-09-13 DIAGNOSIS — D696 Thrombocytopenia, unspecified: Secondary | ICD-10-CM

## 2020-09-13 DIAGNOSIS — E1165 Type 2 diabetes mellitus with hyperglycemia: Secondary | ICD-10-CM

## 2020-09-13 DIAGNOSIS — F419 Anxiety disorder, unspecified: Secondary | ICD-10-CM | POA: Diagnosis not present

## 2020-09-13 DIAGNOSIS — J301 Allergic rhinitis due to pollen: Secondary | ICD-10-CM | POA: Diagnosis not present

## 2020-09-13 DIAGNOSIS — H6982 Other specified disorders of Eustachian tube, left ear: Secondary | ICD-10-CM | POA: Diagnosis not present

## 2020-09-13 DIAGNOSIS — Z125 Encounter for screening for malignant neoplasm of prostate: Secondary | ICD-10-CM | POA: Diagnosis not present

## 2020-09-13 DIAGNOSIS — F32A Depression, unspecified: Secondary | ICD-10-CM

## 2020-09-13 DIAGNOSIS — E1139 Type 2 diabetes mellitus with other diabetic ophthalmic complication: Secondary | ICD-10-CM

## 2020-09-13 DIAGNOSIS — G4700 Insomnia, unspecified: Secondary | ICD-10-CM

## 2020-09-13 LAB — CBC
HCT: 45.1 % (ref 39.0–52.0)
Hemoglobin: 16 g/dL (ref 13.0–17.0)
MCHC: 35.6 g/dL (ref 30.0–36.0)
MCV: 88.1 fl (ref 78.0–100.0)
Platelets: 97 10*3/uL — ABNORMAL LOW (ref 150.0–400.0)
RBC: 5.12 Mil/uL (ref 4.22–5.81)
RDW: 13.5 % (ref 11.5–15.5)
WBC: 4.8 10*3/uL (ref 4.0–10.5)

## 2020-09-13 LAB — COMPREHENSIVE METABOLIC PANEL
ALT: 23 U/L (ref 0–53)
AST: 20 U/L (ref 0–37)
Albumin: 4.9 g/dL (ref 3.5–5.2)
Alkaline Phosphatase: 71 U/L (ref 39–117)
BUN: 14 mg/dL (ref 6–23)
CO2: 29 mEq/L (ref 19–32)
Calcium: 9.6 mg/dL (ref 8.4–10.5)
Chloride: 101 mEq/L (ref 96–112)
Creatinine, Ser: 0.95 mg/dL (ref 0.40–1.50)
GFR: 83.82 mL/min (ref >60.00)
Glucose, Bld: 165 mg/dL — ABNORMAL HIGH (ref 70–99)
Potassium: 4.1 mEq/L (ref 3.5–5.1)
Sodium: 139 mEq/L (ref 135–145)
Total Bilirubin: 1.4 mg/dL — ABNORMAL HIGH (ref 0.2–1.2)
Total Protein: 7.6 g/dL (ref 6.0–8.3)

## 2020-09-13 LAB — LIPID PANEL
Cholesterol: 111 mg/dL (ref 0–200)
HDL: 32.5 mg/dL — ABNORMAL LOW (ref >39.00)
LDL Cholesterol: 56 mg/dL (ref 0–99)
NonHDL: 78.26
Total CHOL/HDL Ratio: 3
Triglycerides: 109 mg/dL (ref 0.0–149.0)
VLDL: 21.8 mg/dL (ref 0.0–40.0)

## 2020-09-13 LAB — PSA: PSA: 1.08 ng/mL (ref 0.10–4.00)

## 2020-09-13 MED ORDER — LOSARTAN POTASSIUM 25 MG PO TABS
ORAL_TABLET | Freq: Every day | ORAL | 1 refills | Status: DC
  Filled 2020-09-13: qty 100, fill #0
  Filled 2020-11-23: qty 90, 90d supply, fill #0
  Filled 2021-04-27: qty 90, 90d supply, fill #1

## 2020-09-13 MED ORDER — FLUTICASONE PROPIONATE 50 MCG/ACT NA SUSP
2.0000 | Freq: Every day | NASAL | 6 refills | Status: DC
  Filled 2020-09-13: qty 16, 30d supply, fill #0
  Filled 2021-01-28: qty 16, 30d supply, fill #1

## 2020-09-13 MED ORDER — TRAZODONE HCL 50 MG PO TABS
ORAL_TABLET | Freq: Every day | ORAL | 1 refills | Status: DC
  Filled 2020-09-13: qty 90, fill #0
  Filled 2020-12-03: qty 90, 90d supply, fill #0
  Filled 2021-03-04: qty 90, 90d supply, fill #1

## 2020-09-13 MED ORDER — CITALOPRAM HYDROBROMIDE 40 MG PO TABS
ORAL_TABLET | Freq: Every day | ORAL | 0 refills | Status: DC
Start: 2020-09-13 — End: 2021-02-10
  Filled 2020-09-13: qty 90, 90d supply, fill #0

## 2020-09-13 MED ORDER — MOMETASONE FUROATE 50 MCG/ACT NA SUSP
2.0000 | Freq: Every day | NASAL | 12 refills | Status: DC
  Filled 2020-09-13: qty 17, 30d supply, fill #0

## 2020-09-13 NOTE — Progress Notes (Addendum)
 Established Patient Office Visit  Subjective:  Patient ID: Robert Bowen, male    DOB: 02/12/1956  Age: 65 y.o. MRN: [id number]  CC:  Chief Complaint  Patient presents with   Follow-up    Follow up/refill on medications. Patient would like left ear checked feels clogged up.     HPI Robert Bowen presents for follow-up of anxiety depression and insomnia, health maintenance, left ear congestion, and allergy rhinitis.  Has suffered from left ear congestion for months now.  He does not think he has allergy symptoms but his wife notes that he is always sniffling and sneezing.  They have tried eardrops for this.  Citalopram continues to help with depression.  He is using trazodone for sleep.  He now has insurance.  Achieving health maintenance for him has been difficult due to his lack of insurance and he agrees to go for his next colonoscopy.  Status post 3 surgeries on OS this past year.  He was forced to retire due to lack of vision.  He is legally blind.  His wife is helping him check his feet.  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 Added automatically from request for surgery [id number]   Ruptured globe of left eye 12/31/2019   Formatting of this note might be different from the original. 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 Last Assessment & Plan:  Start Meloxicam. Supportive measures reviewed. If not improving we will need further assessment.   Thrombocytopenia (HCC) 04/21/2019   Type 2 diabetes mellitus with hyperglycemia, without long-term current use of insulin (HCC) 06/26/2019   Type 2 diabetes mellitus with proliferative retinopathy of left eye, without long-term current use of insulin (HCC) 06/26/2019   Uncontrolled type 2 diabetes mellitus with hyperglycemia (HCC) 04/18/2019   Vitreous prolapse, left eye 08/12/2018   Formatting of this note might be different from the original. Added automatically from request for surgery [id number]  Formatting of this   note might be different from the original. Added automatically from request for surgery [id number]    Past Surgical History:  Procedure Laterality Date   EYE SURGERY      Family History  Problem Relation Age of Onset   Diabetes type II Father    Heart disease Father    Liver cancer Mother      Social History   Socioeconomic History   Marital status: Married    Spouse name: Not on file   Number of children: Not on file   Years of education: Not on file   Highest education level: Not on file  Occupational History   Not on file  Tobacco Use   Smoking status: Never   Smokeless tobacco: Never  Substance and Sexual Activity   Alcohol use: Yes    Comment: just on occassion, rarely   Drug use: No   Sexual activity: Yes  Other Topics Concern   Not on file  Social History Narrative   Part time employment.   Non-denominational pastor.   Social Determinants of Health   Financial Resource Strain: Not on file  Food Insecurity: Not on file  Transportation Needs: Not on file  Physical Activity: Not on file  Stress: Not on file  Social Connections: Not on file  Intimate Partner Violence: Not on file    Outpatient Medications Prior to Visit  Medication Sig Dispense Refill   atorvastatin (LIPITOR) 20 MG tablet TAKE 1 TABLET (20 MG TOTAL) BY MOUTH DAILY. MUST MAKE APPT FOR REFILLS 90 tablet 0   Blood Glucose Monitoring Suppl (ONETOUCH VERIO FLEX SYSTEM) w/Device KIT USE AS DIRECTED TO CHECK 2 TIMES DAILY 1 kit 0   brimonidine (ALPHAGAN) 0.2 % ophthalmic solution PLACE 1 DROP INTO THE LEFT EYE THREE TIMES DAILY 15 mL 11   brimonidine (ALPHAGAN) 0.2 % ophthalmic solution Place 1 drop into the left eye 2 times daily. 15 mL 3   brimonidine (ALPHAGAN) 0.2 % ophthalmic solution Place 1 drop into the left eye 2 times daily. 15 mL 11   dapagliflozin propanediol (FARXIGA) 10 MG TABS tablet Take 1 tablet (10 mg total) by mouth daily. 90 tablet 1   dorzolamide-timolol (COSOPT) 22.3-6.8 MG/ML ophthalmic solution PLACE 1 DROP IN THE LEFT EYE 2 TIMES DAILY 30 mL 3   dorzolamide-timolol (COSOPT) 22.3-6.8 MG/ML ophthalmic solution Place 1 drop into the left eye 2 times daily. 30 mL 3   dorzolamide-timolol (COSOPT) 22.3-6.8 MG/ML ophthalmic solution Place 1 drop into the left eye 2 times  daily. 30 mL 11   fluorometholone (EFLONE) 0.1 % ophthalmic suspension Place 1 drop into the left eye in the morning and at bedtime.     glipiZIDE (GLUCOTROL) 5 MG tablet Take 1 tablet (5 mg total) by mouth daily before breakfast. 90 tablet 3   glucose blood test strip USE 1 TWICE DAILY AS DIRECTED (Patient taking differently: USE 1 TWICE DAILY AS DIRECTED) 100 strip 12   Lancets (ONETOUCH DELICA PLUS LANCET33G) MISC USE TO TEST BLOOD GLUCOSE 2 TIMES DAILY AS DIRECTED 100 each 12   Lancets (ONETOUCH ULTRASOFT) lancets Use as instructed 100 each 12   metFORMIN (GLUCOPHAGE-XR) 750 MG 24 hr tablet TAKE 1 TABLET BY MOUTH TWO TIMES DAILY WITH A MEAL 180 tablet 2   Multiple Vitamin (MULTIVITAMIN) tablet Take 1 tablet by mouth daily.     prednisoLONE acetate (PRED FORTE) 1 % ophthalmic suspension Place 1 drop into both eyes in the morning and at bedtime. 10 mL 3     valACYclovir (VALTREX) 500 MG tablet TAKE 1 TABLET BY MOUTH ONCE A DAY 90 tablet 3   VITAMIN B COMPLEX-C PO Take by mouth.     citalopram (CELEXA) 40 MG tablet TAKE 1 TABLET BY MOUTH ONCE A DAY 90 tablet 0   losartan (COZAAR) 25 MG tablet TAKE 1 TABLET BY MOUTH ONCE DAILY 100 tablet 1   traZODone (DESYREL) 50 MG tablet TAKE 1 TABLET BY MOUTH AT BEDTIME 90 tablet 1   valACYclovir (VALTREX) 500 MG tablet Take 1 tablet by mouth once a day 90 tablet 3   brimonidine (ALPHAGAN) 0.2 % ophthalmic solution Place 1 drop into the left eye 3 times daily.     hypromellose (GENTEAL) 0.3 % GEL ophthalmic ointment  (Patient not taking: Reported on 09/13/2020)     latanoprost (XALATAN) 0.005 % ophthalmic solution Place 1 drop into the left eye at bedtime. (Patient not taking: No sig reported)     No facility-administered medications prior to visit.    No Known Allergies  ROS Review of Systems  Constitutional: Negative.   HENT:  Positive for congestion, hearing loss, postnasal drip and sneezing.   Eyes:  Positive for visual disturbance.  Respiratory:  Negative.    Cardiovascular: Negative.   Gastrointestinal: Negative.   Endocrine: Negative for polyphagia and polyuria.  Genitourinary:  Negative for difficulty urinating, frequency and urgency.  Musculoskeletal:  Negative for arthralgias and myalgias.  Neurological:  Negative for weakness and numbness.     Depression screen PHQ 2/9 09/13/2020 09/13/2020 04/18/2019  Decreased Interest 1 0 0  Down, Depressed, Hopeless 0 2 0  PHQ - 2 Score 1 2 0  Altered sleeping 1 - -  Tired, decreased energy 1 - -  Change in appetite 0 - -  Feeling bad or failure about yourself  0 - -  Trouble concentrating 0 - -  Moving slowly or fidgety/restless 0 - -  Suicidal thoughts 0 - -  PHQ-9 Score 3 - -  Difficult doing work/chores Not difficult at all - -     Objective:    Physical Exam Vitals and nursing note reviewed.  Constitutional:      General: He is not in acute distress.    Appearance: Normal appearance. He is normal weight. He is not ill-appearing, toxic-appearing or diaphoretic.  HENT:     Head: Normocephalic and atraumatic.     Right Ear: Tympanic membrane, ear canal and external ear normal.     Mouth/Throat:     Mouth: Mucous membranes are moist.     Pharynx: Oropharynx is clear. No oropharyngeal exudate or posterior oropharyngeal erythema.  Eyes:     General: No scleral icterus.       Right eye: No discharge.        Left eye: No discharge.     Conjunctiva/sclera: Conjunctivae normal.  Neck:     Vascular: No carotid bruit.  Cardiovascular:     Rate and Rhythm: Normal rate and regular rhythm.     Pulses:          Dorsalis pedis pulses are 2+ on the right side and 2+ on the left side.       Posterior tibial pulses are 1+ on the right side and 1+ on the left side.  Pulmonary:     Effort: Pulmonary effort is normal.     Breath sounds: Normal breath sounds.  Abdominal:     General: Abdomen is flat. Bowel sounds are normal. There is no distension.       Palpations: Abdomen is soft.  There is no mass.     Tenderness: There is no abdominal tenderness. There is no guarding or rebound.     Hernia: No hernia is present.  Genitourinary:    Prostate: Enlarged. Not tender and no nodules present.     Rectum: Guaiac result negative. No mass, tenderness, anal fissure, external hemorrhoid or internal hemorrhoid. Normal anal tone.  Musculoskeletal:     Cervical back: No rigidity or tenderness.  Lymphadenopathy:     Cervical: No cervical adenopathy.  Skin:    General: Skin is warm and dry.  Neurological:     Mental Status: He is alert and oriented to person, place, and time.  Psychiatric:        Mood and Affect: Mood normal.        Behavior: Behavior normal.   Diabetic Foot Exam - Simple   Simple Foot Form Diabetic Foot exam was performed with the following findings: Yes 09/13/2020  9:28 AM  Visual Inspection No deformities, no ulcerations, no other skin breakdown bilaterally: Yes Sensation Testing Intact to touch and monofilament testing bilaterally: Yes Pulse Check Posterior Tibialis and Dorsalis pulse intact bilaterally: Yes Comments     Diabetic Foot Exam - Simple   Simple Foot Form Diabetic Foot exam was performed with the following findings: Yes 09/13/2020  9:28 AM  Visual Inspection No deformities, no ulcerations, no other skin breakdown bilaterally: Yes Sensation Testing Intact to touch and monofilament testing bilaterally: Yes Pulse Check Posterior Tibialis and Dorsalis pulse intact bilaterally: Yes Comments      BP 122/74   Pulse 86   Temp (!) 96.7 F (35.9 C) (Temporal)   Ht 6' (1.829 m)   Wt 177 lb 3.2 oz (80.4 kg)   SpO2 99%   BMI 24.03 kg/m  Wt Readings from Last 3 Encounters:  09/13/20 177 lb 3.2 oz (80.4 kg)  07/26/20 180 lb 9.6 oz (81.9 kg)  07/11/20 178 lb (80.7 kg)     Health Maintenance Due  Topic Date Due   HIV Screening  Never done   Hepatitis C Screening  Never done   COLONOSCOPY (Pts 45-49yrs Insurance coverage will need to  be confirmed)  Never done   Zoster Vaccines- Shingrix (1 of 2) Never done   PNA vac Low Risk Adult (1 of 2 - PCV13) Never done   HEMOGLOBIN A1C  04/01/2020    There are no preventive care reminders to display for this patient.  Lab Results  Component Value Date   TSH 1.440 06/03/2018   Lab Results  Component Value Date   WBC 4.8 09/13/2020   HGB 16.0 09/13/2020   HCT 45.1 09/13/2020   MCV 88.1 09/13/2020   PLT 97.0 Repeated and verified X2. (L) 09/13/2020   Lab Results  Component Value Date   NA 139 09/13/2020   K 4.1 09/13/2020   CO2 29 09/13/2020   GLUCOSE 165 (H) 09/13/2020   BUN 14 09/13/2020   CREATININE 0.95 09/13/2020   BILITOT 1.4 (H) 09/13/2020   ALKPHOS 71 09/13/2020   AST 20 09/13/2020   ALT 23 09/13/2020   PROT 7.6 09/13/2020   ALBUMIN 4.9 09/13/2020   CALCIUM 9.6 09/13/2020   ANIONGAP 9 12/12/2019   GFR 83.82 09/13/2020   Lab Results  Component Value Date   CHOL 111 09/13/2020   Lab Results  Component Value Date   HDL 32.50 (L) 09/13/2020   Lab Results  Component Value Date   LDLCALC 56 09/13/2020     Lab Results  Component Value Date   TRIG 109.0 09/13/2020   Lab Results  Component Value Date   CHOLHDL 3 09/13/2020   Lab Results  Component Value Date   HGBA1C 6.5 (A) 10/01/2019      Assessment & Plan:   Problem List Items Addressed This Visit       Endocrine   Diabetes mellitus (HCC)   Relevant Medications   losartan (COZAAR) 25 MG tablet   Uncontrolled type 2 diabetes mellitus with hyperglycemia (HCC)   Relevant Medications   losartan (COZAAR) 25 MG tablet     Other   Anxiety and depression - Primary   Relevant Medications   citalopram (CELEXA) 40 MG tablet   traZODone (DESYREL) 50 MG tablet   Healthcare maintenance   Relevant Orders   PSA (Completed)   Ambulatory referral to Gastroenterology   Elevated LDL cholesterol level   Relevant Orders   Comprehensive metabolic panel (Completed)   Lipid panel (Completed)    Thrombocytopenia (HCC)   Relevant Orders   Ambulatory referral to Hematology / Oncology   Other Visit Diagnoses     Allergic rhinitis due to pollen, unspecified seasonality       Relevant Medications   fluticasone (FLONASE) 50 MCG/ACT nasal spray   Other Relevant Orders   CBC (Completed)   Dysfunction of left eustachian tube       Relevant Medications   fluticasone (FLONASE) 50 MCG/ACT nasal spray   Insomnia, unspecified type       Relevant Medications   traZODone (DESYREL) 50 MG tablet       Meds ordered this encounter  Medications   DISCONTD: mometasone (NASONEX) 50 MCG/ACT nasal spray    Sig: Place 2 sprays into the nose daily.    Dispense:  17 g    Refill:  12   citalopram (CELEXA) 40 MG tablet    Sig: TAKE 1 TABLET BY MOUTH ONCE A DAY    Dispense:  90 tablet    Refill:  0   losartan (COZAAR) 25 MG tablet    Sig: TAKE 1 TABLET BY MOUTH ONCE DAILY    Dispense:  100 tablet    Refill:  1   traZODone (DESYREL) 50 MG tablet    Sig: TAKE 1 TABLET BY MOUTH AT BEDTIME    Dispense:  90 tablet    Refill:  1   fluticasone (FLONASE) 50 MCG/ACT nasal spray    Sig: Place 2 sprays into both nostrils daily.    Dispense:  16 g    Refill:  6    Follow-up: Return in about 6 months (around 03/15/2021), or if symptoms worsen or fail to improve.   Agrees to go for his first colonoscopy.  We will start Nasonex for his allergy symptoms and left ear eustachian tube dysfunction.  Warned that it would take at least 2 weeks for the medication to work for him.  Continue losartan for renal protection an.  Continue citalopram and trazodone for anxiety with depression and insomnia. William Alfred Kremer, MD 

## 2020-09-13 NOTE — Addendum Note (Signed)
Addended by: Jon Billings on: 09/13/2020 06:10 PM   Modules accepted: Orders

## 2020-09-13 NOTE — Addendum Note (Signed)
Addended by: Jon Billings on: 09/13/2020 09:59 AM   Modules accepted: Orders

## 2020-09-15 ENCOUNTER — Telehealth: Payer: Self-pay | Admitting: *Deleted

## 2020-09-15 NOTE — Telephone Encounter (Signed)
Called and lvm of upcoming appointment - mailed calendar with welcome packet

## 2020-09-22 ENCOUNTER — Other Ambulatory Visit (HOSPITAL_COMMUNITY): Payer: Self-pay

## 2020-09-22 MED FILL — Valacyclovir HCl Tab 500 MG: ORAL | 30 days supply | Qty: 30 | Fill #2 | Status: AC

## 2020-10-06 ENCOUNTER — Other Ambulatory Visit: Payer: Self-pay | Admitting: Internal Medicine

## 2020-10-06 ENCOUNTER — Other Ambulatory Visit (HOSPITAL_COMMUNITY): Payer: Self-pay

## 2020-10-06 ENCOUNTER — Other Ambulatory Visit: Payer: Self-pay | Admitting: Family Medicine

## 2020-10-06 DIAGNOSIS — E7800 Pure hypercholesterolemia, unspecified: Secondary | ICD-10-CM

## 2020-10-06 MED ORDER — ATORVASTATIN CALCIUM 20 MG PO TABS
ORAL_TABLET | ORAL | 0 refills | Status: DC
  Filled 2020-10-06: qty 90, 90d supply, fill #0

## 2020-10-07 ENCOUNTER — Other Ambulatory Visit (HOSPITAL_COMMUNITY): Payer: Self-pay

## 2020-10-07 ENCOUNTER — Other Ambulatory Visit: Payer: Self-pay | Admitting: Family Medicine

## 2020-10-16 ENCOUNTER — Other Ambulatory Visit: Payer: Self-pay | Admitting: Internal Medicine

## 2020-10-16 ENCOUNTER — Other Ambulatory Visit (HOSPITAL_COMMUNITY): Payer: Self-pay

## 2020-10-16 MED FILL — Metformin HCl Tab ER 24HR 750 MG: ORAL | 7 days supply | Qty: 14 | Fill #0 | Status: AC

## 2020-10-18 ENCOUNTER — Other Ambulatory Visit: Payer: Self-pay | Admitting: Family

## 2020-10-18 ENCOUNTER — Other Ambulatory Visit (HOSPITAL_COMMUNITY): Payer: Self-pay

## 2020-10-18 DIAGNOSIS — D696 Thrombocytopenia, unspecified: Secondary | ICD-10-CM

## 2020-10-19 ENCOUNTER — Inpatient Hospital Stay: Payer: Medicare Other | Admitting: Family

## 2020-10-19 ENCOUNTER — Inpatient Hospital Stay: Payer: Medicare Other

## 2020-10-21 ENCOUNTER — Other Ambulatory Visit: Payer: Self-pay | Admitting: Internal Medicine

## 2020-10-21 ENCOUNTER — Other Ambulatory Visit (HOSPITAL_COMMUNITY): Payer: Self-pay

## 2020-10-22 ENCOUNTER — Other Ambulatory Visit (HOSPITAL_COMMUNITY): Payer: Self-pay

## 2020-10-27 ENCOUNTER — Other Ambulatory Visit (HOSPITAL_COMMUNITY): Payer: Self-pay

## 2020-10-27 MED ORDER — VALACYCLOVIR HCL 500 MG PO TABS
500.0000 mg | ORAL_TABLET | Freq: Every day | ORAL | 3 refills | Status: DC
  Filled 2020-10-27: qty 30, 30d supply, fill #0
  Filled 2020-12-03: qty 30, 30d supply, fill #1
  Filled 2021-01-26: qty 30, 30d supply, fill #2

## 2020-10-27 MED ORDER — BRIMONIDINE TARTRATE 0.2 % OP SOLN
OPHTHALMIC | 11 refills | Status: DC
  Filled 2020-10-27 – 2020-11-19 (×2): qty 5, 50d supply, fill #0

## 2020-10-27 MED ORDER — PREDNISOLONE ACETATE 1 % OP SUSP
OPHTHALMIC | 3 refills | Status: DC
  Filled 2020-10-27: qty 5, 25d supply, fill #0

## 2020-10-27 MED ORDER — DORZOLAMIDE HCL-TIMOLOL MAL 2-0.5 % OP SOLN
OPHTHALMIC | 11 refills | Status: DC
  Filled 2020-10-27: qty 10, 100d supply, fill #0

## 2020-10-28 ENCOUNTER — Telehealth: Payer: Self-pay | Admitting: Internal Medicine

## 2020-10-28 NOTE — Telephone Encounter (Signed)
MEDICATION: metformin  PHARMACY:   [facility] Phone:  [phone]  Fax:  [phone]      HAS THE PATIENT CONTACTED [facility]?  yes  IS THIS A 90 DAY SUPPLY : yes  IS PATIENT OUT OF MEDICATION: yes  IF NOT; HOW MUCH IS LEFT: 2  LAST APPOINTMENT DATE: '@01'$ /08/2020 NEXT APPOINTMENT DATE:'@8'$ /30/2022  DO WE HAVE YOUR PERMISSION TO LEAVE A DETAILED MESSAGE?: yes  OTHER COMMENTS: pt states pharmacy told him we denied this medication, I do not see any communications for this?   **Let patient know to contact pharmacy at the end of the day to make sure medication is ready. **  ** Please notify patient to allow 48-72 hours to process**  **Encourage patient to contact the pharmacy for refills or they can request refills through [facility]**

## 2020-10-29 ENCOUNTER — Other Ambulatory Visit (HOSPITAL_COMMUNITY): Payer: Self-pay

## 2020-10-29 NOTE — Telephone Encounter (Signed)
Refused. Patient need appointment, it has been over 1 year. Last appointment 10/01/2019.

## 2020-11-16 ENCOUNTER — Other Ambulatory Visit: Payer: Self-pay | Admitting: Family

## 2020-11-16 DIAGNOSIS — D696 Thrombocytopenia, unspecified: Secondary | ICD-10-CM

## 2020-11-19 ENCOUNTER — Inpatient Hospital Stay: Payer: Medicare Other | Admitting: Family

## 2020-11-19 ENCOUNTER — Telehealth: Payer: Self-pay | Admitting: Family

## 2020-11-19 ENCOUNTER — Encounter: Payer: Self-pay | Admitting: Family

## 2020-11-19 ENCOUNTER — Inpatient Hospital Stay: Payer: Medicare Other | Attending: Hematology & Oncology

## 2020-11-19 ENCOUNTER — Other Ambulatory Visit (HOSPITAL_COMMUNITY): Payer: Self-pay

## 2020-11-19 ENCOUNTER — Other Ambulatory Visit: Payer: Self-pay

## 2020-11-19 VITALS — BP 123/60 | HR 45 | Temp 98.4°F | Resp 18 | Ht 72.0 in | Wt 180.0 lb

## 2020-11-19 DIAGNOSIS — E1136 Type 2 diabetes mellitus with diabetic cataract: Secondary | ICD-10-CM | POA: Diagnosis not present

## 2020-11-19 DIAGNOSIS — Z800 Family history of malignant neoplasm of digestive organs: Secondary | ICD-10-CM

## 2020-11-19 DIAGNOSIS — D696 Thrombocytopenia, unspecified: Secondary | ICD-10-CM

## 2020-11-19 DIAGNOSIS — D693 Immune thrombocytopenic purpura: Secondary | ICD-10-CM | POA: Diagnosis present

## 2020-11-19 LAB — CMP (CANCER CENTER ONLY)
ALT: 31 U/L (ref 0–44)
AST: 21 U/L (ref 15–41)
Albumin: 4.5 g/dL (ref 3.5–5.0)
Alkaline Phosphatase: 76 U/L (ref 38–126)
Anion gap: 7 (ref 5–15)
BUN: 15 mg/dL (ref 8–23)
CO2: 30 mmol/L (ref 22–32)
Calcium: 9.5 mg/dL (ref 8.9–10.3)
Chloride: 101 mmol/L (ref 98–111)
Creatinine: 1.06 mg/dL (ref 0.61–1.24)
GFR, Estimated: >60 mL/min (ref >60)
Glucose, Bld: 208 mg/dL — ABNORMAL HIGH (ref 70–99)
Potassium: 4.4 mmol/L (ref 3.5–5.1)
Sodium: 138 mmol/L (ref 135–145)
Total Bilirubin: 1.1 mg/dL (ref 0.3–1.2)
Total Protein: 7.5 g/dL (ref 6.5–8.1)

## 2020-11-19 LAB — CBC WITH DIFFERENTIAL (CANCER CENTER ONLY)
Abs Immature Granulocytes: 0.02 10*3/uL (ref 0.00–0.07)
Basophils Absolute: 0 10*3/uL (ref 0.0–0.1)
Basophils Relative: 0 %
Eosinophils Absolute: 0.2 10*3/uL (ref 0.0–0.5)
Eosinophils Relative: 5 %
HCT: 42.4 % (ref 39.0–52.0)
Hemoglobin: 15.5 g/dL (ref 13.0–17.0)
Immature Granulocytes: 0 %
Lymphocytes Relative: 37 %
Lymphs Abs: 2 10*3/uL (ref 0.7–4.0)
MCH: 31.6 pg (ref 26.0–34.0)
MCHC: 36.6 g/dL — ABNORMAL HIGH (ref 30.0–36.0)
MCV: 86.5 fL (ref 80.0–100.0)
Monocytes Absolute: 0.5 10*3/uL (ref 0.1–1.0)
Monocytes Relative: 10 %
Neutro Abs: 2.6 10*3/uL (ref 1.7–7.7)
Neutrophils Relative %: 48 %
Platelet Count: 93 10*3/uL — ABNORMAL LOW (ref 150–400)
RBC: 4.9 MIL/uL (ref 4.22–5.81)
RDW: 13.1 % (ref 11.5–15.5)
WBC Count: 5.3 10*3/uL (ref 4.0–10.5)
nRBC: 0 % (ref 0.0–0.2)

## 2020-11-19 LAB — SAVE SMEAR(SSMR), FOR PROVIDER SLIDE REVIEW

## 2020-11-19 LAB — LACTATE DEHYDROGENASE: LDH: 159 U/L (ref 98–192)

## 2020-11-19 MED FILL — Dorzolamide HCl-Timolol Maleate Ophth Soln 2-0.5%: OPHTHALMIC | 90 days supply | Qty: 10 | Fill #1 | Status: AC

## 2020-11-19 NOTE — Progress Notes (Signed)
Hematology/Oncology Consultation   Name: NASIAH LEHENBAUER      MRN: [id number]    Location: Room/bed info not found  Date: 11/19/2020 Time:8:57 AM   REFERRING PHYSICIAN: Jon Billings, MD  REASON FOR CONSULT: Thrombocytopenia    DIAGNOSIS: Immune thrombocytopenia   HISTORY OF PRESENT ILLNESS: Mr. Jackson is a very pleasant 65 yo gentleman with a long history of thrombocytopenia. He states that he was told at the age of 26 that he has a low platelet count.  Platelets today are stable at 93, Hgb 15.5, MCV 86 and WBC count is 5.3.  No issues with blood loss. No abnormal bruising, no petechiae.  No liver or spleen issues that he is aware of. Exam today of the abdomen was negative.  No known family history of thrombocytopenia.  No adenopathy or lymphedema on exam.  No personal history of cancer.  His mother had liver cancer (smoker) and he had maternal aunts and an uncle with unknown primaries. He has never had a colonoscopy due to not previously having insurance. His PCP has referred him to GI to schedule.  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  He has the occasional headache that resolves with Tylenol.  No history of thyroid disease.  He has diabetes and is currently on Metformin, Glipizide and Faxiga followed by endocrinology.  No fever, chills, n/v, cough, rash, dizziness, SOB, chest pain, palpitations, abdominal pain or changes in bowel or bladder habits.  No swelling or tenderness in his extremities.  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  No falls or syncope to report.  He has had some neck pain and stiffness recently.  No smoking or recreational drug use.  Rare ETOH socially, wine.  He has maintained a good appetite and is staying well hydrated. His weight is described as stable.   ROS: All other 10 point review of systems is negative.   PAST MEDICAL HISTORY:   Past Medical History:   Diagnosis Date   Anterior capsular phimosis of lens 06/17/2018   Anxiety and depression 03/02/2016   Childhood cataract, bilateral 01/11/2012   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   Controlled type 2 diabetes mellitus without complication, without long-term current use of insulin ([place]) 03/02/2016   Depression, major, single episode, in partial remission ([place]) 01/15/2018   Diabetes mellitus ([place]) 01/11/2012   Diabetes mellitus due to underlying condition with unspecified complications ([place]) 10/02/6376   Diabetes mellitus, type 2 ([place]) 1988   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 Added automatically from request for surgery [id number]   Ruptured globe of left eye 12/31/2019   Formatting of this note might be different from the original. 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 Last Assessment & Plan:  Start Meloxicam. Supportive measures  reviewed. If not improving we will need further assessment.   Thrombocytopenia ([place]) 04/21/2019   Type 2 diabetes mellitus with hyperglycemia, without long-term current use of insulin ([place]) 06/26/2019   Type 2 diabetes mellitus with proliferative retinopathy of left eye, without long-term current use of insulin ([place]) 06/26/2019   Uncontrolled type 2 diabetes mellitus with hyperglycemia ([place]) 04/18/2019   Vitreous prolapse, left eye 08/12/2018   Formatting of this note might be different from the original. Added automatically from request for surgery [id number]  Formatting of this note might be different from the original. Added automatically from request for surgery [phone]    ALLERGIES: No Known Allergies    MEDICATIONS:  Current Outpatient Medications on File Prior to Visit  Medication Sig Dispense Refill   atorvastatin (LIPITOR) 20 MG tablet TAKE 1 TABLET (20 MG TOTAL) BY MOUTH DAILY. MUST MAKE APPT FOR REFILLS 90 tablet 0   Blood Glucose Monitoring Suppl (ONETOUCH VERIO FLEX SYSTEM) w/Device KIT USE AS DIRECTED TO CHECK 2 TIMES DAILY 1 kit 0   brimonidine (ALPHAGAN) 0.2 % ophthalmic solution Place 1 drop into the left eye 2 times daily. 15 mL 3   brimonidine (ALPHAGAN) 0.2 % ophthalmic solution Place 1 drop into the left eye 2 times daily. 15 mL 11   brimonidine (ALPHAGAN) 0.2 % ophthalmic solution Place 1 drop into the left eye 2 times daily. 15 mL 11   citalopram (CELEXA) 40 MG tablet TAKE 1 TABLET BY MOUTH ONCE A DAY 90 tablet 0   dapagliflozin propanediol (FARXIGA) 10 MG TABS tablet Take 1 tablet (10 mg total) by mouth daily. 90 tablet 1   dorzolamide-timolol (COSOPT) 22.3-6.8 MG/ML ophthalmic solution PLACE 1 DROP IN THE LEFT EYE 2 TIMES DAILY 30 mL 3   dorzolamide-timolol (COSOPT) 22.3-6.8 MG/ML ophthalmic solution Place 1 drop into the left eye 2 times daily. 30 mL 3   dorzolamide-timolol (COSOPT) 22.3-6.8 MG/ML ophthalmic solution Place 1 drop into the left eye 2 times daily. 30 mL 11    dorzolamide-timolol (COSOPT) 22.3-6.8 MG/ML ophthalmic solution Place 1 drop into the left eye 2 times daily. 30 mL 11   fluticasone (FLONASE) 50 MCG/ACT nasal spray Place 2 sprays into both nostrils daily. 16 g 6   glipiZIDE (GLUCOTROL) 5 MG tablet Take 1 tablet (5 mg total) by mouth daily before breakfast. 90 tablet 3   glucose blood test strip USE 1 TWICE DAILY AS DIRECTED (Patient taking differently: USE 1 TWICE DAILY AS DIRECTED) 100 strip 12   Lancets (ONETOUCH DELICA PLUS [id number]) MISC USE TO TEST BLOOD GLUCOSE 2 TIMES DAILY AS DIRECTED 100 each 12   Lancets (ONETOUCH ULTRASOFT) lancets Use as instructed 100 each 12   losartan (COZAAR) 25 MG tablet TAKE 1 TABLET BY MOUTH ONCE DAILY 100 tablet 1   metFORMIN (GLUCOPHAGE-XR) 750 MG 24 hr tablet Take 1 tablet (750 mg total) by mouth 2 (two) times daily with a meal. 14 tablet 0   Multiple Vitamin (MULTIVITAMIN) tablet Take 1 tablet by mouth daily.     prednisoLONE acetate (PRED FORTE) 1 % ophthalmic suspension Place 1 drop into both eyes in  the morning and at bedtime. 10 mL 3   prednisoLONE acetate (PRED FORTE) 1 % ophthalmic suspension Place 1 drop into both eyes in the morning and at bedtime. 10 mL 3   traZODone (DESYREL) 50 MG tablet TAKE 1 TABLET BY MOUTH AT BEDTIME 90 tablet 1   valACYclovir (VALTREX) 500 MG tablet TAKE 1 TABLET BY MOUTH ONCE A DAY 90 tablet 3   valACYclovir (VALTREX) 500 MG tablet Take 1 tablet (500 mg total) by mouth daily. 90 tablet 3   VITAMIN B COMPLEX-C PO Take by mouth.     No current facility-administered medications on file prior to visit.     PAST SURGICAL HISTORY Past Surgical History:  Procedure Laterality Date   EYE SURGERY      FAMILY HISTORY: Family History  Problem Relation Age of Onset   Diabetes type II Father    Heart disease Father    Liver cancer Mother     SOCIAL HISTORY:  reports that he has never smoked. He has never used smokeless tobacco. He reports current alcohol use. He reports  that he does not use drugs.  PERFORMANCE STATUS: The patient's performance status is 1 - Symptomatic but completely ambulatory  PHYSICAL EXAM: Most Recent Vital Signs: There were no vitals taken for this visit. BP 123/60 (BP Location: Left Arm, Patient Position: Sitting)   Pulse (!) 45   Temp 98.4 F (36.9 C) (Oral)   Resp 18   Ht 6' (1.829 m)   Wt 180 lb (81.6 kg)   SpO2 100%   BMI 24.41 kg/m   General Appearance:    Alert, cooperative, no distress, appears stated age  Head:    Normocephalic, without obvious abnormality, atraumatic  Eyes:    PERRL, conjunctiva/corneas clear, EOM's intact, fundi    benign, both eyes             Throat:   Lips, mucosa, and tongue normal; teeth and gums normal  Neck:   Supple, symmetrical, trachea midline, no adenopathy;       thyroid:  No enlargement/tenderness/nodules; no carotid   bruit or JVD  Back:     Symmetric, no curvature, ROM normal, no CVA tenderness  Lungs:     Clear to auscultation bilaterally, respirations unlabored  Chest wall:    No tenderness or deformity  Heart:    Regular rate and rhythm, S1 and S2 normal, no murmur, rub   or gallop  Abdomen:     Soft, non-tender, bowel sounds active all four quadrants,    no masses, no organomegaly        Extremities:   Extremities normal, atraumatic, no cyanosis or edema  Pulses:   2+ and symmetric all extremities  Skin:   Skin color, texture, turgor normal, no rashes or lesions  Lymph nodes:   Cervical, supraclavicular, and axillary nodes normal  Neurologic:   CNII-XII intact. Normal strength, sensation and reflexes      throughout    LABORATORY DATA:  No results found for this or any previous visit (from the past 48 hour(s)).    RADIOGRAPHY: No results found.     PATHOLOGY: None   ASSESSMENT/PLAN: Mr. Pola is a very pleasant 65 yo gentleman with a long history of thrombocytopenia since childhood.  He continues to do well and remains asymptomatic.  His counts are stable.  These were reviewed with Dr. Marin Olp along with his blood smear.  He had some larger platelets but otherwise his smear was unremarkable. 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  At this time we will release him back to follow-up with his PCP as his counts are unchanged from his baseline. He can contact our office with any questions or concerns. He will also let us know and we will see him again prior to any surgery he made need.  We can certainly see him any time he might need [place].    All questions were answered.   The patient was discussed with Dr. Marin Olp and he is in agreement with the aforementioned.   [facility]

## 2020-11-19 NOTE — Telephone Encounter (Signed)
Per 8/19 los Will follow-up PRN

## 2020-11-22 ENCOUNTER — Other Ambulatory Visit (HOSPITAL_COMMUNITY): Payer: Self-pay

## 2020-11-23 ENCOUNTER — Other Ambulatory Visit (HOSPITAL_COMMUNITY): Payer: Self-pay

## 2020-11-23 ENCOUNTER — Other Ambulatory Visit: Payer: Self-pay | Admitting: Internal Medicine

## 2020-11-23 MED ORDER — METFORMIN HCL ER 750 MG PO TB24
750.0000 mg | ORAL_TABLET | Freq: Two times a day (BID) | ORAL | 0 refills | Status: DC
  Filled 2020-11-23: qty 14, 7d supply, fill #0

## 2020-11-30 ENCOUNTER — Encounter: Payer: Self-pay | Admitting: Internal Medicine

## 2020-11-30 ENCOUNTER — Other Ambulatory Visit: Payer: Self-pay

## 2020-11-30 ENCOUNTER — Other Ambulatory Visit (HOSPITAL_COMMUNITY): Payer: Self-pay

## 2020-11-30 ENCOUNTER — Ambulatory Visit (INDEPENDENT_AMBULATORY_CARE_PROVIDER_SITE_OTHER): Payer: Medicare Other | Admitting: Internal Medicine

## 2020-11-30 VITALS — BP 126/82 | HR 66 | Ht 72.0 in | Wt 181.0 lb

## 2020-11-30 DIAGNOSIS — E1139 Type 2 diabetes mellitus with other diabetic ophthalmic complication: Secondary | ICD-10-CM

## 2020-11-30 LAB — POCT GLYCOSYLATED HEMOGLOBIN (HGB A1C): Hemoglobin A1C: 6.8 % — AB (ref 4.0–5.6)

## 2020-11-30 MED ORDER — METFORMIN HCL ER 750 MG PO TB24
750.0000 mg | ORAL_TABLET | Freq: Two times a day (BID) | ORAL | 3 refills | Status: DC
  Filled 2020-11-30: qty 180, 90d supply, fill #0
  Filled 2021-04-05: qty 180, 90d supply, fill #1

## 2020-11-30 MED ORDER — GLIPIZIDE 5 MG PO TABS
5.0000 mg | ORAL_TABLET | Freq: Every day | ORAL | 3 refills | Status: DC
  Filled 2020-11-30: qty 90, 90d supply, fill #0

## 2020-11-30 NOTE — Patient Instructions (Signed)
-   Continue GlipiZide 5 mg, 1 tablet before Breakfast  - Continue Metformin 750 mg Twice daily with meals - Continue Farxiga 10 mg daily with Breakfast     HOW TO TREAT LOW BLOOD SUGARS (Blood sugar LESS THAN 70 MG/DL) Please follow the RULE OF 15 for the treatment of hypoglycemia treatment (when your (blood sugars are less than 70 mg/dL)   STEP 1: Take 15 grams of carbohydrates when your blood sugar is low, which includes:  3-4 GLUCOSE TABS  OR 3-4 OZ OF JUICE OR REGULAR SODA OR ONE TUBE OF GLUCOSE GEL    STEP 2: RECHECK blood sugar in 15 MINUTES STEP 3: If your blood sugar is still low at the 15 minute recheck --> then, go back to STEP 1 and treat AGAIN with another 15 grams of carbohydrates.

## 2020-11-30 NOTE — Progress Notes (Signed)
Name: Robert Bowen  Age/ Sex: 65 y.o., male   MRN/ DOB: [id number], 1955-09-10     PCP: Libby Maw, MD   Reason for Endocrinology Evaluation: Type 2 Diabetes Mellitus  Initial Endocrine Consultative Visit: 06/26/2019    PATIENT IDENTIFIER: Robert Bowen is a 65 y.o. male with a past medical history of T2DM, HTN and Congenital right eye blindness. The patient has followed with Endocrinology clinic since 06/26/2019 for consultative assistance with management of his diabetes.  DIABETIC HISTORY:  Mr. Ladnier was diagnosed with T2DM in 1988. He was on glyburide in the past. He has not bee on insulin  His hemoglobin A1c has ranged from 6.6% in 2018, peaking at 11.0%in 2020.   On his initial visit to our clinic , his A1c was 7.4% , he was on metformin and Farxiga ( through pt assistance program), we added glipizide  SUBJECTIVE:   During the last visit (10/01/2019): A1c 6.5 %. Started glipizide, continued metformin and farxiga   Today (11/30/2020): Mr. Devol is here for a follow up on diabetes management.He is accompanied by his wife today.  The pt has NOT been to our clinic in 14 months.  He checks his blood sugars rarely. The patient has not  had hypoglycemic episodes since the last clinic visit.  He has been out of farxiga in 3 weeks.  Have a 68 and 32 yr old grand kids living with them    Denies nausea or vomiting    HOME DIABETES REGIMEN:  GlipiZide 5 mg, 1 tablet before Breakfast  Metformin 750 mg Twice daily with meals- ran out  [place] 10 mg daily with Breakfast      Statin: yes ACE-I/ARB: yes    METER DOWNLOAD SUMMARY: Did not bring a meter    DIABETIC COMPLICATIONS: Microvascular complications:  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 , congenital right eye blindness  Denies: neuropathy, CKD Last Eye Exam: Completed 10/2020  Macrovascular complications:   Denies: CAD, CVA, PVD   HISTORY:  Past Medical History:   Past Medical History:  Diagnosis Date   Anterior capsular phimosis of lens 06/17/2018   Anxiety and depression 03/02/2016   Childhood cataract, bilateral 01/11/2012   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   Controlled type 2 diabetes mellitus without complication, without long-term current use of insulin ([place]) 03/02/2016   Depression, major, single episode, in partial remission ([place]) 01/15/2018   Diabetes mellitus ([place]) 01/11/2012   Diabetes mellitus due to underlying condition with unspecified complications ([place]) 1/70/0174   Diabetes mellitus, type 2 ([place]) 1988   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 Added automatically from request for surgery [id number]   Ruptured globe of left eye 12/31/2019   Formatting of this note might be different from the  original. 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 Last Assessment & Plan:  Start  Meloxicam. Supportive measures reviewed. If not improving we will need further assessment.   Thrombocytopenia ([place]) 04/21/2019   Type 2 diabetes mellitus with hyperglycemia, without long-term current use of insulin ([place]) 06/26/2019   Type 2 diabetes mellitus with proliferative retinopathy of left eye, without long-term current use of insulin ([place]) 06/26/2019   Uncontrolled type 2 diabetes mellitus with hyperglycemia ([place]) 04/18/2019   Vitreous prolapse, left eye 08/12/2018   Formatting of this note might be different from the original. Added automatically from request for surgery [id number]  Formatting of this note might be different from the original. Added automatically from request for surgery [id number]   Past Surgical History:  Past Surgical History:  Procedure Laterality Date   EYE SURGERY     Social History:  reports that he has never smoked. He has never used smokeless tobacco. He reports current alcohol use. He reports that he does not use drugs. Family History:  Family History  Problem Relation Age of Onset   Diabetes type II Father    Heart disease Father    Liver cancer Mother      HOME MEDICATIONS: Allergies as of 11/30/2020   No Known Allergies      Medication List        Accurate as of November 30, 2020 10:21 AM. If you have any questions, ask your nurse or doctor.          atorvastatin 20 MG tablet Commonly known as: LIPITOR TAKE 1 TABLET (20 MG TOTAL) BY MOUTH DAILY. MUST MAKE APPT FOR REFILLS   brimonidine 0.2 % ophthalmic solution Commonly known as: ALPHAGAN Place 1 drop into the left eye 2 times daily. (Place 1 drop into the left eye 2 times daily.)   brimonidine 0.2 % ophthalmic solution Commonly known as: ALPHAGAN Place 1 drop into the left eye 2 times daily.   citalopram 40 MG tablet Commonly known as: CELEXA TAKE 1 TABLET BY MOUTH ONCE A DAY   dapagliflozin propanediol 10 MG Tabs tablet Commonly known as: FARXIGA Take 1 tablet (10 mg total) by mouth  daily.   dorzolamide-timolol 22.3-6.8 MG/ML ophthalmic solution Commonly known as: COSOPT PLACE 1 DROP IN THE LEFT EYE 2 TIMES DAILY   dorzolamide-timolol 22.3-6.8 MG/ML ophthalmic solution Commonly known as: COSOPT Place 1 drop into the left eye 2 times daily. (Place 1 drop into the left eye 2 times daily.)   dorzolamide-timolol 22.3-6.8 MG/ML ophthalmic solution Commonly known as: COSOPT Place 1 drop into the left eye 2 times daily.   dorzolamide-timolol 22.3-6.8 MG/ML ophthalmic solution Commonly known as: COSOPT Place 1 drop into the left eye 2 times daily.   fluticasone 50 MCG/ACT nasal spray Commonly known as: FLONASE Place 2 sprays into both nostrils daily.   glipiZIDE 5 MG tablet Commonly known as: Glucotrol Take 1 tablet (5 mg total) by mouth daily before breakfast.   losartan 25 MG tablet Commonly known as: COZAAR TAKE 1 TABLET BY MOUTH ONCE DAILY   metFORMIN 750 MG 24 hr tablet Commonly known as: GLUCOPHAGE-XR Take 1 tablet by mouth 2  times daily with a meal.   multivitamin  tablet Take 1 tablet by mouth daily.   onetouch ultrasoft lancets Use as instructed   OneTouch Delica Plus [id number] Misc USE TO TEST BLOOD GLUCOSE 2 TIMES DAILY AS DIRECTED   OneTouch Verio Flex System w/Device Kit USE AS DIRECTED TO CHECK 2 TIMES DAILY   OneTouch Verio test strip Generic drug: glucose blood USE 1 TWICE DAILY AS DIRECTED   prednisoLONE acetate 1 % ophthalmic suspension Commonly known as: PRED FORTE Place 1 drop into both eyes in the morning and at bedtime. (Place 1 drop into both eyes in the morning and at bedtime.)   prednisoLONE acetate 1 % ophthalmic suspension Commonly known as: PRED FORTE Place 1 drop into both eyes in the morning and at bedtime.   traZODone 50 MG tablet Commonly known as: DESYREL TAKE 1 TABLET BY MOUTH AT BEDTIME   valACYclovir 500 MG tablet Commonly known as: VALTREX TAKE 1 TABLET BY MOUTH ONCE A DAY   valACYclovir 500 MG  tablet Commonly known as: VALTREX Take 1 tablet (500 mg total) by mouth daily.   VITAMIN B COMPLEX-C PO Take by mouth.         OBJECTIVE:   Vital Signs: BP 126/82 (BP Location: Left Arm, Patient Position: Sitting, Cuff Size: Small)   Pulse 66   Ht 6' (1.829 m)   Wt 181 lb (82.1 kg)   SpO2 97%   BMI 24.55 kg/m   Wt Readings from Last 3 Encounters:  11/30/20 181 lb (82.1 kg)  11/19/20 180 lb (81.6 kg)  09/13/20 177 lb 3.2 oz (80.4 kg)     Exam: General: Pt appears well and is in NAD  Lungs: Clear with good BS bilat with no rales, rhonchi, or wheezes  Heart: RRR with normal S1 and S2 and no gallops; no murmurs; no rub  Extremities: No pretibial edema. Plantar left foot surface is intact  Neuro: MS is good with appropriate affect, pt is alert and Ox3    DM foot exam: 11/30/2020   The skin of the feet is intact without sores or ulcerations. The pedal pulses are 1+ on right and 1+ on left. The sensation is intact to a screening 5.07, 10 gram monofilament bilaterally   DATA REVIEWED:  Lab Results  Component Value Date   HGBA1C 6.5 (A) 10/01/2019   HGBA1C 7.4 (H) 04/18/2019   HGBA1C 11.0 (H) 05/10/2018   Lab Results  Component Value Date   MICROALBUR <0.7 04/18/2019   LDLCALC 56 09/13/2020   CREATININE 1.06 11/19/2020   Lab Results  Component Value Date   MICRALBCREAT 1.4 04/18/2019     Lab Results  Component Value Date   CHOL 111 09/13/2020   HDL 32.50 (L) 09/13/2020   LDLCALC 56 09/13/2020   LDLDIRECT 97.0 05/10/2018   TRIG 109.0 09/13/2020   CHOLHDL 3 09/13/2020       Results for RATHANA, VIVEROS (MRN [id number]) as of 11/30/2020 10:26  Ref. Range 11/19/2020 08:49  Sodium Latest Ref Range: 135 - 145 mmol/L 138  Potassium Latest Ref Range: 3.5 - 5.1 mmol/L 4.4  Chloride Latest Ref Range: 98 - 111 mmol/L 101  CO2 Latest Ref Range: 22 - 32 mmol/L 30  Glucose Latest Ref Range: 70 - 99 mg/dL 208 (H)  BUN Latest Ref Range: 8 - 23 mg/dL 15  Creatinine  Latest Ref Range: 0.61 - 1.24 mg/dL 1.06  Calcium Latest Ref Range: 8.9 - 10.3 mg/dL 9.5  Anion gap Latest Ref Range: 5 - 15  7  Alkaline  Phosphatase Latest Ref Range: 38 - 126 U/L 76  Albumin Latest Ref Range: 3.5 - 5.0 g/dL 4.5  AST Latest Ref Range: 15 - 41 U/L 21  ALT Latest Ref Range: 0 - 44 U/L 31  Total Protein Latest Ref Range: 6.5 - 8.1 g/dL 7.5  Total Bilirubin Latest Ref Range: 0.3 - 1.2 mg/dL 1.1  GFR, Est Non African American Latest Ref Range: >60 mL/min >60    ASSESSMENT / PLAN / RECOMMENDATIONS:   1) Type 2 Diabetes Mellitus, Optimally controlled, With retinopathic complications - Most recent A1c of 6.8%. Goal A1c <7.0%.     - A1c continues to be at goal  - Will refill medications  - NO changes today     MEDICATIONS: - Continue GlipiZide 5 mg, 1 tablet before Breakfast  - Continue Metformin 750 mg Twice daily with meals - Continue Farxiga 10 mg daily with Breakfast     EDUCATION / INSTRUCTIONS: BG monitoring instructions: Patient is instructed to check his blood sugars 1 times a day, fasting  Call [facility] if: BG persistently < 70 . I reviewed the Rule of 15 for the treatment of hypoglycemia in detail with the patient. Literature supplied.   2) Diabetic complications:  Eye: Does have known diabetic retinopathy.  Neuro/ Feet: Does not have known diabetic peripheral neuropathy .  Renal: Patient does not have known baseline CKD. He   is on an ACEI/ARB at present.      F/U in 6 months    Signed electronically by: Mack Guise, MD  [facility]  [facility] [address] [place], [place] [address] Phone: [phone] FAX: [phone]   CC: Libby Maw, [place] [address] Phone: [phone]  Fax: [phone]  Return to Endocrinology clinic as below: Future Appointments  Date Time Provider [place]  03/15/2021  8:30 AM Libby Maw, MD [id number]

## 2020-12-02 ENCOUNTER — Other Ambulatory Visit (HOSPITAL_COMMUNITY): Payer: Self-pay

## 2020-12-03 ENCOUNTER — Other Ambulatory Visit (HOSPITAL_COMMUNITY): Payer: Self-pay

## 2020-12-21 ENCOUNTER — Other Ambulatory Visit (HOSPITAL_COMMUNITY): Payer: Self-pay

## 2020-12-21 MED ORDER — PREDNISOLONE ACETATE 1 % OP SUSP
OPHTHALMIC | 11 refills | Status: AC
  Filled 2021-01-26: qty 5, 20d supply, fill #0
  Filled 2021-06-15: qty 5, 20d supply, fill #1

## 2020-12-21 MED ORDER — VALACYCLOVIR HCL 1 G PO TABS
1000.0000 mg | ORAL_TABLET | Freq: Every day | ORAL | 5 refills | Status: AC
  Filled 2020-12-21: qty 30, 30d supply, fill #0
  Filled 2021-01-26: qty 30, 30d supply, fill #1
  Filled 2021-03-04: qty 30, 30d supply, fill #2
  Filled 2021-04-05: qty 30, 30d supply, fill #3
  Filled 2021-05-14: qty 30, 30d supply, fill #4
  Filled 2021-07-12: qty 30, 30d supply, fill #5
  Filled 2021-10-19: qty 30, 30d supply, fill #6

## 2020-12-21 MED ORDER — DORZOLAMIDE HCL-TIMOLOL MAL 2-0.5 % OP SOLN
OPHTHALMIC | 11 refills | Status: DC
  Filled 2021-01-26: qty 10, 80d supply, fill #0

## 2020-12-21 MED ORDER — BRIMONIDINE TARTRATE 0.2 % OP SOLN
OPHTHALMIC | 11 refills | Status: DC
  Filled 2021-01-26: qty 5, 40d supply, fill #0
  Filled 2021-06-15: qty 5, 40d supply, fill #1

## 2020-12-22 ENCOUNTER — Other Ambulatory Visit (HOSPITAL_COMMUNITY): Payer: Self-pay

## 2021-01-19 ENCOUNTER — Other Ambulatory Visit (HOSPITAL_COMMUNITY): Payer: Self-pay

## 2021-01-21 ENCOUNTER — Other Ambulatory Visit: Payer: Self-pay

## 2021-01-21 DIAGNOSIS — E1139 Type 2 diabetes mellitus with other diabetic ophthalmic complication: Secondary | ICD-10-CM

## 2021-01-21 DIAGNOSIS — E1165 Type 2 diabetes mellitus with hyperglycemia: Secondary | ICD-10-CM

## 2021-01-21 DIAGNOSIS — E119 Type 2 diabetes mellitus without complications: Secondary | ICD-10-CM

## 2021-01-21 MED ORDER — DAPAGLIFLOZIN PROPANEDIOL 10 MG PO TABS
10.0000 mg | ORAL_TABLET | Freq: Every day | ORAL | 3 refills | Status: DC
Start: 2021-01-21 — End: 2021-05-31

## 2021-01-21 NOTE — Telephone Encounter (Signed)
Script sent to [facility]

## 2021-01-26 ENCOUNTER — Other Ambulatory Visit (HOSPITAL_COMMUNITY): Payer: Self-pay

## 2021-01-27 ENCOUNTER — Other Ambulatory Visit: Payer: Self-pay

## 2021-01-27 ENCOUNTER — Other Ambulatory Visit (HOSPITAL_COMMUNITY): Payer: Self-pay

## 2021-01-27 ENCOUNTER — Ambulatory Visit (INDEPENDENT_AMBULATORY_CARE_PROVIDER_SITE_OTHER): Payer: Medicare Other | Admitting: Family Medicine

## 2021-01-27 ENCOUNTER — Encounter: Payer: Self-pay | Admitting: Family Medicine

## 2021-01-27 VITALS — BP 122/78 | HR 55 | Temp 97.0°F | Ht 72.0 in | Wt 185.6 lb

## 2021-01-27 DIAGNOSIS — M6208 Separation of muscle (nontraumatic), other site: Secondary | ICD-10-CM

## 2021-01-27 DIAGNOSIS — R001 Bradycardia, unspecified: Secondary | ICD-10-CM

## 2021-01-27 DIAGNOSIS — Z23 Encounter for immunization: Secondary | ICD-10-CM

## 2021-01-27 DIAGNOSIS — J0100 Acute maxillary sinusitis, unspecified: Secondary | ICD-10-CM | POA: Diagnosis not present

## 2021-01-27 HISTORY — DX: Separation of muscle (nontraumatic), other site: M62.08

## 2021-01-27 HISTORY — DX: Bradycardia, unspecified: R00.1

## 2021-01-27 LAB — COMPREHENSIVE METABOLIC PANEL
ALT: 24 U/L (ref 0–53)
AST: 21 U/L (ref 0–37)
Albumin: 4.6 g/dL (ref 3.5–5.2)
Alkaline Phosphatase: 65 U/L (ref 39–117)
BUN: 11 mg/dL (ref 6–23)
CO2: 31 mEq/L (ref 19–32)
Calcium: 9.4 mg/dL (ref 8.4–10.5)
Chloride: 100 mEq/L (ref 96–112)
Creatinine, Ser: 0.96 mg/dL (ref 0.40–1.50)
GFR: 82.55 mL/min (ref >60.00)
Glucose, Bld: 204 mg/dL — ABNORMAL HIGH (ref 70–99)
Potassium: 4.2 mEq/L (ref 3.5–5.1)
Sodium: 136 mEq/L (ref 135–145)
Total Bilirubin: 1.1 mg/dL (ref 0.2–1.2)
Total Protein: 7.2 g/dL (ref 6.0–8.3)

## 2021-01-27 LAB — CBC
HCT: 40.2 % (ref 39.0–52.0)
Hemoglobin: 14.3 g/dL (ref 13.0–17.0)
MCHC: 35.6 g/dL (ref 30.0–36.0)
MCV: 88.9 fl (ref 78.0–100.0)
Platelets: 92 10*3/uL — ABNORMAL LOW (ref 150.0–400.0)
RBC: 4.51 Mil/uL (ref 4.22–5.81)
RDW: 15.1 % (ref 11.5–15.5)
WBC: 5.6 10*3/uL (ref 4.0–10.5)

## 2021-01-27 LAB — TSH: TSH: 1.42 u[IU]/mL (ref 0.35–5.50)

## 2021-01-27 MED ORDER — AMOXICILLIN-POT CLAVULANATE 875-125 MG PO TABS
1.0000 | ORAL_TABLET | Freq: Two times a day (BID) | ORAL | 0 refills | Status: DC
  Filled 2021-01-27: qty 20, 10d supply, fill #0

## 2021-01-27 NOTE — Progress Notes (Signed)
[facility] [address] [place] [address] Dept: [phone] Dept Fax: [phone]  Office Visit  Subjective:    Patient ID: Robert Bowen, male    DOB: 1955/10/27, 65 y.o..   MRN: [id number]  Chief Complaint  Patient presents with   Acute Visit    C/o feeling fatigue, HA, no energy, congestion x 1 week. He has taken OTC Ibuprofen and de-congestion med with little relief.  Also c/o not hearing out of LT ear and ? Hernia.     History of Present Illness:  Patient is in today complaining of 3-4 days of general fatigue and low energy. He states if her tries to get out to work, he can only do so for about 15 min before the fatigue causes him to stop. He has not noted any shortness of breath. He had an issue 2-3 years ago where he had some heart fluttering and had evaluation by cardiology, which was normal at the time. Mr. Robert Bowen has a history of diabetes, managed on Farxiga, glipizide and metformin. He has hypertension, managed on losartan. He has hyperlipidemia managed on atorvastatin.  Mr. Robert Bowen is also noting some generalized headache, which is different than his usual. He has pressure over his frontal and maxillary sinuses. He has had a past history of sinusitis. He does not necessarily feel congested. He also notes some decreased hearing in his left ear and some mild tenderness behind and below his left external ear.  Mr. Robert Bowen alsop notes that he has had a bulging of his mid abdomen. He notes this when he does leg lifts. He does not have tenderness in this area. He is worried this might be a hernia.  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   Thrombocytopenia ([place]) 04/21/2019   Uncontrolled type 2 diabetes mellitus with hyperglycemia ([place]) 04/18/2019   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   Vitreous prolapse, left eye 08/12/2018   Anterior capsular phimosis of lens 06/17/2018   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 associated with type 2 diabetes mellitus ([place]) 06/17/2018   Mixed dyslipidemia 06/03/2018   Nonintractable headache 05/10/2018   DOE (dyspnea on exertion) 83/15/1761   Other complicated headache syndrome 05/10/2018   Elevated LDL cholesterol level 05/10/2018   Feeling light headed 04/22/2018   Sinusitis 04/15/2018   RAD (reactive airway disease) 04/15/2018   Retinal edema 03/04/2018   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   Need for influenza vaccination 01/17/2018   Elevated BP without diagnosis of hypertension 01/15/2018   Depression, major, single episode, 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   Strain of lumbar region 12/10/2017   Healthcare maintenance 10/15/2017   Tendinopathy of left rotator cuff 03/01/2017   Anxiety and depression 03/02/2016   Controlled type 2 diabetes mellitus without complication, without long-term current use of insulin ([place]) 03/02/2016   Diabetes mellitus ([place]) 01/11/2012   Childhood cataract, bilateral 01/11/2012   Glaucoma 01/11/2012   Diabetes mellitus, type 2 ([place]) 1988  Past Surgical History:  Procedure Laterality Date   EYE SURGERY     Family History  Problem Relation Age of Onset   Diabetes type II Father    Heart disease Father    Liver cancer Mother    Outpatient Medications Prior to Visit  Medication Sig Dispense Refill   atorvastatin (LIPITOR) 20 MG tablet  TAKE 1 TABLET (20 MG TOTAL) BY MOUTH DAILY. MUST MAKE APPT FOR REFILLS 90 tablet 0   Blood Glucose Monitoring Suppl (ONETOUCH VERIO FLEX SYSTEM) w/Device KIT USE AS DIRECTED TO CHECK 2 TIMES DAILY 1 kit 0   brimonidine (ALPHAGAN) 0.2 % ophthalmic solution Place 1 drop into the left eye 2 times daily. 15 mL 11   citalopram (CELEXA) 40 MG tablet TAKE 1 TABLET BY MOUTH ONCE A DAY 90 tablet 0   dapagliflozin propanediol (FARXIGA) 10 MG TABS tablet Take 1 tablet (10 mg total) by mouth daily. 90 tablet 3   dorzolamide-timolol (COSOPT) 22.3-6.8 MG/ML ophthalmic solution PLACE 1 DROP IN THE LEFT EYE 2 TIMES DAILY 30 mL 3   dorzolamide-timolol (COSOPT) 22.3-6.8 MG/ML ophthalmic solution Place 1 drop into the left eye 2 times daily. 30 mL 3   dorzolamide-timolol (COSOPT) 22.3-6.8 MG/ML ophthalmic solution Place 1 drop into the left eye 2 times daily. 30 mL 11   fluticasone (FLONASE) 50 MCG/ACT nasal spray Place 2 sprays into both nostrils daily. 16 g 6   glipiZIDE (GLUCOTROL) 5 MG tablet Take 1 tablet (5 mg total) by mouth daily before breakfast. 90 tablet 3   glucose blood test strip USE 1 TWICE DAILY AS DIRECTED (Patient taking differently: USE 1 TWICE DAILY AS DIRECTED) 100 strip 12   Lancets (ONETOUCH DELICA PLUS [id number]) MISC USE TO TEST BLOOD GLUCOSE 2 TIMES DAILY AS DIRECTED 100 each 12   Lancets (ONETOUCH ULTRASOFT) lancets Use as instructed 100 each 12   metFORMIN (GLUCOPHAGE-XR) 750 MG 24 hr tablet Take 1 tablet by mouth 2  times daily with a meal. 180 tablet 3   Multiple Vitamin (MULTIVITAMIN) tablet Take 1 tablet by mouth daily.     prednisoLONE acetate (PRED FORTE) 1 % ophthalmic suspension Place 1 drop into both eyes in the morning and at bedtime. 15 mL 11   traZODone (DESYREL) 50 MG tablet TAKE 1 TABLET BY MOUTH AT BEDTIME 90 tablet 1   valACYclovir (VALTREX) 1000 MG tablet Take 1 tablet by mouth once daily. 90 tablet 5   VITAMIN B COMPLEX-C PO Take by mouth.     brimonidine (ALPHAGAN) 0.2  % ophthalmic solution Place 1 drop into the left eye 2 times daily. 15 mL 3   brimonidine (ALPHAGAN) 0.2 % ophthalmic solution Place 1 drop into the left eye 2 times daily. 15 mL 11   prednisoLONE acetate (PRED FORTE) 1 % ophthalmic suspension Place 1 drop into both eyes in the morning and at bedtime. 10 mL 3   valACYclovir (VALTREX) 500 MG tablet TAKE 1 TABLET BY MOUTH ONCE A DAY 90 tablet 3   losartan (COZAAR) 25 MG tablet TAKE 1 TABLET BY MOUTH ONCE DAILY 100 tablet 1   dorzolamide-timolol (COSOPT) 22.3-6.8 MG/ML ophthalmic solution Place 1 drop into the left eye 2 times daily. 30 mL 11   dorzolamide-timolol (COSOPT) 22.3-6.8 MG/ML ophthalmic solution Place 1 drop into the left eye 2 times daily. 30 mL 11   prednisoLONE acetate (PRED FORTE) 1 % ophthalmic suspension Place 1 drop into both eyes in the morning and at bedtime. 10 mL 3  valACYclovir (VALTREX) 500 MG tablet Take 1 tablet (500 mg total) by mouth daily. 90 tablet 3   No facility-administered medications prior to visit.   No Known Allergies    Objective:   Today's Vitals   01/27/21 0939  BP: 122/78  Pulse: (!) 55  Temp: (!) 97 F (36.1 C)  TempSrc: Temporal  SpO2: 98%  Weight: 185 lb 9.6 oz (84.2 kg)  Height: 6' (1.829 m)   Body mass index is 25.17 kg/m.   General: Well developed, well nourished. No acute distress. HEENT: Normocephalic, non-traumatic. External ears normal. EAC and TMs normal bilaterally.   Mild discomfort palpating around the mastoid and down to the upper SCM muscle. Nose    with moderate congestion, but no rhinorrhea. Pain on percussion over the maxillary sinuses.   Mucous membranes moist. Oropharynx clear. Good dentition. Neck: Supple. No lymphadenopathy. No thyromegaly. Lungs: Clear to auscultation bilaterally. No wheezing, rales or rhonchi. CV: RRR without murmurs or rubs. Bradycardic. Pulses 2+ bilaterally. Abdomen: Vertical bulging of the abdomen from the epigastrium to the umbilicus with  lifting of   the legs. Nontender. Psych: Alert and oriented. Normal mood and affect.  Health Maintenance Due  Topic Date Due   Pneumonia Vaccine 64+ Years old (1 - PCV) Never done   Hepatitis C Screening  Never done   COLONOSCOPY (Pts 45-44yr Insurance coverage will need to be confirmed)  Never done   Zoster Vaccines- Shingrix (1 of 2) Never done   COVID-19 Vaccine (4 - Booster for Pfizer series) 04/16/2020   INFLUENZA VACCINE  11/01/2020     EKG: Sinus bradycardia (rate= 45)  Assessment & Plan:   1. Bradycardia Mr. MValleryhas a slow heart rate. Review of prior vitals shows 2 other occasions in past year with a rate this slow. Prior to that, his pulse was consistently in the 65-85 range. I suspect his fatigue is related to the bradycardia. I will check some screening labs. I will refer him to his cardiologist for further assessment. He may need a pacemaker to manage this.  - EKG 12-Lead - TSH - Comprehensive metabolic panel - CBC - Ambulatory referral to Cardiology  2. Acute maxillary sinusitis, recurrence not specified Symptoms and exam are consistent with an acute maxillary and possibly ethmoid sinusitis. I will treat him with a course of Augmentin. He should continue his Flonase nasal spray and use of decongestants.  - amoxicillin-clavulanate (AUGMENTIN) 875-125 MG tablet; Take 1 tablet by mouth 2 (two) times daily.  Dispense: 20 tablet; Refill: 0  3. Diastasis recti I reassured him that the bulging is not a hernia. We will observe this and follow-up if any pain develops with time.  SHaydee Salter MD

## 2021-01-27 NOTE — Addendum Note (Signed)
Addended by: Armandina Gemma L on: 01/27/2021 10:50 AM   Modules accepted: Orders

## 2021-01-28 ENCOUNTER — Other Ambulatory Visit (HOSPITAL_COMMUNITY): Payer: Self-pay

## 2021-01-28 MED FILL — Dorzolamide HCl-Timolol Maleate Ophth Soln 2-0.5%: OPHTHALMIC | 90 days supply | Qty: 10 | Fill #2 | Status: AC

## 2021-02-01 ENCOUNTER — Ambulatory Visit (INDEPENDENT_AMBULATORY_CARE_PROVIDER_SITE_OTHER): Payer: Medicare Other | Admitting: *Deleted

## 2021-02-01 DIAGNOSIS — Z0000 Encounter for general adult medical examination without abnormal findings: Secondary | ICD-10-CM | POA: Diagnosis not present

## 2021-02-01 DIAGNOSIS — Z1211 Encounter for screening for malignant neoplasm of colon: Secondary | ICD-10-CM | POA: Diagnosis not present

## 2021-02-01 NOTE — Progress Notes (Signed)
Subjective:   Robert Bowen is a 65 y.o. male who presents for Medicare Annual/Subsequent preventive examination.  I connected with  Robert Bowen on 02/01/21 by a telephone enabled telemedicine application and verified that I am speaking with the correct person using two identifiers.   I discussed the limitations of evaluation and management by telemedicine. The patient expressed understanding and agreed to proceed.   Review of Systems     Cardiac Risk Factors include: diabetes mellitus;obesity (BMI >30kg/m2);male gender     Objective:    Today's Vitals   There is no height or weight on file to calculate BMI.  Advanced Directives 02/01/2021 11/19/2020 07/11/2020 12/12/2019  Does Patient Have a Medical Advance Directive? No No No No  Would patient like information on creating a medical advance directive? No - Patient declined No - Patient declined Yes (ED - Information included in AVS) -    Current Medications (verified) Outpatient Encounter Medications as of 02/01/2021  Medication Sig   atorvastatin (LIPITOR) 20 MG tablet TAKE 1 TABLET (20 MG TOTAL) BY MOUTH DAILY. MUST MAKE APPT FOR REFILLS   Blood Glucose Monitoring Suppl (ONETOUCH VERIO FLEX SYSTEM) w/Device KIT USE AS DIRECTED TO CHECK 2 TIMES DAILY   brimonidine (ALPHAGAN) 0.2 % ophthalmic solution Place 1 drop into the left eye 2 times daily.   citalopram (CELEXA) 40 MG tablet TAKE 1 TABLET BY MOUTH ONCE A DAY   dapagliflozin propanediol (FARXIGA) 10 MG TABS tablet Take 1 tablet (10 mg total) by mouth daily.   dorzolamide-timolol (COSOPT) 22.3-6.8 MG/ML ophthalmic solution PLACE 1 DROP IN THE LEFT EYE 2 TIMES DAILY   fluticasone (FLONASE) 50 MCG/ACT nasal spray Place 2 sprays into both nostrils daily.   glipiZIDE (GLUCOTROL) 5 MG tablet Take 1 tablet (5 mg total) by mouth daily before breakfast.   glucose blood test strip USE 1 TWICE DAILY AS DIRECTED (Patient taking differently: USE 1 TWICE DAILY AS DIRECTED)    Lancets (ONETOUCH DELICA PLUS CMKLKJ17H) MISC USE TO TEST BLOOD GLUCOSE 2 TIMES DAILY AS DIRECTED   Lancets (ONETOUCH ULTRASOFT) lancets Use as instructed   losartan (COZAAR) 25 MG tablet TAKE 1 TABLET BY MOUTH ONCE DAILY   metFORMIN (GLUCOPHAGE-XR) 750 MG 24 hr tablet Take 1 tablet by mouth 2  times daily with a meal.   Multiple Vitamin (MULTIVITAMIN) tablet Take 1 tablet by mouth daily.   prednisoLONE acetate (PRED FORTE) 1 % ophthalmic suspension Place 1 drop into both eyes in the morning and at bedtime.   traZODone (DESYREL) 50 MG tablet TAKE 1 TABLET BY MOUTH AT BEDTIME   valACYclovir (VALTREX) 1000 MG tablet Take 1 tablet by mouth once daily.   VITAMIN B COMPLEX-C PO Take by mouth.   amoxicillin-clavulanate (AUGMENTIN) 875-125 MG tablet Take 1 tablet by mouth 2 (two) times daily. (Patient not taking: Reported on 02/01/2021)   No facility-administered encounter medications on file as of 02/01/2021.    Allergies (verified) Patient has no known allergies.   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 Added automatically from request for surgery [id number]   Ruptured globe of left eye 12/31/2019   Formatting of this note might be different from the original. 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 Last Assessment & Plan:  Start Meloxicam. Supportive measures reviewed. If not improving we will need further assessment.   Thrombocytopenia ([place]) 04/21/2019   Type 2 diabetes mellitus with hyperglycemia, without long-term current use of insulin ([place]) 06/26/2019   Type 2 diabetes mellitus with proliferative retinopathy of left eye, without long-term current use of insulin ([place]) 06/26/2019   Uncontrolled type 2 diabetes mellitus with hyperglycemia ([place]) 04/18/2019   Vitreous prolapse, left eye 08/12/2018   Formatting of this note might be different from the original. Added automatically from request for surgery [phone]  Formatting of this note might be different from the original. Added automatically from request for surgery [phone]   Past Surgical History:  Procedure Laterality Date   EYE SURGERY     Family History  Problem Relation Age of Onset   Diabetes type II Father    Heart disease Father    Liver cancer Mother    Social History   Socioeconomic History    Marital status: Married    Spouse name: Not on file   Number of children: Not on file   Years of education: Not on file   Highest education level: Not on file  Occupational History   Not on file  Tobacco Use   Smoking status: Never   Smokeless tobacco: Never  Vaping Use   Vaping Use: Never used  Substance and Sexual Activity   Alcohol use: Yes    Comment: just on occassion, rarely   Drug use: No   Sexual activity: Yes  Other Topics Concern   Not on file  Social History Narrative   Part time employment.   Non-denominational pastor.   Social Determinants of Health   Financial Resource Strain: Low Risk    Difficulty of Paying Living Expenses: Not hard at all  Food Insecurity: No Food Insecurity   Worried About Charity fundraiser in the Last Year: Never true   [place] in the Last Year: Never true  Transportation Needs: No Transportation Needs   Lack of Transportation (Medical): No   Lack of Transportation (Non-Medical): No  Physical Activity: Inactive   Days of Exercise per Week: 0 days   Minutes of Exercise per Session:  0 min  Stress: No Stress Concern Present   Feeling of Stress : Not at all  Social Connections: Socially Integrated   Frequency of Communication with Friends and Family: Twice a week   Frequency of Social Gatherings with Friends and Family: Once a week   Attends Religious Services: More than 4 times per year   Active Member of [employer] or Organizations: Yes   Attends Archivist Meetings: Never   Marital Status: Married    Tobacco Counseling Counseling given: Not Answered   Clinical Intake:  Pre-visit preparation completed: Yes  Pain : No/denies pain     Nutritional Risks: None Diabetes: Yes CBG done?: No Did pt. bring in CBG monitor from home?: No  How often do you need to have someone help you when you read instructions, pamphlets, or other written materials from your doctor or pharmacy?: 1 - Never  Diabetic?  Yes    Nutrition Risk Assessment:  Has the patient had any N/V/D within the last 2 months?  No  Does the patient have any non-healing wounds?  No  Has the patient had any unintentional weight loss or weight gain?  No   Diabetes:  Is the patient diabetic?  Yes  If diabetic, was a CBG obtained today?  No  Did the patient bring in their glucometer from home?  No  How often do you monitor your CBG's? 2 times a week.   Financial Strains and Diabetes Management:  Are you having any financial strains with the device, your supplies or your medication? No .  Does the patient want to be seen by Chronic Care Management for management of their diabetes?  Yes  Would the patient like to be referred to a Nutritionist or for Diabetic Management?  No   Diabetic Exams:  Diabetic Eye Exam: Completed . Overdue for diabetic eye exam. Pt has been advised about the importance in completing this exam. A referral has been placed today. Message sent to referral coordinator for scheduling purposes. Advised pt to expect a call from office referred to regarding appt.  Diabetic Foot Exam: Completed . Pt has been advised about the importance in completing this exam.  Interpreter Needed?: No  Information entered by :: Leroy Kennedy LPN   Activities of Daily Living In your present state of health, do you have any difficulty performing the following activities: 02/01/2021  Hearing? Y  Vision? N  Difficulty concentrating or making decisions? N  Walking or climbing stairs? N  Dressing or bathing? N  Doing errands, shopping? N  Preparing Food and eating ? N  Using the Toilet? N  In the past six months, have you accidently leaked urine? N  Do you have problems with loss of bowel control? N  Managing your Medications? N  Managing your Finances? N  Housekeeping or managing your Housekeeping? N  Some recent data might be hidden    Patient Care Team: Libby Maw, MD as PCP - General (Family  Medicine)  Indicate any recent Medical Services you may have received from other than [facility] providers in the past year (date may be approximate).     Assessment:   This is a routine wellness examination for Robert Bowen.  Hearing/Vision screen No results found.  Dietary issues and exercise activities discussed: Current Exercise Habits: The patient does not participate in regular exercise at present   Goals Addressed             This Visit's Progress    Patient Stated  Loose weight       Depression Screen PHQ 2/9 Scores 02/01/2021 09/13/2020 09/13/2020 04/18/2019 04/18/2019 05/10/2018 01/15/2018  PHQ - 2 Score 0 1 2 0 0 3 2  PHQ- 9 Score - 3 - - - 8 3  Exception Documentation - - - Other- indicate reason in comment box - - -  Not completed - - - PHQ-2 negative - - -    Fall Risk Fall Risk  02/01/2021 09/13/2020 07/26/2020 03/01/2016  Falls in the past year? 0 0 0 No  Number falls in past yr: 0 - 0 -  Injury with Fall? 0 - 0 -  Follow up Falls evaluation completed;Falls prevention discussed - - -    FALL RISK PREVENTION PERTAINING TO THE HOME:  Any stairs in or around the home? No  If so, are there any without handrails? No  Home free of loose throw rugs in walkways, pet beds, electrical cords, etc? Yes  Adequate lighting in your home to reduce risk of falls? Yes   ASSISTIVE DEVICES UTILIZED TO PREVENT FALLS:  Life alert? No  Use of a cane, walker or w/c? No  Grab bars in the bathroom? No  Shower chair or bench in shower? No  Elevated toilet seat or a handicapped toilet? No   TIMED UP AND GO:  Was the test performed? No .    Cognitive Function:        Immunizations Immunization History  Administered Date(s) Administered   Fluad Quad(high Dose 65+) 01/27/2021   Influenza,inj,Quad PF,6+ Mos 03/01/2016, 03/01/2017, 01/15/2018, 04/18/2019   PFIZER(Purple Top)SARS-COV-2 Vaccination 06/24/2019, 07/15/2019, 02/20/2020   Tdap 03/01/2014, 12/31/2019    TDAP status:  Up to date  Flu Vaccine status: Up to date  Pneumococcal vaccine status: Due, Education has been provided regarding the importance of this vaccine. Advised may receive this vaccine at local pharmacy or Health Dept. Aware to provide a copy of the vaccination record if obtained from local pharmacy or Health Dept. Verbalized acceptance and understanding.  Covid-19 vaccine status: Information provided on how to obtain vaccines.   Qualifies for Shingles Vaccine? Yes   Zostavax completed No   Shingrix Completed?: No.    Education has been provided regarding the importance of this vaccine. Patient has been advised to call insurance company to determine out of pocket expense if they have not yet received this vaccine. Advised may also receive vaccine at local pharmacy or Health Dept. Verbalized acceptance and understanding.  Screening Tests Health Maintenance  Topic Date Due   Pneumonia Vaccine 75+ Years old (1 - PCV) Never done   Hepatitis C Screening  Never done   COLONOSCOPY (Pts 45-64yr Insurance coverage will need to be confirmed)  Never done   Zoster Vaccines- Shingrix (1 of 2) Never done   COVID-19 Vaccine (4 - Booster for Pfizer series) 04/16/2020   HEMOGLOBIN A1C  05/31/2021   FOOT EXAM  09/13/2021   OPHTHALMOLOGY EXAM  10/27/2021   TETANUS/TDAP  12/30/2029   INFLUENZA VACCINE  Completed   HPV VACCINES  Aged Out    Health Maintenance  Health Maintenance Due  Topic Date Due   Pneumonia Vaccine 66 Years old (1 - PCV) Never done   Hepatitis C Screening  Never done   COLONOSCOPY (Pts 45-426yrInsurance coverage will need to be confirmed)  Never done   Zoster Vaccines- Shingrix (1 of 2) Never done   COVID-19 Vaccine (4 - Booster for [facility]) 04/16/2020    Colorectal cancer screening: Referral to  GI placed  . Pt aware the office will call re: appt.  Lung Cancer Screening: (Low Dose CT Chest recommended if Age 16-80 years, 30 pack-year currently smoking OR have quit w/in  15years.) does not qualify.   Lung Cancer Screening Referral:   Additional Screening:  Hepatitis C Screening: does qualify;   Vision Screening: Recommended annual ophthalmology exams for early detection of glaucoma and other disorders of the eye. Is the patient up to date with their annual eye exam?  Yes  Who is the provider or what is the name of the office in which the patient attends annual eye exams? Dr. Thayer Jew If pt is not established with a provider, would they like to be referred to a provider to establish care? No .   Dental Screening: Recommended annual dental exams for proper oral hygiene  Community Resource Referral / Chronic Care Management: CRR required this visit?  No   CCM required this visit?  No      Plan:     I have personally reviewed and noted the following in the patient's chart:   Medical and social history Use of alcohol, tobacco or illicit drugs  Current medications and supplements including opioid prescriptions. Patient is not currently taking opioid prescriptions. Functional ability and status Nutritional status Physical activity Advanced directives List of other physicians Hospitalizations, surgeries, and ER visits in previous 12 months Vitals Screenings to include cognitive, depression, and falls Referrals and appointments  In addition, I have reviewed and discussed with patient certain preventive protocols, quality metrics, and best practice recommendations. A written personalized care plan for preventive services as well as general preventive health recommendations were provided to patient.     Leroy Kennedy, LPN   [id number]   Nurse Notes:

## 2021-02-01 NOTE — Patient Instructions (Signed)
Robert Bowen , Thank you for taking time to come for your Medicare Wellness Visit. I appreciate your ongoing commitment to your health goals. Please review the following plan we discussed and let me know if I can assist you in the future.   Screening recommendations/referrals: Colonoscopy: Education provided Recommended yearly ophthalmology/optometry visit for glaucoma screening and checkup Recommended yearly dental visit for hygiene and checkup  Vaccinations: Influenza vaccine: up to date Pneumococcal vaccine: Education provided Tdap vaccine: up to date Shingles vaccine: Education provided    Advanced directives: Education provided  Conditions/risks identified:   Next appointment: 03-15-2021 @ 8:30 Dr. Ethelene Hal  Preventive Care 65 Years and Older, Male Preventive care refers to lifestyle choices and visits with your health care provider that can promote health and wellness. What does preventive care include? A yearly physical exam. This is also called an annual well check. Dental exams once or twice a year. Routine eye exams. Ask your health care provider how often you should have your eyes checked. Personal lifestyle choices, including: Daily care of your teeth and gums. Regular physical activity. Eating a healthy diet. Avoiding tobacco and drug use. Limiting alcohol use. Practicing safe sex. Taking low doses of aspirin every day. Taking vitamin and mineral supplements as recommended by your health care provider. What happens during an annual well check? The services and screenings done by your health care provider during your annual well check will depend on your age, overall health, lifestyle risk factors, and family history of disease. Counseling  Your health care provider may ask you questions about your: Alcohol use. Tobacco use. Drug use. Emotional well-being. Home and relationship well-being. Sexual activity. Eating habits. History of falls. Memory and ability to  understand (cognition). Work and work Statistician. Screening  You may have the following tests or measurements: Height, weight, and BMI. Blood pressure. Lipid and cholesterol levels. These may be checked every 5 years, or more frequently if you are over 25 years old. Skin check. Lung cancer screening. You may have this screening every year starting at age 39 if you have a 30-pack-year history of smoking and currently smoke or have quit within the past 15 years. Fecal occult blood test (FOBT) of the stool. You may have this test every year starting at age 25. Flexible sigmoidoscopy or colonoscopy. You may have a sigmoidoscopy every 5 years or a colonoscopy every 10 years starting at age 32. Prostate cancer screening. Recommendations will vary depending on your family history and other risks. Hepatitis C blood test. Hepatitis B blood test. Sexually transmitted disease (STD) testing. Diabetes screening. This is done by checking your blood sugar (glucose) after you have not eaten for a while (fasting). You may have this done every 1-3 years. Abdominal aortic aneurysm (AAA) screening. You may need this if you are a current or former smoker. Osteoporosis. You may be screened starting at age 77 if you are at high risk. Talk with your health care provider about your test results, treatment options, and if necessary, the need for more tests. Vaccines  Your health care provider may recommend certain vaccines, such as: Influenza vaccine. This is recommended every year. Tetanus, diphtheria, and acellular pertussis (Tdap, Td) vaccine. You may need a Td booster every 10 years. Zoster vaccine. You may need this after age 42. Pneumococcal 13-valent conjugate (PCV13) vaccine. One dose is recommended after age 30. Pneumococcal polysaccharide (PPSV23) vaccine. One dose is recommended after age 51. Talk to your health care provider about which screenings and vaccines you  need and how often you need them. This  information is not intended to replace advice given to you by your health care provider. Make sure you discuss any questions you have with your health care provider. Document Released: 04/16/2015 Document Revised: 12/08/2015 Document Reviewed: 01/19/2015 Elsevier Interactive Patient Education  2017 [place] Prevention in the Home Falls can cause injuries. They can happen to people of all ages. There are many things you can do to make your home safe and to help prevent falls. What can I do on the outside of my home? Regularly fix the edges of walkways and driveways and fix any cracks. Remove anything that might make you trip as you walk through a door, such as a raised step or threshold. Trim any bushes or trees on the path to your home. Use bright outdoor lighting. Clear any walking paths of anything that might make someone trip, such as rocks or tools. Regularly check to see if handrails are loose or broken. Make sure that both sides of any steps have handrails. Any raised decks and porches should have guardrails on the edges. Have any leaves, snow, or ice cleared regularly. Use sand or salt on walking paths during winter. Clean up any spills in your garage right away. This includes oil or grease spills. What can I do in the bathroom? Use night lights. Install grab bars by the toilet and in the tub and shower. Do not use towel bars as grab bars. Use non-skid mats or decals in the tub or shower. If you need to sit down in the shower, use a plastic, non-slip stool. Keep the floor dry. Clean up any water that spills on the floor as soon as it happens. Remove soap buildup in the tub or shower regularly. Attach bath mats securely with double-sided non-slip rug tape. Do not have throw rugs and other things on the floor that can make you trip. What can I do in the bedroom? Use night lights. Make sure that you have a light by your bed that is easy to reach. Do not use any sheets or  blankets that are too big for your bed. They should not hang down onto the floor. Have a firm chair that has side arms. You can use this for support while you get dressed. Do not have throw rugs and other things on the floor that can make you trip. What can I do in the kitchen? Clean up any spills right away. Avoid walking on wet floors. Keep items that you use a lot in easy-to-reach places. If you need to reach something above you, use a strong step stool that has a grab bar. Keep electrical cords out of the way. Do not use floor polish or wax that makes floors slippery. If you must use wax, use non-skid floor wax. Do not have throw rugs and other things on the floor that can make you trip. What can I do with my stairs? Do not leave any items on the stairs. Make sure that there are handrails on both sides of the stairs and use them. Fix handrails that are broken or loose. Make sure that handrails are as long as the stairways. Check any carpeting to make sure that it is firmly attached to the stairs. Fix any carpet that is loose or worn. Avoid having throw rugs at the top or bottom of the stairs. If you do have throw rugs, attach them to the floor with carpet tape. Make sure that  you have a light switch at the top of the stairs and the bottom of the stairs. If you do not have them, ask someone to add them for you. What else can I do to help prevent falls? Wear shoes that: Do not have high heels. Have rubber bottoms. Are comfortable and fit you well. Are closed at the toe. Do not wear sandals. If you use a stepladder: Make sure that it is fully opened. Do not climb a closed stepladder. Make sure that both sides of the stepladder are locked into place. Ask someone to hold it for you, if possible. Clearly mark and make sure that you can see: Any grab bars or handrails. First and last steps. Where the edge of each step is. Use tools that help you move around (mobility aids) if they are  needed. These include: Canes. Walkers. Scooters. Crutches. Turn on the lights when you go into a dark area. Replace any light bulbs as soon as they burn out. Set up your furniture so you have a clear path. Avoid moving your furniture around. If any of your floors are uneven, fix them. If there are any pets around you, be aware of where they are. Review your medicines with your doctor. Some medicines can make you feel dizzy. This can increase your chance of falling. Ask your doctor what other things that you can do to help prevent falls. This information is not intended to replace advice given to you by your health care provider. Make sure you discuss any questions you have with your health care provider. Document Released: 01/14/2009 Document Revised: 08/26/2015 Document Reviewed: 04/24/2014 Elsevier Interactive Patient Education  2017 [employer].

## 2021-02-08 ENCOUNTER — Telehealth: Payer: Self-pay | Admitting: Family Medicine

## 2021-02-08 NOTE — Telephone Encounter (Signed)
Please advise message below per patient he does not feel like there have been much improvement not sure what he should do, another round of antibiotics or re evaluation?

## 2021-02-09 ENCOUNTER — Ambulatory Visit: Payer: Medicare Other | Admitting: Family Medicine

## 2021-02-09 ENCOUNTER — Encounter: Payer: Self-pay | Admitting: Family Medicine

## 2021-02-09 ENCOUNTER — Other Ambulatory Visit (HOSPITAL_COMMUNITY): Payer: Self-pay

## 2021-02-09 ENCOUNTER — Other Ambulatory Visit: Payer: Self-pay

## 2021-02-09 VITALS — BP 126/70 | HR 65 | Temp 97.6°F | Ht 72.0 in | Wt 183.6 lb

## 2021-02-09 DIAGNOSIS — J0110 Acute frontal sinusitis, unspecified: Secondary | ICD-10-CM

## 2021-02-09 DIAGNOSIS — J4520 Mild intermittent asthma, uncomplicated: Secondary | ICD-10-CM

## 2021-02-09 DIAGNOSIS — H6982 Other specified disorders of Eustachian tube, left ear: Secondary | ICD-10-CM

## 2021-02-09 DIAGNOSIS — H6992 Unspecified Eustachian tube disorder, left ear: Secondary | ICD-10-CM

## 2021-02-09 HISTORY — DX: Other specified disorders of eustachian tube, left ear: H69.82

## 2021-02-09 HISTORY — DX: Acute frontal sinusitis, unspecified: J01.10

## 2021-02-09 HISTORY — DX: Unspecified Eustachian tube disorder, left ear: H69.92

## 2021-02-09 MED ORDER — PREDNISONE 20 MG PO TABS
20.0000 mg | ORAL_TABLET | Freq: Two times a day (BID) | ORAL | 0 refills | Status: AC
Start: 2021-02-09 — End: 2021-02-16
  Filled 2021-02-09: qty 14, 7d supply, fill #0

## 2021-02-09 NOTE — Progress Notes (Signed)
Established Patient Office Visit  Subjective:  Patient ID: Robert Bowen, male    DOB: [birth year]  Age: 65 y.o. MRN: [id number]  CC:  Chief Complaint  Patient presents with   Cough    Cough, headaches, wheezing and fatigue seen Dr. Gena Fray last week give antibiotics no improvement symptoms x 2 weeks.     HPI Robert Bowen presents for a follow-up of a 2-week history of URI signs and symptoms.  He has experienced facial pressure with a headache on top of his head nasal congestion, ear congestion and postnasal drip.  More recently there is a mild dry cough with mild wheezing.  There is been no phlegm.  He has had no recent fever.  No history of asthma.  He has never smoked.  Does have a history of reactive airway disease.  Hearing is diminished in his left ear.  History of diabetes that is currently well controlled.  Last hemoglobin A1c was 6.8.  Headache has lessened since last week but it is still there.  He has had no rash.  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 Added automatically from request for surgery [phone]   Ruptured globe of left eye 12/31/2019   Formatting of this note might be different from the original. 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 Last Assessment & Plan:  Start Meloxicam. Supportive measures reviewed. If not improving we will need further assessment.   Thrombocytopenia ([place]) 04/21/2019   Type 2 diabetes mellitus with hyperglycemia, without long-term current use of insulin ([place]) 06/26/2019   Type 2 diabetes mellitus with proliferative retinopathy of left eye, without long-term current use of insulin ([place]) 06/26/2019   Uncontrolled type 2 diabetes mellitus with hyperglycemia ([place]) 04/18/2019   Vitreous prolapse, left eye 08/12/2018   Formatting of this note might be different from the original. Added automatically from request for surgery [phone]  Formatting of this note might be different from the original.  Added automatically from request for surgery [id number]    Past Surgical History:  Procedure Laterality Date   EYE SURGERY      Family History  Problem Relation Age of Onset   Diabetes type II Father    Heart disease Father    Liver cancer Mother     Social History   Socioeconomic History   Marital status: Married     Spouse name: Not on file   Number of children: Not on file   Years of education: Not on file   Highest education level: Not on file  Occupational History   Not on file  Tobacco Use   Smoking status: Never   Smokeless tobacco: Never  Vaping Use   Vaping Use: Never used  Substance and Sexual Activity   Alcohol use: Yes    Comment: just on occassion, rarely   Drug use: No   Sexual activity: Yes  Other Topics Concern   Not on file  Social History Narrative   Part time employment.   Non-denominational pastor.   Social Determinants of Health   Financial Resource Strain: Low Risk    Difficulty of Paying Living Expenses: Not hard at all  Food Insecurity: No Food Insecurity   Worried About Charity fundraiser in the Last Year: Never true   [place] in the Last Year: Never true  Transportation Needs: No Transportation Needs   Lack of Transportation (Medical): No   Lack of Transportation (Non-Medical): No  Physical Activity: Inactive   Days of Exercise per Week: 0 days   Minutes of Exercise per Session: 0 min  Stress: No Stress Concern Present   Feeling of Stress : Not at all  Social Connections: Socially Integrated   Frequency of Communication with Friends and Family: Twice a week   Frequency of Social Gatherings with Friends and Family: Once a week   Attends Religious Services: More than 4 times per year   Active Member of [employer] or Organizations: Yes   Attends Archivist Meetings: Never   Marital Status: Married  Human resources officer Violence: Not At Risk   Fear of Current or Ex-Partner: No   Emotionally Abused: No   Physically Abused: No   Sexually Abused: No    Outpatient Medications Prior to Visit  Medication Sig Dispense Refill   atorvastatin (LIPITOR) 20 MG tablet TAKE 1 TABLET (20 MG TOTAL) BY MOUTH DAILY. MUST MAKE APPT FOR REFILLS 90 tablet 0   Blood Glucose Monitoring Suppl (ONETOUCH VERIO FLEX SYSTEM) w/Device KIT USE AS DIRECTED TO CHECK 2 TIMES  DAILY 1 kit 0   brimonidine (ALPHAGAN) 0.2 % ophthalmic solution Place 1 drop into the left eye 2 times daily. 15 mL 11   citalopram (CELEXA) 40 MG tablet TAKE 1 TABLET BY MOUTH ONCE A DAY 90 tablet 0   dapagliflozin propanediol (FARXIGA) 10 MG TABS tablet Take 1 tablet (10 mg total) by mouth daily. 90 tablet 3   dorzolamide-timolol (COSOPT) 22.3-6.8 MG/ML ophthalmic solution PLACE 1 DROP IN THE LEFT EYE 2 TIMES DAILY 30 mL 3   fluticasone (FLONASE) 50 MCG/ACT nasal spray Place 2 sprays into both nostrils daily. 16 g 6   glipiZIDE (GLUCOTROL) 5 MG tablet Take 1 tablet (5 mg total) by mouth daily before breakfast. 90 tablet 3   glucose blood test strip USE 1 TWICE DAILY AS DIRECTED (Patient taking differently: USE 1 TWICE DAILY AS DIRECTED) 100 strip 12   Lancets (ONETOUCH DELICA PLUS TMHDQQ22L) MISC  USE TO TEST BLOOD GLUCOSE 2 TIMES DAILY AS DIRECTED 100 each 12   Lancets (ONETOUCH ULTRASOFT) lancets Use as instructed 100 each 12   losartan (COZAAR) 25 MG tablet TAKE 1 TABLET BY MOUTH ONCE DAILY 100 tablet 1   metFORMIN (GLUCOPHAGE-XR) 750 MG 24 hr tablet Take 1 tablet by mouth 2  times daily with a meal. 180 tablet 3   Multiple Vitamin (MULTIVITAMIN) tablet Take 1 tablet by mouth daily.     prednisoLONE acetate (PRED FORTE) 1 % ophthalmic suspension Place 1 drop into both eyes in the morning and at bedtime. 15 mL 11   traZODone (DESYREL) 50 MG tablet TAKE 1 TABLET BY MOUTH AT BEDTIME 90 tablet 1   valACYclovir (VALTREX) 1000 MG tablet Take 1 tablet by mouth once daily. 90 tablet 5   VITAMIN B COMPLEX-C PO Take by mouth.     amoxicillin-clavulanate (AUGMENTIN) 875-125 MG tablet Take 1 tablet by mouth 2 (two) times daily. (Patient not taking: Reported on 02/01/2021) 20 tablet 0   No facility-administered medications prior to visit.    No Known Allergies  ROS Review of Systems  Constitutional:  Positive for fatigue. Negative for diaphoresis, fever and unexpected weight change.  HENT:   Positive for congestion, hearing loss, postnasal drip, sinus pressure and sinus pain. Negative for ear pain and sore throat.   Eyes:  Positive for visual disturbance. Negative for photophobia.  Respiratory:  Positive for cough and wheezing. Negative for chest tightness and shortness of breath.   Cardiovascular:  Negative for chest pain and palpitations.  Gastrointestinal: Negative.   Musculoskeletal:  Negative for gait problem and joint swelling.  Skin:  Negative for rash.  Neurological:  Positive for headaches. Negative for facial asymmetry, speech difficulty and weakness.  Psychiatric/Behavioral: Negative.       Objective:    Physical Exam Vitals and nursing note reviewed.  Constitutional:      General: He is not in acute distress.    Appearance: Normal appearance. He is not ill-appearing, toxic-appearing or diaphoretic.  HENT:     Head: Normocephalic and atraumatic.     Right Ear: Tympanic membrane and ear canal normal. Tympanic membrane is not injected or erythematous.     Left Ear: Ear canal normal. Tympanic membrane is retracted. Tympanic membrane is not injected or erythematous.     Mouth/Throat:     Mouth: Mucous membranes are moist.     Pharynx: Oropharynx is clear. No oropharyngeal exudate or posterior oropharyngeal erythema.  Eyes:     General: No scleral icterus.       Right eye: No discharge.        Left eye: No discharge.     Conjunctiva/sclera: Conjunctivae normal.  Neck:     Vascular: No carotid bruit.  Cardiovascular:     Rate and Rhythm: Normal rate and regular rhythm.  Pulmonary:     Effort: Pulmonary effort is normal. No respiratory distress.     Breath sounds: Normal breath sounds. No wheezing or rales.  Abdominal:     General: Bowel sounds are normal.  Musculoskeletal:     Cervical back: No rigidity or tenderness.  Lymphadenopathy:     Cervical: No cervical adenopathy.  Skin:    General: Skin is warm and dry.     Findings: No rash.  Neurological:      Mental Status: He is alert and oriented to person, place, and time.     Cranial Nerves: No dysarthria or facial asymmetry.  Psychiatric:  Mood and Affect: Mood normal.        Behavior: Behavior normal.    BP 126/70 (BP Location: Right Arm, Patient Position: Sitting, Cuff Size: Normal)   Pulse 65   Temp 97.6 F (36.4 C) (Temporal)   Ht 6' (1.829 m)   Wt 183 lb 9.6 oz (83.3 kg)   SpO2 98%   BMI 24.90 kg/m  Wt Readings from Last 3 Encounters:  02/09/21 183 lb 9.6 oz (83.3 kg)  01/27/21 185 lb 9.6 oz (84.2 kg)  11/30/20 181 lb (82.1 kg)     Health Maintenance Due  Topic Date Due   Pneumonia Vaccine 21+ Years old (1 - PCV) Never done   Hepatitis C Screening  Never done   COLONOSCOPY (Pts 45-59yr Insurance coverage will need to be confirmed)  Never done   Zoster Vaccines- Shingrix (1 of 2) Never done    There are no preventive care reminders to display for this patient.  Lab Results  Component Value Date   TSH 1.42 01/27/2021   Lab Results  Component Value Date   WBC 5.6 01/27/2021   HGB 14.3 01/27/2021   HCT 40.2 01/27/2021   MCV 88.9 01/27/2021   PLT 92.0 (L) 01/27/2021   Lab Results  Component Value Date   NA 136 01/27/2021   K 4.2 01/27/2021   CO2 31 01/27/2021   GLUCOSE 204 (H) 01/27/2021   BUN 11 01/27/2021   CREATININE 0.96 01/27/2021   BILITOT 1.1 01/27/2021   ALKPHOS 65 01/27/2021   AST 21 01/27/2021   ALT 24 01/27/2021   PROT 7.2 01/27/2021   ALBUMIN 4.6 01/27/2021   CALCIUM 9.4 01/27/2021   ANIONGAP 7 11/19/2020   GFR 82.55 01/27/2021   Lab Results  Component Value Date   CHOL 111 09/13/2020   Lab Results  Component Value Date   HDL 32.50 (L) 09/13/2020   Lab Results  Component Value Date   LDLCALC 56 09/13/2020   Lab Results  Component Value Date   TRIG 109.0 09/13/2020   Lab Results  Component Value Date   CHOLHDL 3 09/13/2020   Lab Results  Component Value Date   HGBA1C 6.8 (A) 11/30/2020      Assessment & Plan:    Problem List Items Addressed This Visit       Respiratory   Reactive airway disease   Relevant Medications   predniSONE (DELTASONE) 20 MG tablet   Acute frontal sinusitis - Primary   Relevant Medications   predniSONE (DELTASONE) 20 MG tablet     Nervous and Auditory   Dysfunction of left eustachian tube   Relevant Medications   predniSONE (DELTASONE) 20 MG tablet    Meds ordered this encounter  Medications   predniSONE (DELTASONE) 20 MG tablet    Sig: Take 1 tablet by mouth 2 (two) times daily with a meal for 7 days.    Dispense:  14 tablet    Refill:  0    Follow-up: Return in about 1 week (around 02/16/2021), or To ER for evaluation if headache worsens..Libby Maw MD

## 2021-02-09 NOTE — Telephone Encounter (Signed)
Appointment scheduled for follow up 

## 2021-02-10 ENCOUNTER — Other Ambulatory Visit: Payer: Self-pay | Admitting: Family Medicine

## 2021-02-10 ENCOUNTER — Other Ambulatory Visit (HOSPITAL_COMMUNITY): Payer: Self-pay

## 2021-02-10 DIAGNOSIS — F32A Depression, unspecified: Secondary | ICD-10-CM

## 2021-02-10 DIAGNOSIS — F419 Anxiety disorder, unspecified: Secondary | ICD-10-CM

## 2021-02-11 ENCOUNTER — Other Ambulatory Visit (HOSPITAL_COMMUNITY): Payer: Self-pay

## 2021-02-11 MED ORDER — CITALOPRAM HYDROBROMIDE 40 MG PO TABS
ORAL_TABLET | Freq: Every day | ORAL | 0 refills | Status: DC
  Filled 2021-02-11: qty 90, 90d supply, fill #0

## 2021-02-15 ENCOUNTER — Encounter: Payer: Self-pay | Admitting: Family Medicine

## 2021-02-15 ENCOUNTER — Ambulatory Visit (INDEPENDENT_AMBULATORY_CARE_PROVIDER_SITE_OTHER): Payer: Medicare Other | Admitting: Family Medicine

## 2021-02-15 ENCOUNTER — Other Ambulatory Visit: Payer: Self-pay

## 2021-02-15 VITALS — BP 136/74 | HR 70 | Temp 97.9°F | Ht 72.0 in | Wt 181.4 lb

## 2021-02-15 DIAGNOSIS — H9192 Unspecified hearing loss, left ear: Secondary | ICD-10-CM | POA: Diagnosis not present

## 2021-02-15 DIAGNOSIS — R519 Headache, unspecified: Secondary | ICD-10-CM | POA: Diagnosis not present

## 2021-02-15 NOTE — Progress Notes (Signed)
[facility] [address] [place] [address] Dept: [phone] Dept Fax: [phone]  Office Visit  Subjective:    Patient ID: Robert Bowen, male    DOB: 1955/07/08, 65 y.o..   MRN: [id number]  Chief Complaint  Patient presents with   Acute Visit    C/o having fatigue, some HA's x 3 weeks, ST x 2 days.  He finished the meds from last week .     History of Present Illness:  Patient is in today for with ongoing issues with fatigue, headache, decreased hearing in the left ear, and sore throat.   I had seen Robert Bowen on 10/27. At that point, he had 3-4 days of general fatigue and low energy, esp. after getting out to do work. He had not noted any shortness of breath. He also noted generalized headache, pressure over his frontal and maxillary sinuses and decreased hearing in his left ear and some mild tenderness behind and below his left external ear. I had treated him with a course of Augmentin. He saw Robert Bowen back on 11/9 with ongoing issues with facial pressure, headache, but now with nasal congestion, ear congestion and postnasal drip. He also had mild cough with wheezing. Robert Bowen treated him with a course of prednisone.  Today, Robert Bowen notes his headache is more frontal over the eye brows. He denies any nasal congestion or rhinorrhea. He continues to feel like he has decreased hearing in the left ear. He also has some scratchiness to his trhoat and voice. He also continues to note a more general sense of fatigue.  Past Medical History: Patient Active Problem List   Diagnosis Date Noted   Dysfunction of left eustachian tube 02/09/2021   Acute frontal sinusitis 02/09/2021   Diastasis recti 01/27/2021   Bradycardia 01/27/2021   Ruptured globe of left eye 12/31/2019   Diabetes mellitus due to underlying condition with unspecified complications ([place]) 72/53/6644   Palpitations 12/17/2019   Juvenile cataract of  both eyes    Type 2 diabetes mellitus with hyperglycemia, without long-term current use of insulin ([place]) 06/26/2019   Type 2 diabetes mellitus with proliferative retinopathy of left eye, without long-term current use of insulin ([place]) 06/26/2019   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   Thrombocytopenia ([place]) 04/21/2019   Uncontrolled type 2 diabetes mellitus with hyperglycemia ([place]) 04/18/2019   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   Vitreous prolapse, left eye 08/12/2018   Anterior capsular phimosis of lens 06/17/2018   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 associated with type 2 diabetes mellitus ([place]) 06/17/2018   Mixed dyslipidemia 06/03/2018   Nonintractable headache 05/10/2018   DOE (dyspnea on exertion) 03/47/4259   Other complicated headache syndrome 05/10/2018   Elevated LDL cholesterol level 05/10/2018   Feeling light headed 04/22/2018   Sinusitis 04/15/2018   Reactive airway disease 04/15/2018   Retinal edema 03/04/2018   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   Need for influenza vaccination 01/17/2018   Elevated BP without diagnosis of hypertension 01/15/2018   Depression, major, single episode, in partial remission ([place]) 01/15/2018   Strain of lumbar region 12/10/2017   Healthcare maintenance 10/15/2017   Tendinopathy of left rotator cuff 03/01/2017   Anxiety and depression 03/02/2016   Controlled type 2 diabetes mellitus without complication, without long-term current use of insulin ([place]) 03/02/2016   Diabetes mellitus ([place]) 01/11/2012   Childhood cataract, bilateral 01/11/2012   Glaucoma 01/11/2012   Diabetes  mellitus, type 2 ([place]) 1988   Past Surgical History:  Procedure Laterality Date   EYE SURGERY     Family History  Problem Relation Age of Onset   Diabetes type II Father    Heart disease Father    Liver cancer Mother    Outpatient Medications Prior to Visit  Medication Sig  Dispense Refill   atorvastatin (LIPITOR) 20 MG tablet TAKE 1 TABLET (20 MG TOTAL) BY MOUTH DAILY. MUST MAKE APPT FOR REFILLS 90 tablet 0   Blood Glucose Monitoring Suppl (ONETOUCH VERIO FLEX SYSTEM) w/Device KIT USE AS DIRECTED TO CHECK 2 TIMES DAILY 1 kit 0   brimonidine (ALPHAGAN) 0.2 % ophthalmic solution Place 1 drop into the left eye 2 times daily. 15 mL 11   citalopram (CELEXA) 40 MG tablet TAKE 1 TABLET BY MOUTH ONCE A DAY 90 tablet 0   dapagliflozin propanediol (FARXIGA) 10 MG TABS tablet Take 1 tablet (10 mg total) by mouth daily. 90 tablet 3   dorzolamide-timolol (COSOPT) 22.3-6.8 MG/ML ophthalmic solution PLACE 1 DROP IN THE LEFT EYE 2 TIMES DAILY 30 mL 3   fluticasone (FLONASE) 50 MCG/ACT nasal spray Place 2 sprays into both nostrils daily. 16 g 6   glipiZIDE (GLUCOTROL) 5 MG tablet Take 1 tablet (5 mg total) by mouth daily before breakfast. 90 tablet 3   glucose blood test strip USE 1 TWICE DAILY AS DIRECTED (Patient taking differently: USE 1 TWICE DAILY AS DIRECTED) 100 strip 12   Lancets (ONETOUCH DELICA PLUS [id number]) MISC USE TO TEST BLOOD GLUCOSE 2 TIMES DAILY AS DIRECTED 100 each 12   Lancets (ONETOUCH ULTRASOFT) lancets Use as instructed 100 each 12   losartan (COZAAR) 25 MG tablet TAKE 1 TABLET BY MOUTH ONCE DAILY 100 tablet 1   metFORMIN (GLUCOPHAGE-XR) 750 MG 24 hr tablet Take 1 tablet by mouth 2  times daily with a meal. 180 tablet 3   Multiple Vitamin (MULTIVITAMIN) tablet Take 1 tablet by mouth daily.     prednisoLONE acetate (PRED FORTE) 1 % ophthalmic suspension Place 1 drop into both eyes in the morning and at bedtime. 15 mL 11   predniSONE (DELTASONE) 20 MG tablet Take 1 tablet by mouth 2 (two) times daily with a meal for 7 days. 14 tablet 0   traZODone (DESYREL) 50 MG tablet TAKE 1 TABLET BY MOUTH AT BEDTIME 90 tablet 1   valACYclovir (VALTREX) 1000 MG tablet Take 1 tablet by mouth once daily. 90 tablet 5   VITAMIN B COMPLEX-C PO Take by mouth.     No  facility-administered medications prior to visit.   No Known Allergies    Objective:   Today's Vitals   02/15/21 1505  BP: 136/74  Pulse: 70  Temp: 97.9 F (36.6 C)  TempSrc: Temporal  SpO2: 99%  Weight: 181 lb 6.4 oz (82.3 kg)  Height: 6' (1.829 m)   Body mass index is 24.6 kg/m.   General: Well developed, well nourished. No acute distress. HEENT: Normocephalic, non-traumatic. External ears normal. EAC and TMs normal bilaterally. Nose    clear without congestion or rhinorrhea. Mucous membranes moist. Oropharynx clear. Neck: Supple. No lymphadenopathy. No thyromegaly. Lungs: Clear to auscultation bilaterally. No wheezing, rales or rhonchi. CV: RRR without murmurs or rubs. Pulses 2+ bilaterally. Psych: Alert and oriented. Normal mood and affect.  Health Maintenance Due  Topic Date Due   Pneumonia Vaccine 22+ Years old (1 - PCV) Never done   Hepatitis C Screening  Never done   COLONOSCOPY (  Pts 45-19yr Insurance coverage will need to be confirmed)  Never done   Zoster Vaccines- Shingrix (1 of 2) Never done   Lab Results: CBC Latest Ref Rng & Units 01/27/2021 11/19/2020 09/13/2020  WBC 4.0 - 10.5 K/uL 5.6 5.3 4.8  Hemoglobin 13.0 - 17.0 g/dL 14.3 15.5 16.0  Hematocrit 39.0 - 52.0 % 40.2 42.4 45.1  Platelets 150.0 - 400.0 K/uL 92.0(L) 93(L) 97.0 Repeated and verified X2.(L)   CMP Latest Ref Rng & Units 01/27/2021 11/19/2020 09/13/2020  Glucose 70 - 99 mg/dL 204(H) 208(H) 165(H)  BUN 6 - 23 mg/dL '11 15 14  ' Creatinine 0.40 - 1.50 mg/dL 0.96 1.06 0.95  Sodium 135 - 145 mEq/L 136 138 139  Potassium 3.5 - 5.1 mEq/L 4.2 4.4 4.1  Chloride 96 - 112 mEq/L 100 101 101  CO2 19 - 32 mEq/L '31 30 29  ' Calcium 8.4 - 10.5 mg/dL 9.4 9.5 9.6  Total Protein 6.0 - 8.3 g/dL 7.2 7.5 7.6  Total Bilirubin 0.2 - 1.2 mg/dL 1.1 1.1 1.4(H)  Alkaline Phos 39 - 117 U/L 65 76 71  AST 0 - 37 U/L '21 21 20  ' ALT 0 - 53 U/L '24 31 23   ' Lab Results  Component Value Date   TSH 1.42 01/27/2021      Assessment & Plan:   1. Frontal headache 2. Hearing loss of left ear, unspecified hearing loss type The source of Robert Bowen remains elusive. He no longer has the bradycardia he had 3 weeks ago. His lab evaluation was normal except for an elevated blood sugar and mild chronic thrombocytopenia. I will check a maxillofacial CT to assess for persistent sinusitis. Hopefully, this will also pick up the left ear area as well.  - CT Maxillofacial WO CM; Future  SHaydee Salter MD

## 2021-02-21 ENCOUNTER — Other Ambulatory Visit: Payer: Self-pay

## 2021-02-21 ENCOUNTER — Ambulatory Visit: Payer: Medicare Other | Admitting: Family Medicine

## 2021-02-22 ENCOUNTER — Other Ambulatory Visit: Payer: Self-pay

## 2021-02-22 ENCOUNTER — Ambulatory Visit: Payer: Medicare Other | Admitting: Cardiology

## 2021-02-22 ENCOUNTER — Encounter: Payer: Self-pay | Admitting: Cardiology

## 2021-02-22 ENCOUNTER — Ambulatory Visit
Admission: RE | Admit: 2021-02-22 | Discharge: 2021-02-22 | Disposition: A | Discharge status: Home / Self Care | Payer: Medicare Other | Source: Ambulatory Visit | Attending: Family Medicine | Admitting: Family Medicine

## 2021-02-22 VITALS — BP 132/64 | HR 76 | Ht 72.0 in | Wt 187.0 lb

## 2021-02-22 DIAGNOSIS — E782 Mixed hyperlipidemia: Secondary | ICD-10-CM

## 2021-02-22 DIAGNOSIS — H9192 Unspecified hearing loss, left ear: Secondary | ICD-10-CM

## 2021-02-22 DIAGNOSIS — R002 Palpitations: Secondary | ICD-10-CM | POA: Diagnosis not present

## 2021-02-22 DIAGNOSIS — E119 Type 2 diabetes mellitus without complications: Secondary | ICD-10-CM

## 2021-02-22 DIAGNOSIS — R519 Headache, unspecified: Secondary | ICD-10-CM

## 2021-02-22 NOTE — Progress Notes (Signed)
Cardiology Office Note:    Date:  02/22/2021   ID:  Robert Bowen, DOB 03-Jun-1955, MRN [id number]  PCP:  Robert Maw, MD  Cardiologist:  Robert Lindau, MD   Referring MD: Robert Salter, MD    ASSESSMENT:    1. Controlled type 2 diabetes mellitus without complication, without long-term current use of insulin ([place])   2. Mixed dyslipidemia   3. Palpitations    PLAN:    In order of problems listed above:  Primary prevention stressed to the patient.  Importance of compliance with diet medication stressed any vocalized understanding. Sinus bradycardia: Stable at this time.  Heart rate is 76 today.  Patient is completely asymptomatic.  I reassured him about my findings today. Mixed dyslipidemia: On statin therapy.  Lipids reviewed.  Diet was emphasized. Diabetes mellitus with endorgan damage: Patient is doing his best to control diabetes mellitus and to be compliant with medical advice.  His hemoglobin A1c done last was elevated and I educated him about it.  Precautions advised.  I told him to exercise to the best of his ability.  He leads a sedentary lifestyle and I questioned him about this. Patient will be seen in follow-up appointment in 9 months or earlier if the patient has any concerns    Medication Adjustments/Labs and Tests Ordered: Current medicines are reviewed at length with the patient today.  Concerns regarding medicines are outlined above.  Orders Placed This Encounter  Procedures   EKG 12-Lead    No orders of the defined types were placed in this encounter.    No chief complaint on file.    History of Present Illness:    Robert Bowen is a 65 y.o. male.  Patient has past medical history of mixed dyslipidemia and diabetes mellitus with endorgan damage.  He denies any problems at this time and takes care of activities of daily living.  No chest pain orthopnea or PND.  He mentions to me that his doctor was concerned because his heart rate was  in the high 40s.  Patient has no dizziness or syncope or any such issues.  At the time of my evaluation, the patient is alert awake oriented and in no distress.  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 Added automatically from request for surgery [id number]   Ruptured globe of left eye 12/31/2019   Formatting of this note might be different from the original. 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 Last Assessment & Plan:  Start Meloxicam. Supportive measures reviewed. If not improving we will need further assessment.   Thrombocytopenia ([place]) 04/21/2019   Type 2 diabetes mellitus with hyperglycemia, without long-term current use of insulin ([place]) 06/26/2019   Type 2 diabetes mellitus with proliferative retinopathy of left eye, without long-term current use of insulin ([place]) 06/26/2019   Uncontrolled type 2 diabetes mellitus with hyperglycemia ([place]) 04/18/2019   Vitreous prolapse, left eye 08/12/2018   Formatting of this note might be different from the original. Added automatically from request for surgery [id number]  Formatting of this note might be different from the original. Added automatically from request for surgery [id number]    Past Surgical History:  Procedure Laterality Date   EYE SURGERY      Current Medications: Current Meds  Medication Sig   atorvastatin (LIPITOR) 20 MG tablet TAKE 1 TABLET (20 MG TOTAL) BY MOUTH DAILY. MUST MAKE APPT FOR REFILLS   Blood Glucose Monitoring Suppl (ONETOUCH VERIO FLEX SYSTEM) w/Device KIT USE AS DIRECTED TO CHECK 2 TIMES DAILY   brimonidine (ALPHAGAN) 0.2 % ophthalmic solution Place 1 drop into the left eye 2 times daily.   citalopram (CELEXA) 40 MG tablet TAKE 1 TABLET BY MOUTH ONCE A DAY   dapagliflozin propanediol (FARXIGA) 10 MG TABS tablet Take 1 tablet (10 mg total) by mouth daily.   dorzolamide-timolol (COSOPT) 22.3-6.8 MG/ML ophthalmic solution PLACE 1 DROP IN THE LEFT EYE 2 TIMES DAILY   fluticasone (FLONASE) 50 MCG/ACT nasal spray Place 2 sprays into both nostrils daily.   glipiZIDE (GLUCOTROL) 5 MG tablet Take 1 tablet (5 mg total) by mouth daily before  breakfast.   Lancets (ONETOUCH DELICA PLUS [id number]) MISC USE TO TEST BLOOD GLUCOSE 2 TIMES DAILY AS DIRECTED   losartan (COZAAR) 25 MG tablet TAKE 1 TABLET BY MOUTH ONCE DAILY   metFORMIN (GLUCOPHAGE-XR) 750 MG 24 hr tablet Take 1 tablet by mouth 2  times daily with a meal.   Multiple Vitamin (MULTIVITAMIN) tablet Take 1 tablet by mouth daily.   prednisoLONE acetate (PRED FORTE) 1 % ophthalmic suspension Place 1 drop into both eyes in the morning and at bedtime.   traZODone (DESYREL) 50 MG tablet TAKE 1 TABLET BY MOUTH AT BEDTIME   valACYclovir (VALTREX) 1000 MG tablet Take 1 tablet by mouth once daily.   VITAMIN B COMPLEX-C PO Take 1 capsule by mouth daily.     Allergies:   Patient has no known allergies.   Social History   Socioeconomic History   Marital status: Married    Spouse name: Not on file   Number of children: Not on file   Years of education: Not on file   Highest education level: Not on file  Occupational History   Not on file  Tobacco Use   Smoking status: Never   Smokeless  tobacco: Never  Vaping Use   Vaping Use: Never used  Substance and Sexual Activity   Alcohol use: Yes    Comment: just on occassion, rarely   Drug use: No   Sexual activity: Yes  Other Topics Concern   Not on file  Social History Narrative   Part time employment.   Non-denominational pastor.   Social Determinants of Health   Financial Resource Strain: Low Risk    Difficulty of Paying Living Expenses: Not hard at all  Food Insecurity: No Food Insecurity   Worried About Charity fundraiser in the Last Year: Never true   [place] in the Last Year: Never true  Transportation Needs: No Transportation Needs   Lack of Transportation (Medical): No   Lack of Transportation (Non-Medical): No  Physical Activity: Inactive   Days of Exercise per Week: 0 days   Minutes of Exercise per Session: 0 min  Stress: No Stress Concern Present   Feeling of Stress : Not at all  Social  Connections: Socially Integrated   Frequency of Communication with Friends and Family: Twice a week   Frequency of Social Gatherings with Friends and Family: Once a week   Attends Religious Services: More than 4 times per year   Active Member of [employer] or Organizations: Yes   Attends Archivist Meetings: Never   Marital Status: Married     Family History: The patient's family history includes Diabetes type II in his father; Heart disease in his father; Liver cancer in his mother.  ROS:   Please see the history of present illness.    All other systems reviewed and are negative.  EKGs/Labs/Other Studies Reviewed:    The following studies were reviewed today: EKG reveals sinus rhythm and nonspecific ST-T changes   Recent Labs: 01/27/2021: ALT 24; BUN 11; Creatinine, Ser 0.96; Hemoglobin 14.3; Platelets 92.0; Potassium 4.2; Sodium 136; TSH 1.42  Recent Lipid Panel    Component Value Date/Time   CHOL 111 09/13/2020 0936   TRIG 109.0 09/13/2020 0936   HDL 32.50 (L) 09/13/2020 0936   CHOLHDL 3 09/13/2020 0936   VLDL 21.8 09/13/2020 0936   LDLCALC 56 09/13/2020 0936   LDLDIRECT 97.0 05/10/2018 1033    Physical Exam:    VS:  BP 132/64   Pulse 76   Ht 6' (1.829 m)   Wt 187 lb 0.6 oz (84.8 kg)   SpO2 99%   BMI 25.37 kg/m     Wt Readings from Last 3 Encounters:  02/22/21 187 lb 0.6 oz (84.8 kg)  02/15/21 181 lb 6.4 oz (82.3 kg)  02/09/21 183 lb 9.6 oz (83.3 kg)     GEN: Patient is in no acute distress HEENT: Normal NECK: No JVD; No carotid bruits LYMPHATICS: No lymphadenopathy CARDIAC: Hear sounds regular, 2/6 systolic murmur at the apex. RESPIRATORY:  Clear to auscultation without rales, wheezing or rhonchi  ABDOMEN: Soft, non-tender, non-distended MUSCULOSKELETAL:  No edema; No deformity  SKIN: Warm and dry NEUROLOGIC:  Alert and oriented x 3 PSYCHIATRIC:  Normal affect   Signed, Robert Lindau, MD  02/22/2021 9:47 AM    [facility]

## 2021-02-22 NOTE — Patient Instructions (Signed)
Medication Instructions:  Your physician recommends that you continue on your current medications as directed. Please refer to the Current Medication list given to you today.  *If you need a refill on your cardiac medications before your next appointment, please call your pharmacy*   Lab Work: None If you have labs (blood work) drawn today and your tests are completely normal, you will receive your results only by: [place] (if you have MyChart) OR A paper copy in the mail If you have any lab test that is abnormal or we need to change your treatment, we will call you to review the results.   Testing/Procedures: None   Follow-Up: At [facility], you and your health needs are our priority.  As part of our continuing mission to provide you with exceptional heart care, we have created designated Provider Care Teams.  These Care Teams include your primary Cardiologist (physician) and Advanced Practice Providers (APPs -  Physician Assistants and Nurse Practitioners) who all work together to provide you with the care you need, when you need it.  We recommend signing up for the patient portal called "MyChart".  Sign up information is provided on this After Visit Summary.  MyChart is used to connect with patients for Virtual Visits (Telemedicine).  Patients are able to view lab/test results, encounter notes, upcoming appointments, etc.  Non-urgent messages can be sent to your provider as well.   To learn more about what you can do with MyChart, go to [email or web address].    Your next appointment:   9 month(s)  The format for your next appointment:   In Person  Provider:   Jyl Heinz, MD    Other Instructions

## 2021-03-03 ENCOUNTER — Ambulatory Visit: Payer: Medicare Other | Admitting: Family Medicine

## 2021-03-04 ENCOUNTER — Other Ambulatory Visit (HOSPITAL_COMMUNITY): Payer: Self-pay

## 2021-03-04 ENCOUNTER — Other Ambulatory Visit: Payer: Self-pay | Admitting: Family Medicine

## 2021-03-04 DIAGNOSIS — E7800 Pure hypercholesterolemia, unspecified: Secondary | ICD-10-CM

## 2021-03-04 MED ORDER — ATORVASTATIN CALCIUM 20 MG PO TABS
ORAL_TABLET | ORAL | 3 refills | Status: DC
  Filled 2021-03-04: qty 90, 90d supply, fill #0
  Filled 2021-08-05: qty 90, 90d supply, fill #1
  Filled 2021-12-10: qty 90, 90d supply, fill #2

## 2021-03-11 ENCOUNTER — Other Ambulatory Visit (HOSPITAL_COMMUNITY): Payer: Self-pay

## 2021-03-14 ENCOUNTER — Other Ambulatory Visit (HOSPITAL_COMMUNITY): Payer: Self-pay

## 2021-03-15 ENCOUNTER — Other Ambulatory Visit (HOSPITAL_COMMUNITY): Payer: Self-pay

## 2021-03-15 ENCOUNTER — Encounter: Payer: Self-pay | Admitting: Family Medicine

## 2021-03-15 ENCOUNTER — Ambulatory Visit (INDEPENDENT_AMBULATORY_CARE_PROVIDER_SITE_OTHER): Payer: Medicare Other | Admitting: Family Medicine

## 2021-03-15 ENCOUNTER — Other Ambulatory Visit: Payer: Self-pay

## 2021-03-15 VITALS — BP 124/70 | HR 55 | Temp 97.6°F | Ht 72.0 in | Wt 188.2 lb

## 2021-03-15 DIAGNOSIS — R0981 Nasal congestion: Secondary | ICD-10-CM | POA: Insufficient documentation

## 2021-03-15 DIAGNOSIS — G4700 Insomnia, unspecified: Secondary | ICD-10-CM

## 2021-03-15 DIAGNOSIS — F324 Major depressive disorder, single episode, in partial remission: Secondary | ICD-10-CM | POA: Diagnosis not present

## 2021-03-15 DIAGNOSIS — H9192 Unspecified hearing loss, left ear: Secondary | ICD-10-CM | POA: Diagnosis not present

## 2021-03-15 DIAGNOSIS — Z23 Encounter for immunization: Secondary | ICD-10-CM

## 2021-03-15 DIAGNOSIS — H6982 Other specified disorders of Eustachian tube, left ear: Secondary | ICD-10-CM | POA: Diagnosis not present

## 2021-03-15 DIAGNOSIS — J301 Allergic rhinitis due to pollen: Secondary | ICD-10-CM

## 2021-03-15 HISTORY — DX: Nasal congestion: R09.81

## 2021-03-15 MED ORDER — FLUTICASONE PROPIONATE 50 MCG/ACT NA SUSP
2.0000 | Freq: Every day | NASAL | 6 refills | Status: DC
  Filled 2021-03-15: qty 16, 30d supply, fill #0

## 2021-03-15 MED ORDER — PREDNISONE 10 MG (48) PO TBPK
ORAL_TABLET | ORAL | 0 refills | Status: DC
  Filled 2021-03-15: qty 48, 12d supply, fill #0

## 2021-03-15 NOTE — Progress Notes (Signed)
Established Patient Office Visit  Subjective:  Patient ID: Robert Bowen, male    DOB: January 07, 1956  Age: 65 y.o. MRN: [id number]  CC:  Chief Complaint  Patient presents with   Follow-up    6 month follow, no concerns.     HPI Robert Bowen presents for follow-up of ongoing intermittent head congestion and hearing decline in the left ear.  Robert Bowen had previously complained about a frontal headache but that has resolved.  Again Robert Bowen complains of fairly rapid decline in hearing in the left ear.  Robert Bowen has been using Flonase daily for a few months without relief.  At this time Robert Bowen denies rhinorrhea or postnasal drip fever chills facial pressure or teeth pain.  Says that his head feels congested across the top from time to time.  Diabetes is well controlled.  Robert Bowen has no problems taking prednisone.  Depression is well controlled with citalopram.  Trazodone has been most helpful with sleep.  Robert Bowen is able to sleep throughout the night without problems.  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 Added automatically from request for surgery [id number]   Ruptured globe of left eye 12/31/2019   Formatting of this note might be different from the original. 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 Last Assessment & Plan:  Start Meloxicam. Supportive measures reviewed. If not improving we will need further assessment.   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 Added automatically from request  for surgery [id number]  Formatting of this note might be different from the original. Added automatically from request for surgery [id number]    Past Surgical History:  Procedure Laterality Date   EYE SURGERY      Family History  Problem Relation  Age of Onset   Diabetes type II Father    Heart disease Father    Liver cancer Mother     Social History   Socioeconomic History   Marital status: Married    Spouse name: Not on file   Number of children: Not on file   Years of education: Not on file   Highest education level: Not on file  Occupational History   Not on file  Tobacco Use   Smoking status: Never   Smokeless tobacco: Never  Vaping Use   Vaping Use: Never used  Substance and Sexual Activity   Alcohol use: Yes    Comment: just on occassion, rarely   Drug use: No   Sexual activity: Yes  Other Topics Concern   Not on file  Social History Narrative   Part time employment.   Non-denominational pastor.   Social Determinants of Health   Financial Resource Strain: Low Risk    Difficulty of Paying Living Expenses: Not hard at all  Food Insecurity: No Food Insecurity   Worried About Charity fundraiser in the Last Year: Never true   [place] in the Last Year: Never true  Transportation Needs: No Transportation Needs   Lack of Transportation (Medical): No   Lack of Transportation (Non-Medical): No  Physical Activity: Inactive   Days of Exercise per Week: 0 days   Minutes of Exercise per Session: 0 min  Stress: No Stress Concern Present   Feeling of Stress : Not at all  Social Connections: Socially Integrated   Frequency of Communication with Friends and Family: Twice a week   Frequency of Social Gatherings with Friends and Family: Once a week   Attends Religious Services: More than 4 times per year   Active Member of [employer] or Organizations: Yes   Attends Archivist Meetings: Never   Marital Status: Married  Human resources officer Violence: Not At Risk   Fear of Current or Ex-Partner: No   Emotionally Abused: No   Physically Abused: No   Sexually Abused: No    Outpatient Medications Prior to Visit  Medication Sig Dispense Refill   atorvastatin (LIPITOR) 20 MG tablet TAKE 1 TABLET (20 MG  TOTAL) BY MOUTH DAILY. MUST MAKE APPT FOR REFILLS 90 tablet 3   brimonidine (ALPHAGAN) 0.2 % ophthalmic solution Place 1 drop into the left eye 2 times daily. 15 mL 11   citalopram (CELEXA) 40 MG tablet TAKE 1 TABLET BY MOUTH ONCE A DAY 90 tablet 0   dapagliflozin propanediol (FARXIGA) 10 MG TABS tablet Take 1 tablet (10 mg total) by mouth daily. 90 tablet 3   dorzolamide-timolol (COSOPT) 22.3-6.8 MG/ML ophthalmic solution PLACE 1 DROP IN THE LEFT EYE 2 TIMES DAILY 30 mL 3   glipiZIDE (GLUCOTROL) 5 MG tablet Take 1 tablet (5 mg total) by mouth daily before breakfast. 90 tablet 3   losartan (COZAAR) 25 MG tablet TAKE 1 TABLET BY MOUTH ONCE DAILY 100 tablet 1   metFORMIN (GLUCOPHAGE-XR) 750 MG 24 hr tablet Take 1 tablet by mouth 2  times daily with a meal. 180 tablet 3   Multiple Vitamin (MULTIVITAMIN) tablet Take 1 tablet by mouth daily.     prednisoLONE acetate (  PRED FORTE) 1 % ophthalmic suspension Place 1 drop into both eyes in the morning and at bedtime. 15 mL 11   traZODone (DESYREL) 50 MG tablet TAKE 1 TABLET BY MOUTH AT BEDTIME 90 tablet 1   valACYclovir (VALTREX) 1000 MG tablet Take 1 tablet by mouth once daily. 90 tablet 5   VITAMIN B COMPLEX-C PO Take 1 capsule by mouth daily.     fluticasone (FLONASE) 50 MCG/ACT nasal spray Place 2 sprays into both nostrils daily. 16 g 6   No facility-administered medications prior to visit.    No Known Allergies  ROS Review of Systems  Constitutional:  Negative for chills, diaphoresis, fatigue, fever and unexpected weight change.  HENT:  Positive for congestion and hearing loss. Negative for ear discharge, ear pain, mouth sores, nosebleeds, postnasal drip, rhinorrhea, sinus pressure, sinus pain, sneezing, sore throat, tinnitus and trouble swallowing.   Eyes:  Positive for visual disturbance. Negative for photophobia.  Respiratory:  Negative for cough, shortness of breath and wheezing.   Cardiovascular: Negative.  Negative for chest pain and  palpitations.  Gastrointestinal: Negative.   Allergic/Immunologic: Negative for immunocompromised state.  Neurological:  Negative for dizziness, facial asymmetry, light-headedness and headaches.  Psychiatric/Behavioral:  Negative for dysphoric mood and sleep disturbance. The patient is not nervous/anxious.      Objective:    Physical Exam Vitals and nursing note reviewed.  Constitutional:      General: Robert Bowen is not in acute distress.    Appearance: Normal appearance. Robert Bowen is normal weight. Robert Bowen is not ill-appearing, toxic-appearing or diaphoretic.  HENT:     Head: Normocephalic and atraumatic.     Right Ear: Tympanic membrane, ear canal and external ear normal.     Left Ear: Tympanic membrane, ear canal and external ear normal.     Ears:     Comments: Instructed patient to clear eustachian tubes and Robert Bowen was unable to do so on the left side.    Mouth/Throat:     Mouth: Mucous membranes are moist.     Pharynx: Oropharynx is clear. No oropharyngeal exudate or posterior oropharyngeal erythema.  Eyes:     General: No scleral icterus.       Right eye: No discharge.        Left eye: No discharge.     Conjunctiva/sclera: Conjunctivae normal.  Cardiovascular:     Rate and Rhythm: Normal rate and regular rhythm.  Pulmonary:     Effort: Pulmonary effort is normal.     Breath sounds: Normal breath sounds.  Abdominal:     General: Bowel sounds are normal.  Musculoskeletal:     Cervical back: No rigidity or tenderness.  Lymphadenopathy:     Cervical: No cervical adenopathy.  Skin:    General: Skin is warm and dry.  Neurological:     Mental Status: Robert Bowen is alert and oriented to person, place, and time.  Psychiatric:        Mood and Affect: Mood normal.        Behavior: Behavior normal.    BP 124/70 (BP Location: Left Arm, Patient Position: Sitting, Cuff Size: Large)   Pulse (!) 55   Temp 97.6 F (36.4 C) (Temporal)   Ht 6' (1.829 m)   Wt 188 lb 3.2 oz (85.4 kg)   SpO2 97%   BMI 25.52  kg/m  Wt Readings from Last 3 Encounters:  03/15/21 188 lb 3.2 oz (85.4 kg)  02/22/21 187 lb 0.6 oz (84.8 kg)  02/15/21 181 lb 6.4  oz (82.3 kg)     Health Maintenance Due  Topic Date Due   Hepatitis C Screening  Never done   COLONOSCOPY (Pts 45-25yrs Insurance coverage will need to be confirmed)  Never done   Zoster Vaccines- Shingrix (1 of 2) Never done    There are no preventive care reminders to display for this patient.  Lab Results  Component Value Date   TSH 1.42 01/27/2021   Lab Results  Component Value Date   WBC 5.6 01/27/2021   HGB 14.3 01/27/2021   HCT 40.2 01/27/2021   MCV 88.9 01/27/2021   PLT 92.0 (L) 01/27/2021   Lab Results  Component Value Date   NA 136 01/27/2021   K 4.2 01/27/2021   CO2 31 01/27/2021   GLUCOSE 204 (H) 01/27/2021   BUN 11 01/27/2021   CREATININE 0.96 01/27/2021   BILITOT 1.1 01/27/2021   ALKPHOS 65 01/27/2021   AST 21 01/27/2021   ALT 24 01/27/2021   PROT 7.2 01/27/2021   ALBUMIN 4.6 01/27/2021   CALCIUM 9.4 01/27/2021   ANIONGAP 7 11/19/2020   GFR 82.55 01/27/2021   Lab Results  Component Value Date   CHOL 111 09/13/2020   Lab Results  Component Value Date   HDL 32.50 (L) 09/13/2020   Lab Results  Component Value Date   LDLCALC 56 09/13/2020   Lab Results  Component Value Date   TRIG 109.0 09/13/2020   Lab Results  Component Value Date   CHOLHDL 3 09/13/2020   Lab Results  Component Value Date   HGBA1C 6.8 (A) 11/30/2020      Assessment & Plan:   Problem List Items Addressed This Visit       Respiratory   Head congestion   Relevant Medications   predniSONE (STERAPRED UNI-PAK 48 TAB) 10 MG (48) TBPK tablet   Other Relevant Orders   Ambulatory referral to ENT     Nervous and Auditory   Dysfunction of left eustachian tube - Primary   Relevant Medications   predniSONE (STERAPRED UNI-PAK 48 TAB) 10 MG (48) TBPK tablet   fluticasone (FLONASE) 50 MCG/ACT nasal spray   Other Relevant Orders    Ambulatory referral to ENT     Other   Depression, major, single episode, in partial remission ([place])   Other Visit Diagnoses     Hearing loss of left ear, unspecified hearing loss type       Relevant Orders   Ambulatory referral to ENT   Insomnia, unspecified type       Allergic rhinitis due to pollen, unspecified seasonality       Relevant Medications   predniSONE (STERAPRED UNI-PAK 48 TAB) 10 MG (48) TBPK tablet   fluticasone (FLONASE) 50 MCG/ACT nasal spray   Need for pneumococcal vaccination       Relevant Orders   Pneumococcal conjugate vaccine 20-valent (Prevnar 20) (Completed)       Meds ordered this encounter  Medications   predniSONE (STERAPRED UNI-PAK 48 TAB) 10 MG (48) TBPK tablet    Sig: Pharm instruct 12 day taper please.    Dispense:  48 tablet    Refill:  0   fluticasone (FLONASE) 50 MCG/ACT nasal spray    Sig: Place 2 sprays into both nostrils daily.    Dispense:  16 g    Refill:  6    Follow-up: Return in about 6 months (around 09/13/2021).   Information was given on hearing loss and eustachian tube dysfunction.  Seeking consultation with ear  nose and throat. Libby Maw, MD

## 2021-03-17 ENCOUNTER — Encounter: Payer: Self-pay | Admitting: Gastroenterology

## 2021-03-29 ENCOUNTER — Ambulatory Visit: Payer: Medicare Other | Admitting: Family Medicine

## 2021-04-05 ENCOUNTER — Other Ambulatory Visit (HOSPITAL_COMMUNITY): Payer: Self-pay

## 2021-04-11 ENCOUNTER — Telehealth: Payer: Self-pay | Admitting: *Deleted

## 2021-04-11 NOTE — Telephone Encounter (Signed)
Please evaluate chart and let me know if pt. Can be done in [facility],seen reactive airway in chart but not in anesthesia reports.

## 2021-04-13 ENCOUNTER — Other Ambulatory Visit (HOSPITAL_COMMUNITY): Payer: Self-pay

## 2021-04-18 ENCOUNTER — Other Ambulatory Visit (HOSPITAL_COMMUNITY): Payer: Self-pay

## 2021-04-21 ENCOUNTER — Other Ambulatory Visit: Payer: Self-pay

## 2021-04-21 ENCOUNTER — Other Ambulatory Visit (HOSPITAL_COMMUNITY): Payer: Self-pay

## 2021-04-21 ENCOUNTER — Ambulatory Visit (AMBULATORY_SURGERY_CENTER): Payer: Medicare Other

## 2021-04-21 VITALS — Ht 72.0 in | Wt 180.0 lb

## 2021-04-21 DIAGNOSIS — Z1211 Encounter for screening for malignant neoplasm of colon: Secondary | ICD-10-CM

## 2021-04-21 MED ORDER — NA SULFATE-K SULFATE-MG SULF 17.5-3.13-1.6 GM/177ML PO SOLN
1.0000 | Freq: Once | ORAL | 0 refills | Status: AC
  Filled 2021-04-21: qty 354, 1d supply, fill #0

## 2021-04-21 NOTE — Progress Notes (Signed)
No egg or soy allergy known to patient  No issues known to pt with past sedation with any surgeries or procedures Patient denies ever being told they had issues or difficulty with intubation  No FH of Malignant Hyperthermia Pt is not on diet pills Pt is not on home 02  Pt is not on blood thinners  Pt denies issues with constipation;  No A fib or A flutter Pt is fully vaccinated for Covid x 2; NO PA's for preps discussed with pt in PV today  Discussed with pt there will be an out-of-pocket cost for prep and that varies from $0 to 70 + dollars - pt verbalized understanding  Due to the COVID-19 pandemic we are asking patients to follow certain guidelines in PV and the [place]   Pt aware of COVID protocols and [facility] guidelines  PV completed over the phone. Pt verified name, DOB, address and insurance during PV today.  Pt mailed instruction packet with copy of consent form to read and not return, and instructions.  Pt encouraged to call with questions or issues.  If pt has My chart, procedure instructions sent via My Chart

## 2021-04-25 ENCOUNTER — Other Ambulatory Visit (HOSPITAL_COMMUNITY): Payer: Self-pay

## 2021-04-27 ENCOUNTER — Other Ambulatory Visit (HOSPITAL_COMMUNITY): Payer: Self-pay

## 2021-04-27 MED ORDER — AMOXICILLIN 500 MG PO CAPS
ORAL_CAPSULE | ORAL | 0 refills | Status: DC
  Filled 2021-04-27: qty 21, 7d supply, fill #0

## 2021-04-28 ENCOUNTER — Encounter: Payer: Self-pay | Admitting: Gastroenterology

## 2021-05-05 ENCOUNTER — Ambulatory Visit (AMBULATORY_SURGERY_CENTER): Payer: Medicare Other | Admitting: Gastroenterology

## 2021-05-05 ENCOUNTER — Encounter: Payer: Self-pay | Admitting: Gastroenterology

## 2021-05-05 ENCOUNTER — Other Ambulatory Visit: Payer: Self-pay

## 2021-05-05 VITALS — BP 135/76 | HR 54 | Temp 96.2°F | Resp 11 | Ht 72.0 in | Wt 180.0 lb

## 2021-05-05 DIAGNOSIS — D120 Benign neoplasm of cecum: Secondary | ICD-10-CM

## 2021-05-05 DIAGNOSIS — D123 Benign neoplasm of transverse colon: Secondary | ICD-10-CM | POA: Diagnosis not present

## 2021-05-05 DIAGNOSIS — Z1211 Encounter for screening for malignant neoplasm of colon: Secondary | ICD-10-CM | POA: Diagnosis not present

## 2021-05-05 DIAGNOSIS — D122 Benign neoplasm of ascending colon: Secondary | ICD-10-CM | POA: Diagnosis not present

## 2021-05-05 MED ORDER — SODIUM CHLORIDE 0.9 % IV SOLN: 500.0000 mL | Freq: Once | INTRAVENOUS | Status: DC

## 2021-05-05 NOTE — Patient Instructions (Signed)
Handout on polyps provided.  Await pathology results.  YOU HAD AN ENDOSCOPIC PROCEDURE TODAY AT [facility]:   Refer to the procedure report that was given to you for any specific questions about what was found during the examination.  If the procedure report does not answer your questions, please call your gastroenterologist to clarify.  If you requested that your care partner not be given the details of your procedure findings, then the procedure report has been included in a sealed envelope for you to review at your convenience later.  YOU SHOULD EXPECT: Some feelings of bloating in the abdomen. Passage of more gas than usual.  Walking can help get rid of the air that was put into your GI tract during the procedure and reduce the bloating. If you had a lower endoscopy (such as a colonoscopy or flexible sigmoidoscopy) you may notice spotting of blood in your stool or on the toilet paper. If you underwent a bowel prep for your procedure, you may not have a normal bowel movement for a few days.  Please Note:  You might notice some irritation and congestion in your nose or some drainage.  This is from the oxygen used during your procedure.  There is no need for concern and it should clear up in a day or so.  SYMPTOMS TO REPORT IMMEDIATELY:  Following lower endoscopy (colonoscopy or flexible sigmoidoscopy):  Excessive amounts of blood in the stool  Significant tenderness or worsening of abdominal pains  Swelling of the abdomen that is new, acute  Fever of 100F or higher  For urgent or emergent issues, a gastroenterologist can be reached at any hour by calling [phone]. Do not use MyChart messaging for urgent concerns.    DIET:  We do recommend a small meal at first, but then you may proceed to your regular diet.  Drink plenty of fluids but you should avoid alcoholic beverages for 24 hours.  ACTIVITY:  You should plan to take it easy for the rest of today and you should  NOT DRIVE or use heavy machinery until tomorrow (because of the sedation medicines used during the test).    FOLLOW UP: Our staff will call the number listed on your records 48-72 hours following your procedure to check on you and address any questions or concerns that you may have regarding the information given to you following your procedure. If we do not reach you, we will leave a message.  We will attempt to reach you two times.  During this call, we will ask if you have developed any symptoms of COVID 19. If you develop any symptoms (ie: fever, flu-like symptoms, shortness of breath, cough etc.) before then, please call [phone].  If you test positive for Covid 19 in the 2 weeks post procedure, please call and report this information to [place].    If any biopsies were taken you will be contacted by phone or by letter within the next 1-3 weeks.  Please call us at [phone] if you have not heard about the biopsies in 3 weeks.    SIGNATURES/CONFIDENTIALITY: You and/or your care partner have signed paperwork which will be entered into your electronic medical record.  These signatures attest to the fact that that the information above on your After Visit Summary has been reviewed and is understood.  Full responsibility of the confidentiality of this discharge information lies with you and/or your care-partner.

## 2021-05-05 NOTE — Progress Notes (Signed)
Report given to PACU, vss 

## 2021-05-05 NOTE — Progress Notes (Signed)
History and Physical:  This patient presents for endoscopic testing for: Encounter Diagnosis  Name Primary?   Colon cancer screening Yes    Patient denies chronic abdominal pain, rectal bleeding, constipation or diarrhea. 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 Added automatically from  request for surgery [phone]   Ruptured globe of left eye 12/31/2019   Formatting of this note might be different from the original. 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 Last Assessment & Plan:  Start Meloxicam. Supportive measures reviewed. If not improving we will need further assessment.   Thrombocytopenia ([place]) 04/21/2019   Type 2 diabetes mellitus with hyperglycemia, without long-term current use of insulin ([place]) 06/26/2019   Type 2 diabetes mellitus with proliferative retinopathy of left eye, without long-term current use of insulin ([place]) 06/26/2019   Uncontrolled type 2 diabetes mellitus with hyperglycemia ([place]) 04/18/2019   Vitreous prolapse, left eye 08/12/2018   Formatting of this note might be different from the original. Added automatically from request for surgery [id number]  Formatting of this note might be different from the original. Added automatically from request for surgery [phone]     Past Surgical History: Past Surgical History:  Procedure Laterality Date   EYE SURGERY      Allergies: No Known Allergies  Outpatient Meds: Current Outpatient Medications  Medication Sig Dispense Refill   amoxicillin (AMOXIL) 500 MG capsule Take 1 capsule by mouth three times daily for 7 days. 21 capsule 0   atorvastatin (LIPITOR) 20 MG tablet TAKE 1 TABLET (20 MG TOTAL) BY MOUTH DAILY. MUST MAKE APPT FOR REFILLS 90 tablet 3   brimonidine (ALPHAGAN) 0.2 % ophthalmic  solution Place 1 drop into the left eye 2 times daily. 15 mL 11   citalopram (CELEXA) 40 MG tablet TAKE 1 TABLET BY MOUTH ONCE A DAY 90 tablet 0   dapagliflozin propanediol (FARXIGA) 10 MG TABS tablet Take 1  tablet (10 mg total) by mouth daily. 90 tablet 3   dorzolamide-timolol (COSOPT) 22.3-6.8 MG/ML ophthalmic solution PLACE 1 DROP IN THE LEFT EYE 2 TIMES DAILY 30 mL 3   fluticasone (FLONASE) 50 MCG/ACT nasal spray Place 2 sprays into both nostrils daily. 16 g 6   losartan (COZAAR) 25 MG tablet TAKE 1 TABLET BY MOUTH ONCE DAILY 100 tablet 1   metFORMIN (GLUCOPHAGE-XR) 750 MG 24 hr tablet Take 1 tablet by mouth 2  times daily with a meal. 180 tablet 3   Multiple Vitamin (MULTIVITAMIN) tablet Take 1 tablet by mouth daily.     prednisoLONE acetate (PRED FORTE) 1 % ophthalmic suspension Place 1 drop into both eyes in the morning and at bedtime. 15 mL 11   traZODone (DESYREL) 50 MG tablet TAKE 1 TABLET BY MOUTH AT BEDTIME 90 tablet 1   valACYclovir (VALTREX) 1000 MG tablet Take 1 tablet by mouth once daily. 90 tablet 5   Current Facility-Administered Medications  Medication Dose Route Frequency Provider Last Rate Last Admin   0.9 %  sodium chloride infusion  500 mL Intravenous Once Danis, Estill Cotta III, MD          ___________________________________________________________________ Objective   Exam:  BP (!) 153/77    Pulse (!) 57    Temp (!) 96.2 F (35.7 C) (Temporal)    Resp 15    Ht 6' (1.829 m)    Wt 180 lb (81.6 kg)    SpO2 100%    BMI 24.41 kg/m   CV: RRR without murmur, S1/S2 Resp: clear to auscultation bilaterally, normal RR and effort noted GI: soft, no tenderness, with active bowel sounds.   Assessment: Encounter Diagnosis  Name Primary?   Colon cancer screening Yes     Plan: Colonoscopy  The benefits and risks of the planned procedure were described in detail with the patient or (when appropriate) their health care proxy.  Risks were outlined as including, but not limited to, bleeding, infection, perforation, adverse medication reaction leading to cardiac or pulmonary decompensation, pancreatitis (if ERCP).  The limitation of incomplete mucosal visualization was also  discussed.  No guarantees or warranties were given.    The patient is appropriate for an endoscopic procedure in the ambulatory setting.   - Wilfrid Lund, MD

## 2021-05-05 NOTE — Progress Notes (Signed)
Called to room to assist during endoscopic procedure.  Patient ID and intended procedure confirmed with present staff. Received instructions for my participation in the procedure from the performing physician.  

## 2021-05-05 NOTE — Progress Notes (Signed)
Pt's states no medical or surgical changes since previsit or office visit.   VS [place]

## 2021-05-05 NOTE — Op Note (Signed)
Robert Bowen: Robert Bowen Procedure Date: 05/05/2021 11:51 AM MRN: [id number] Endoscopist: [place]. Loletha Carrow , MD Age: 66 Referring MD:  Date of Birth: [birth year] Gender: Male Account #: [id number] Procedure:                Colonoscopy Indications:              Screening for colorectal malignant neoplasm, This                            is the patient's first colonoscopy Medicines:                Monitored Anesthesia Care Procedure:                Pre-Anesthesia Assessment:                           - Prior to the procedure, a History and Physical                            was performed, and patient medications and                            allergies were reviewed. The patient's tolerance of                            previous anesthesia was also reviewed. The risks                            and benefits of the procedure and the sedation                            options and risks were discussed with the patient.                            All questions were answered, and informed consent                            was obtained. Prior Anticoagulants: The patient has                            taken no previous anticoagulant or antiplatelet                            agents. ASA Grade Assessment: II - A patient with                            mild systemic disease. After reviewing the risks                            and benefits, the patient was deemed in                            satisfactory condition to undergo the procedure.  After obtaining informed consent, the colonoscope                            was passed under direct vision. Throughout the                            procedure, the patient's blood pressure, pulse, and                            oxygen saturations were monitored continuously. The                            CF [id number] #[id number] was introduced through the anus                            and advanced to the the  cecum, identified by                            appendiceal orifice and ileocecal valve. The                            colonoscopy was performed with moderate difficulty                            due to a redundant colon and significant looping.                            Successful completion of the procedure was aided by                            using manual pressure and straightening and                            shortening the scope to obtain bowel loop                            reduction. The patient tolerated the procedure                            well. The quality of the bowel preparation was                            good. The ileocecal valve, appendiceal orifice, and                            rectum were photographed. Scope In: 11:58:40 AM Scope Out: 12:27:13 PM Scope Withdrawal Time: 0 hours 21 minutes 9 seconds  Total Procedure Duration: 0 hours 28 minutes 33 seconds  Findings:                 The perianal and digital rectal examinations were                            normal.  Repeat examination of right colon under NBI                            performed.                           Two flat polyps were found in the proximal                            ascending colon and cecum. The polyps were                            diminutive in size. These polyps were removed with                            a cold biopsy forceps. Resection and retrieval were                            complete.                           Three semi-sessile polyps were found in the                            proximal transverse colon. The polyps were 2 to 5                            mm in size. These polyps were removed with a cold                            snare. Resection and retrieval were complete.                           The colon (entire examined portion) was redundant.                           The exam was otherwise without abnormality on                             direct and retroflexion views. Complications:            No immediate complications. Estimated Blood Loss:     Estimated blood loss was minimal. Impression:               - Two diminutive polyps in the proximal ascending                            colon and in the cecum, removed with a cold biopsy                            forceps. Resected and retrieved.                           - Three 2 to 5 mm polyps in the proximal transverse  colon, removed with a cold snare. Resected and                            retrieved.                           - Redundant colon.                           - The examination was otherwise normal on direct                            and retroflexion views. Recommendation:           - Patient has a contact number available for                            emergencies. The signs and symptoms of potential                            delayed complications were discussed with the                            patient. Return to normal activities tomorrow.                            Written discharge instructions were provided to the                            patient.                           - Resume previous diet.                           - Continue present medications.                           - Await pathology results.                           - Repeat colonoscopy is recommended for                            surveillance. The colonoscopy date will be                            determined after pathology results from today's                            exam become available for review. Carmela Piechowski L. Loletha Carrow, MD 05/05/2021 12:31:57 PM This report has been signed electronically.

## 2021-05-09 ENCOUNTER — Telehealth: Payer: Self-pay | Admitting: *Deleted

## 2021-05-09 NOTE — Telephone Encounter (Signed)
°  Follow up Call-  Call back number 05/05/2021  Post procedure Call Back phone  # [phone]  Permission to leave phone message Yes  Some recent data might be hidden     Patient questions:  Do you have a fever, pain , or abdominal swelling? No. Pain Score  0 *  Have you tolerated food without any problems? Yes.    Have you been able to return to your normal activities? Yes.    Do you have any questions about your discharge instructions: Diet   No. Medications  No. Follow up visit  No.  Do you have questions or concerns about your Care? No.  Actions: * If pain score is 4 or above: No action needed, pain <4.

## 2021-05-10 ENCOUNTER — Encounter: Payer: Self-pay | Admitting: Gastroenterology

## 2021-05-14 ENCOUNTER — Other Ambulatory Visit (HOSPITAL_COMMUNITY): Payer: Self-pay

## 2021-05-16 ENCOUNTER — Other Ambulatory Visit (HOSPITAL_COMMUNITY): Payer: Self-pay

## 2021-05-23 DIAGNOSIS — D123 Benign neoplasm of transverse colon: Secondary | ICD-10-CM | POA: Diagnosis not present

## 2021-05-23 DIAGNOSIS — D122 Benign neoplasm of ascending colon: Secondary | ICD-10-CM | POA: Diagnosis not present

## 2021-05-31 ENCOUNTER — Encounter: Payer: Self-pay | Admitting: Internal Medicine

## 2021-05-31 ENCOUNTER — Ambulatory Visit: Payer: Medicare Other | Admitting: Internal Medicine

## 2021-05-31 ENCOUNTER — Other Ambulatory Visit (HOSPITAL_COMMUNITY): Payer: Self-pay

## 2021-05-31 VITALS — BP 120/72 | HR 82 | Ht 72.0 in | Wt 177.8 lb

## 2021-05-31 DIAGNOSIS — E1139 Type 2 diabetes mellitus with other diabetic ophthalmic complication: Secondary | ICD-10-CM

## 2021-05-31 DIAGNOSIS — Z794 Long term (current) use of insulin: Secondary | ICD-10-CM | POA: Diagnosis not present

## 2021-05-31 LAB — POCT GLUCOSE (DEVICE FOR HOME USE): Glucose Fasting, POC: 97 mg/dL (ref 70–99)

## 2021-05-31 LAB — POCT GLYCOSYLATED HEMOGLOBIN (HGB A1C): Hemoglobin A1C: 6.5 % — AB (ref 4.0–5.6)

## 2021-05-31 MED ORDER — DAPAGLIFLOZIN PROPANEDIOL 10 MG PO TABS
10.0000 mg | ORAL_TABLET | Freq: Every day | ORAL | 3 refills | Status: DC
  Filled 2021-05-31 – 2022-04-24 (×2): qty 90, 90d supply, fill #0

## 2021-05-31 MED ORDER — FREESTYLE LIBRE 2 SENSOR MISC
1.0000 | 3 refills | Status: DC
  Filled 2021-05-31: qty 6, 84d supply, fill #0
  Filled 2021-09-02: qty 6, 84d supply, fill #1
  Filled 2021-09-05: qty 1, 14d supply, fill #1
  Filled 2021-09-05: qty 5, 70d supply, fill #1
  Filled 2021-12-19: qty 6, 84d supply, fill #2
  Filled 2022-03-30: qty 6, 84d supply, fill #3

## 2021-05-31 MED ORDER — METFORMIN HCL ER 750 MG PO TB24
750.0000 mg | ORAL_TABLET | Freq: Two times a day (BID) | ORAL | 3 refills | Status: DC
  Filled 2021-05-31 – 2021-08-05 (×2): qty 180, 90d supply, fill #0

## 2021-05-31 NOTE — Progress Notes (Signed)
Name: Robert Bowen  Age/ Sex: 66 y.o., male   MRN/ DOB: [id number], 05/12/1955     PCP: Libby Maw, MD   Reason for Endocrinology Evaluation: Type 2 Diabetes Mellitus  Initial Endocrine Consultative Visit: 06/26/2019    PATIENT IDENTIFIER: Robert Bowen is a 66 y.o. male with a past medical history of T2DM, HTN and Congenital right eye blindness. The patient has followed with Endocrinology clinic since 06/26/2019 for consultative assistance with management of his diabetes.  DIABETIC HISTORY:  Robert Bowen was diagnosed with T2DM in 1988. He was on glyburide in the past. He has not bee on insulin  His hemoglobin A1c has ranged from 6.6% in 2018, peaking at 11.0%in 2020.   On his initial visit to our clinic , his A1c was 7.4% , he was on metformin and Farxiga ( through pt assistance program), we added glipizide  SUBJECTIVE:   During the last visit (11/30/2020): A1c 6.8 %. Continued glipizide,  metformin and farxiga   Today (05/31/2021): Robert Bowen is here for a follow up on diabetes management. He checks his blood sugars occasionally . The patient has not  had hypoglycemic episodes since the last clinic visit.  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 , congenital right eye blindness , neuropathy  Denies: CKD Last Eye Exam: Completed 10/2020  Macrovascular complications:   Denies: CAD, CVA, PVD   HISTORY:  Past Medical History:  Past Medical History:  Diagnosis Date    Acute frontal sinusitis 02/09/2021   Anterior capsular phimosis of lens 06/17/2018   Anxiety and depression 03/02/2016   on meds   Bradycardia 01/27/2021   Childhood cataract, bilateral 01/11/2012   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   Controlled type 2 diabetes mellitus without complication, without long-term current use of insulin ([place]) 03/02/2016   Depression, major, single episode, in partial remission ([place]) 01/15/2018   Diabetes mellitus ([place]) 01/11/2012   Diabetes mellitus due to underlying condition with unspecified complications ([place]) 28/36/6294   Diabetes mellitus, type 2 ([place]) 1988   on meds   Diastasis recti 01/27/2021   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 Added automatically  from request for surgery [id number]   Ruptured globe of left eye 12/31/2019   Formatting of this note might be different from the original. 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 Last Assessment & Plan:  Start Meloxicam. Supportive measures reviewed. If not improving we will need further assessment.   Thrombocytopenia ([place]) 04/21/2019   Type 2 diabetes mellitus with hyperglycemia, without long-term current use of insulin ([place]) 06/26/2019   Type 2 diabetes mellitus with proliferative retinopathy of left eye, without long-term current use of insulin ([place]) 06/26/2019   Uncontrolled type 2 diabetes mellitus with hyperglycemia ([place]) 04/18/2019   Vitreous prolapse, left eye 08/12/2018   Formatting of this note might be different from the original. Added automatically from request for surgery [id number]  Formatting of this note might be different from the original. Added automatically from request for surgery [id number]   Past Surgical History:  Past Surgical History:  Procedure Laterality Date   EYE SURGERY     Social History:  reports that he has never smoked. He has never used smokeless tobacco. He reports current alcohol use of about 2.0 standard drinks per week. He reports that he does not use drugs. Family History:  Family History  Problem Relation Age of Onset   Liver cancer Mother    Diabetes type II Father    Heart disease Father    Colon cancer Neg Hx    Esophageal cancer Neg Hx    Stomach cancer Neg Hx    Rectal cancer Neg Hx    Colon polyps Neg Hx      HOME MEDICATIONS: Allergies as of 05/31/2021   No Known Allergies      Medication List        Accurate as of May 31, 2021  9:50 AM. If you have any questions, ask your nurse or doctor.          STOP taking these medications    amoxicillin 500 MG capsule Commonly known as: AMOXIL Stopped by: Dorita Sciara, MD       TAKE these medications    atorvastatin 20 MG tablet Commonly known as: LIPITOR TAKE 1 TABLET (20 MG TOTAL) BY MOUTH DAILY. MUST MAKE  APPT FOR REFILLS   brimonidine 0.2 % ophthalmic solution Commonly known as: ALPHAGAN Place 1 drop into the left eye 2 times daily.   citalopram 40 MG tablet Commonly known as: CELEXA TAKE 1 TABLET BY MOUTH ONCE A DAY   dapagliflozin propanediol 10 MG Tabs tablet Commonly known as: FARXIGA Take 1 tablet (10 mg total) by mouth daily.   fluticasone 50 MCG/ACT nasal spray Commonly known as: FLONASE Place 2 sprays into both nostrils daily.   FreeStyle Libre 2 Sensor Misc 1 Device by Does not apply route every 14 (fourteen) days. Started by: Dorita Sciara, MD   losartan 25 MG tablet Commonly known as: COZAAR TAKE 1 TABLET BY MOUTH ONCE DAILY   metFORMIN 750 MG 24 hr tablet Commonly known as: GLUCOPHAGE-XR Take 1 tablet by mouth 2  times daily with a meal.   multivitamin tablet Take 1 tablet by mouth daily.   prednisoLONE acetate 1 % ophthalmic suspension Commonly known as: PRED FORTE Place 1  drop into both eyes in the morning and at bedtime.   traZODone 50 MG tablet Commonly known as: DESYREL TAKE 1 TABLET BY MOUTH AT BEDTIME   valACYclovir 1000 MG tablet Commonly known as: VALTREX Take 1 tablet by mouth once daily.         OBJECTIVE:   Vital Signs: BP 120/72 (BP Location: Left Arm, Patient Position: Sitting, Cuff Size: Small)    Pulse 82    Ht 6' (1.829 m)    Wt 177 lb 12.8 oz (80.6 kg)    SpO2 98%    BMI 24.11 kg/m   Wt Readings from Last 3 Encounters:  05/31/21 177 lb 12.8 oz (80.6 kg)  05/05/21 180 lb (81.6 kg)  04/21/21 180 lb (81.6 kg)     Exam: General: Pt appears well and is in NAD  Lungs: Clear with good BS bilat with no rales, rhonchi, or wheezes  Heart: RRR with normal S1 and S2 and no gallops; no murmurs; no rub  Extremities: No pretibial edema. Plantar left foot surface is intact  Neuro: MS is good with appropriate affect, pt is alert and Ox3    DM foot exam: 05/31/2021   The skin of the feet is intact without sores or  ulcerations. The pedal pulses are 1+ on right and 1+ on left. The sensation is decreased to a screening 5.07, 10 gram monofilament on the right    DATA REVIEWED:  Lab Results  Component Value Date   HGBA1C 6.5 (A) 05/31/2021   HGBA1C 6.8 (A) 11/30/2020   HGBA1C 6.5 (A) 10/01/2019   Lab Results  Component Value Date   MICROALBUR <0.7 04/18/2019   LDLCALC 56 09/13/2020   CREATININE 0.96 01/27/2021   Lab Results  Component Value Date   MICRALBCREAT 1.4 04/18/2019     Lab Results  Component Value Date   CHOL 111 09/13/2020   HDL 32.50 (L) 09/13/2020   LDLCALC 56 09/13/2020   LDLDIRECT 97.0 05/10/2018   TRIG 109.0 09/13/2020   CHOLHDL 3 09/13/2020        Latest Reference Range & Units 01/27/21 10:47  Sodium 135 - 145 mEq/L 136  Potassium 3.5 - 5.1 mEq/L 4.2  Chloride 96 - 112 mEq/L 100  CO2 19 - 32 mEq/L 31  Glucose 70 - 99 mg/dL 204 (H)  BUN 6 - 23 mg/dL 11  Creatinine 0.40 - 1.50 mg/dL 0.96  Calcium 8.4 - 10.5 mg/dL 9.4  Alkaline Phosphatase 39 - 117 U/L 65  Albumin 3.5 - 5.2 g/dL 4.6  AST 0 - 37 U/L 21  ALT 0 - 53 U/L 24  Total Protein 6.0 - 8.3 g/dL 7.2  Total Bilirubin 0.2 - 1.2 mg/dL 1.1  GFR >60.00 mL/min 82.55     ASSESSMENT / PLAN / RECOMMENDATIONS:   1) Type 2 Diabetes Mellitus, Optimally controlled, With retinopathic complications - Most recent A1c of 6.5%. Goal A1c <7.0%.     - A1c continues to be at goal  - Pt tells me he in on Glipizide but in contacting pharmacy he has not pick it up since 12/2020. Will main off  - Pt would like freestyle libre , he understands this is dependent on his insurance   MEDICATIONS: -Not on  GlipiZide - Continue Metformin 750 mg Twice daily with meals - Continue Farxiga 10 mg daily with Breakfast     EDUCATION / INSTRUCTIONS: BG monitoring instructions: Patient is instructed to check his blood sugars 1 times a day, fasting  Call [facility] clinic if: BG persistently < 70 . I reviewed the Rule of 15  for the treatment of hypoglycemia in detail with the patient. Literature supplied.   2) Diabetic complications:  Eye: Does have known diabetic retinopathy.  Neuro/ Feet: Does not have known diabetic peripheral neuropathy .  Renal: Patient does not have known baseline CKD. He   is on an ACEI/ARB at present.      F/U in 6 months    Signed electronically by: Mack Guise, MD  [facility]  [address] [place], [place] [address] Phone: [phone] FAX: [phone]   CC: Libby Maw, MD [place] [address] Phone: [phone]  Fax: [phone]  Return to Endocrinology clinic as below: Future Appointments  Date Time Provider [place]  09/13/2021  8:30 AM Libby Maw, MD [facility]  11/29/2021  9:30 AM Zella Dewan, Melanie Crazier, MD [facility] None

## 2021-05-31 NOTE — Patient Instructions (Addendum)
-   Continue Glipizide 5 mg, 1 tablet before Breakfast  - Continue Metformin 750 mg Twice daily with meals - Continue Farxiga 10 mg daily with Breakfast     HOW TO TREAT LOW BLOOD SUGARS (Blood sugar LESS THAN 70 MG/DL) Please follow the RULE OF 15 for the treatment of hypoglycemia treatment (when your (blood sugars are less than 70 mg/dL)   STEP 1: Take 15 grams of carbohydrates when your blood sugar is low, which includes:  3-4 GLUCOSE TABS  OR 3-4 OZ OF JUICE OR REGULAR SODA OR ONE TUBE OF GLUCOSE GEL    STEP 2: RECHECK blood sugar in 15 MINUTES STEP 3: If your blood sugar is still low at the 15 minute recheck --> then, go back to STEP 1 and treat AGAIN with another 15 grams of carbohydrates.

## 2021-06-01 ENCOUNTER — Other Ambulatory Visit (HOSPITAL_COMMUNITY): Payer: Self-pay

## 2021-06-01 ENCOUNTER — Telehealth: Payer: Self-pay

## 2021-06-01 NOTE — Telephone Encounter (Signed)
Patient Advocate Encounter ?  ?Received notification from [facility] that prior authorization for Freestyle Libre 2 Sensor is required by his/her insurance [employer]. ?  ?PA submitted on 06/01/21 ? ?Key#: [id number] ? ?Status is pending ?   ?[facility] will continue to follow: ? ?Patient Advocate ?Fax: [phone]  ?

## 2021-06-02 ENCOUNTER — Other Ambulatory Visit (HOSPITAL_COMMUNITY): Payer: Self-pay

## 2021-06-02 NOTE — Telephone Encounter (Signed)
Patient Advocate Encounter ? ?Prior Authorization for [employer] 2 sensors has been approved.   ? ?PA# [id number] ? ?Effective dates: 06/01/21 through 04/02/22 ? ?Per Test Claim Patients co-pay is $0.  ? ?Spoke with Pharmacy to Process. ? ?Patient Advocate ?Fax: [phone]  ?

## 2021-06-03 ENCOUNTER — Other Ambulatory Visit (HOSPITAL_COMMUNITY): Payer: Self-pay

## 2021-06-03 ENCOUNTER — Other Ambulatory Visit: Payer: Self-pay | Admitting: Family Medicine

## 2021-06-03 DIAGNOSIS — G4700 Insomnia, unspecified: Secondary | ICD-10-CM

## 2021-06-03 MED ORDER — TRAZODONE HCL 50 MG PO TABS
ORAL_TABLET | Freq: Every day | ORAL | 1 refills | Status: DC
  Filled 2021-06-03: qty 90, 90d supply, fill #0
  Filled 2021-09-01: qty 90, 90d supply, fill #1

## 2021-06-15 ENCOUNTER — Other Ambulatory Visit: Payer: Self-pay | Admitting: Family Medicine

## 2021-06-15 ENCOUNTER — Other Ambulatory Visit (HOSPITAL_COMMUNITY): Payer: Self-pay

## 2021-06-15 DIAGNOSIS — F32A Depression, unspecified: Secondary | ICD-10-CM

## 2021-06-17 ENCOUNTER — Other Ambulatory Visit (HOSPITAL_COMMUNITY): Payer: Self-pay

## 2021-06-17 MED ORDER — CITALOPRAM HYDROBROMIDE 40 MG PO TABS
ORAL_TABLET | Freq: Every day | ORAL | 0 refills | Status: DC
  Filled 2021-06-17: qty 90, 90d supply, fill #0

## 2021-06-20 ENCOUNTER — Other Ambulatory Visit (HOSPITAL_COMMUNITY): Payer: Self-pay

## 2021-06-22 ENCOUNTER — Other Ambulatory Visit (HOSPITAL_COMMUNITY): Payer: Self-pay

## 2021-06-29 ENCOUNTER — Other Ambulatory Visit (HOSPITAL_COMMUNITY): Payer: Self-pay

## 2021-07-01 ENCOUNTER — Other Ambulatory Visit (HOSPITAL_COMMUNITY): Payer: Self-pay

## 2021-07-08 ENCOUNTER — Other Ambulatory Visit (HOSPITAL_COMMUNITY): Payer: Self-pay

## 2021-07-12 ENCOUNTER — Other Ambulatory Visit (HOSPITAL_COMMUNITY): Payer: Self-pay

## 2021-07-12 ENCOUNTER — Other Ambulatory Visit: Payer: Self-pay

## 2021-07-21 ENCOUNTER — Other Ambulatory Visit (HOSPITAL_COMMUNITY): Payer: Self-pay

## 2021-08-05 ENCOUNTER — Other Ambulatory Visit (HOSPITAL_COMMUNITY): Payer: Self-pay

## 2021-08-05 ENCOUNTER — Other Ambulatory Visit: Payer: Self-pay | Admitting: Family Medicine

## 2021-08-05 DIAGNOSIS — E1165 Type 2 diabetes mellitus with hyperglycemia: Secondary | ICD-10-CM

## 2021-08-05 MED ORDER — LOSARTAN POTASSIUM 25 MG PO TABS
ORAL_TABLET | Freq: Every day | ORAL | 2 refills | Status: DC
  Filled 2021-08-05: qty 90, 90d supply, fill #0
  Filled 2021-12-20: qty 90, 90d supply, fill #1
  Filled 2022-04-27: qty 90, 90d supply, fill #2

## 2021-08-09 ENCOUNTER — Other Ambulatory Visit (HOSPITAL_COMMUNITY): Payer: Self-pay

## 2021-08-09 MED ORDER — BRIMONIDINE TARTRATE 0.2 % OP SOLN
OPHTHALMIC | 11 refills | Status: DC
  Filled 2021-08-09: qty 5, 40d supply, fill #0

## 2021-08-09 MED ORDER — PREDNISOLONE ACETATE 1 % OP SUSP
OPHTHALMIC | 11 refills | Status: DC
  Filled 2021-08-09: qty 5, 20d supply, fill #0

## 2021-08-09 MED ORDER — DORZOLAMIDE HCL-TIMOLOL MAL 2-0.5 % OP SOLN
OPHTHALMIC | 11 refills | Status: AC
  Filled 2021-08-09: qty 10, 80d supply, fill #0

## 2021-08-09 MED ORDER — VALACYCLOVIR HCL 1 G PO TABS
1000.0000 mg | ORAL_TABLET | Freq: Every day | ORAL | 5 refills | Status: AC
  Filled 2021-08-09: qty 30, 30d supply, fill #0
  Filled 2022-03-07: qty 30, 30d supply, fill #1
  Filled 2022-04-27: qty 30, 30d supply, fill #2
  Filled 2022-06-23: qty 30, 30d supply, fill #3

## 2021-08-12 ENCOUNTER — Other Ambulatory Visit (HOSPITAL_COMMUNITY): Payer: Self-pay

## 2021-09-01 ENCOUNTER — Other Ambulatory Visit (HOSPITAL_COMMUNITY): Payer: Self-pay

## 2021-09-02 ENCOUNTER — Other Ambulatory Visit (HOSPITAL_COMMUNITY): Payer: Self-pay

## 2021-09-05 ENCOUNTER — Other Ambulatory Visit (HOSPITAL_COMMUNITY): Payer: Self-pay

## 2021-09-06 ENCOUNTER — Other Ambulatory Visit (HOSPITAL_COMMUNITY): Payer: Self-pay

## 2021-09-13 ENCOUNTER — Other Ambulatory Visit (HOSPITAL_COMMUNITY): Payer: Self-pay

## 2021-09-13 ENCOUNTER — Encounter: Payer: Self-pay | Admitting: Family Medicine

## 2021-09-13 ENCOUNTER — Ambulatory Visit (INDEPENDENT_AMBULATORY_CARE_PROVIDER_SITE_OTHER): Payer: Medicare Other | Admitting: Family Medicine

## 2021-09-13 VITALS — BP 122/70 | HR 63 | Temp 96.4°F | Ht 73.0 in | Wt 180.0 lb

## 2021-09-13 DIAGNOSIS — E782 Mixed hyperlipidemia: Secondary | ICD-10-CM

## 2021-09-13 DIAGNOSIS — Z125 Encounter for screening for malignant neoplasm of prostate: Secondary | ICD-10-CM | POA: Diagnosis not present

## 2021-09-13 DIAGNOSIS — F419 Anxiety disorder, unspecified: Secondary | ICD-10-CM

## 2021-09-13 DIAGNOSIS — G4700 Insomnia, unspecified: Secondary | ICD-10-CM

## 2021-09-13 DIAGNOSIS — F324 Major depressive disorder, single episode, in partial remission: Secondary | ICD-10-CM | POA: Diagnosis not present

## 2021-09-13 DIAGNOSIS — S161XXA Strain of muscle, fascia and tendon at neck level, initial encounter: Secondary | ICD-10-CM

## 2021-09-13 DIAGNOSIS — F32A Depression, unspecified: Secondary | ICD-10-CM

## 2021-09-13 HISTORY — DX: Insomnia, unspecified: G47.00

## 2021-09-13 LAB — LIPID PANEL
Cholesterol: 120 mg/dL (ref 0–200)
HDL: 39.5 mg/dL (ref >39.00)
LDL Cholesterol: 59 mg/dL (ref 0–99)
NonHDL: 80.26
Total CHOL/HDL Ratio: 3
Triglycerides: 106 mg/dL (ref 0.0–149.0)
VLDL: 21.2 mg/dL (ref 0.0–40.0)

## 2021-09-13 LAB — COMPREHENSIVE METABOLIC PANEL
ALT: 23 U/L (ref 0–53)
AST: 19 U/L (ref 0–37)
Albumin: 4.7 g/dL (ref 3.5–5.2)
Alkaline Phosphatase: 82 U/L (ref 39–117)
BUN: 13 mg/dL (ref 6–23)
CO2: 30 mEq/L (ref 19–32)
Calcium: 9.6 mg/dL (ref 8.4–10.5)
Chloride: 99 mEq/L (ref 96–112)
Creatinine, Ser: 0.97 mg/dL (ref 0.40–1.50)
GFR: 81.17 mL/min (ref >60.00)
Glucose, Bld: 174 mg/dL — ABNORMAL HIGH (ref 70–99)
Potassium: 4.3 mEq/L (ref 3.5–5.1)
Sodium: 136 mEq/L (ref 135–145)
Total Bilirubin: 1 mg/dL (ref 0.2–1.2)
Total Protein: 7.4 g/dL (ref 6.0–8.3)

## 2021-09-13 LAB — PSA: PSA: 1.13 ng/mL (ref 0.10–4.00)

## 2021-09-13 LAB — URINALYSIS, ROUTINE W REFLEX MICROSCOPIC
Bilirubin Urine: NEGATIVE
Hgb urine dipstick: NEGATIVE
Ketones, ur: NEGATIVE
Leukocytes,Ua: NEGATIVE
Nitrite: NEGATIVE
RBC / HPF: NONE SEEN (ref 0–?)
Specific Gravity, Urine: 1.01 (ref 1.000–1.030)
Total Protein, Urine: NEGATIVE
Urine Glucose: >=1000 — AB
Urobilinogen, UA: 1 (ref 0.0–1.0)
pH: 6.5 (ref 5.0–8.0)

## 2021-09-13 MED ORDER — METHOCARBAMOL 500 MG PO TABS
500.0000 mg | ORAL_TABLET | Freq: Three times a day (TID) | ORAL | 1 refills | Status: DC | PRN
  Filled 2021-09-13: qty 40, 14d supply, fill #0
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
## 2021-10-19 ENCOUNTER — Other Ambulatory Visit: Payer: Self-pay | Admitting: Family Medicine

## 2021-10-19 ENCOUNTER — Other Ambulatory Visit (HOSPITAL_COMMUNITY): Payer: Self-pay

## 2021-10-19 DIAGNOSIS — F419 Anxiety disorder, unspecified: Secondary | ICD-10-CM

## 2021-10-19 DIAGNOSIS — G4700 Insomnia, unspecified: Secondary | ICD-10-CM

## 2021-10-19 MED ORDER — CITALOPRAM HYDROBROMIDE 40 MG PO TABS
ORAL_TABLET | Freq: Every day | ORAL | 0 refills | Status: DC
  Filled 2021-10-19: qty 90, 90d supply, fill #0

## 2021-10-21 ENCOUNTER — Other Ambulatory Visit (HOSPITAL_COMMUNITY): Payer: Self-pay

## 2021-10-21 MED ORDER — TRAZODONE HCL 50 MG PO TABS
ORAL_TABLET | ORAL | 1 refills | Status: DC
Start: 2021-10-21 — End: 2022-04-27
  Filled 2021-10-21: qty 180, 90d supply, fill #0
  Filled 2022-01-28: qty 180, 90d supply, fill #1

## 2021-11-22 ENCOUNTER — Telehealth: Payer: Self-pay | Admitting: Family Medicine

## 2021-11-22 NOTE — Telephone Encounter (Signed)
Pt states he would like to transfer primary care from Dr. Abelino Derrick at [facility] to Dr. Esther Hardy at [facility].   Pt states he was previously a patient of Dr. Kathrin Ruddy when she was previously practicing in [place], [place].    Approve or Decline?    Please route to [facility] pool with a determination.

## 2021-11-29 ENCOUNTER — Ambulatory Visit: Payer: Medicare Other | Admitting: Internal Medicine

## 2021-11-29 ENCOUNTER — Encounter: Payer: Self-pay | Admitting: Internal Medicine

## 2021-11-29 ENCOUNTER — Other Ambulatory Visit (HOSPITAL_COMMUNITY): Payer: Self-pay

## 2021-11-29 VITALS — BP 102/68 | HR 60 | Ht 73.0 in | Wt 177.8 lb

## 2021-11-29 DIAGNOSIS — Z794 Long term (current) use of insulin: Secondary | ICD-10-CM

## 2021-11-29 DIAGNOSIS — E119 Type 2 diabetes mellitus without complications: Secondary | ICD-10-CM

## 2021-11-29 DIAGNOSIS — E1139 Type 2 diabetes mellitus with other diabetic ophthalmic complication: Secondary | ICD-10-CM | POA: Diagnosis not present

## 2021-11-29 LAB — POCT GLYCOSYLATED HEMOGLOBIN (HGB A1C): Hemoglobin A1C: 6.1 % — AB (ref 4.0–5.6)

## 2021-11-29 MED ORDER — METFORMIN HCL ER 750 MG PO TB24
750.0000 mg | ORAL_TABLET | Freq: Two times a day (BID) | ORAL | 3 refills | Status: DC
  Filled 2021-11-29: qty 180, 90d supply, fill #0
  Filled 2022-03-28: qty 180, 90d supply, fill #1

## 2021-11-29 NOTE — Patient Instructions (Signed)
-   STOP Glipizide - Continue Metformin 750 mg Twice daily with meals - Continue Farxiga 10 mg daily with Breakfast     HOW TO TREAT LOW BLOOD SUGARS (Blood sugar LESS THAN 70 MG/DL) Please follow the RULE OF 15 for the treatment of hypoglycemia treatment (when your (blood sugars are less than 70 mg/dL)   STEP 1: Take 15 grams of carbohydrates when your blood sugar is low, which includes:  3-4 GLUCOSE TABS  OR 3-4 OZ OF JUICE OR REGULAR SODA OR ONE TUBE OF GLUCOSE GEL    STEP 2: RECHECK blood sugar in 15 MINUTES STEP 3: If your blood sugar is still low at the 15 minute recheck --> then, go back to STEP 1 and treat AGAIN with another 15 grams of carbohydrates.

## 2021-11-29 NOTE — Progress Notes (Signed)
Name: Robert Bowen  Age/ Sex: 66 y.o., male   MRN/ DOB: [id number], 1955/06/12     PCP: Libby Maw, MD   Reason for Endocrinology Evaluation: Type 2 Diabetes Mellitus  Initial Endocrine Consultative Visit: 06/26/2019    PATIENT IDENTIFIER: Robert Bowen is a 66 y.o. male with a past medical history of T2DM, HTN and Congenital right eye blindness. The patient has followed with Endocrinology clinic since 06/26/2019 for consultative assistance with management of his diabetes.  DIABETIC HISTORY:  Mr. Robert Bowen was diagnosed with T2DM in 1988. He was on glyburide in the past. He has not bee on insulin  His hemoglobin A1c has ranged from 6.6% in 2018, peaking at 11.0%in 2020.   On his initial visit to our clinic , his A1c was 7.4% , he was on metformin and [place]ade ( through pt assistance program), we added glipizide   By his visit in February 2023 he had self discontinued glipizide with an A1c of 6.5%    SUBJECTIVE:   During the last visit (05/31/2021): A1c 6.5 %. Continued metformin and farxiga   Today (11/29/2021): Mr. Robert Bowen is here for a follow up on diabetes management. He checks his blood sugars occasionally . The patient has not  had hypoglycemic episodes since the last clinic visit.  He continues to use glipizide before meal when he remembers Have a 81 and 20 yr old grand kids living with them   Denies nausea or vomiting or diarrhea  Continues with abnormal sensation of the feet    HOME DIABETES REGIMEN:  Metformin 750 mg Twice daily with meal Farxiga 10 mg daily with Breakfast      Statin: yes ACE-I/ARB: yes    CONTINUOUS GLUCOSE MONITORING RECORD INTERPRETATION    Dates of Recording: 8/16-8/29/2023  Sensor description:freestyle  Results statistics:   CGM use % of time 56  Average and SD 150/22.8  Time in range   85     %  % Time Above 180 13  % Time above 250 2  % Time Below target 0       Glycemic patterns summary: BG's  optimal during the day and night   Hyperglycemic episodes  postprandial   Hypoglycemic episodes occurred n/a  Overnight periods: optimal       DIABETIC COMPLICATIONS: Microvascular complications:  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 , congenital right eye blindness , neuropathy  Denies: CKD Last Eye Exam: Completed 08/15/2021  Macrovascular complications:   Denies: CAD, CVA, PVD   HISTORY:  Past Medical History:  Past Medical History:  Diagnosis Date   Acute frontal sinusitis 02/09/2021   Anterior capsular phimosis of lens 06/17/2018   Anxiety and depression 03/02/2016   on meds   Bradycardia 01/27/2021   Childhood cataract, bilateral 01/11/2012   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   Controlled type 2 diabetes mellitus without complication, without long-term current use of insulin ([place]) 03/02/2016   Depression, major, single episode, in partial remission ([place]) 01/15/2018   Diabetes mellitus ([place]) 01/11/2012   Diabetes mellitus due to underlying condition with unspecified complications ([place]) 38/25/0539   Diabetes mellitus, type 2 ([place]) 1988   on meds   Diastasis recti 01/27/2021   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 Added automatically from request for surgery [id number]   Ruptured globe of left eye 12/31/2019   Formatting of this note might be different from the original. 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 Last Assessment & Plan:  Start Meloxicam. Supportive measures reviewed. If not improving we will need further assessment.   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 Added automatically from request for surgery [id number]  Formatting of this note might be different from the original. Added automatically from request for surgery [id number]   Past Surgical History:  Past Surgical History:  Procedure Laterality Date   EYE SURGERY     Social History:  reports that he has never smoked. He has never used smokeless tobacco. He reports current alcohol use of about 2.0 standard drinks of alcohol per week. He reports that he does not use drugs. Family History:  Family History  Problem Relation Age of Onset   Liver cancer Mother    Diabetes type II Father    Heart disease Father    Colon cancer Neg Hx    Esophageal cancer Neg Hx    Stomach cancer Neg Hx    Rectal cancer Neg Hx     Colon polyps Neg Hx      HOME MEDICATIONS: Allergies as of 11/29/2021   No Known Allergies      Medication List        Accurate as of November 29, 2021  9:47 AM. If you have any questions, ask your nurse or doctor.          atorvastatin 20 MG tablet Commonly known as: LIPITOR TAKE 1 TABLET (20 MG TOTAL) BY MOUTH DAILY. MUST MAKE APPT FOR REFILLS   brimonidine 0.2 % ophthalmic solution Commonly known as: ALPHAGAN Place 1 drop into the left eye 2 times daily.   brimonidine 0.2 % ophthalmic solution Commonly known as: ALPHAGAN Place 1 drop into the left eye 2 times daily.   citalopram 40 MG tablet Commonly known as: CELEXA TAKE 1 TABLET BY MOUTH ONCE A DAY   dapagliflozin propanediol 10 MG Tabs tablet Commonly known as: FARXIGA Take 1 tablet (10 mg total) by mouth daily.   dorzolamide-timolol 22.3-6.8 MG/ML ophthalmic solution Commonly known as: COSOPT Place 1 drop into the left  eye 2 times daily.   fluticasone 50 MCG/ACT nasal spray Commonly known as: FLONASE Place 2 sprays into both nostrils daily.   FreeStyle Libre 2 Sensor Misc Apply 1 sensor every 14 days.   losartan 25 MG tablet Commonly known as: COZAAR TAKE 1 TABLET BY MOUTH ONCE DAILY   metFORMIN 750 MG 24 hr tablet Commonly known as: GLUCOPHAGE-XR Take 1 tablet by mouth 2  times daily with a meal.   methocarbamol 500 MG tablet Commonly known as: ROBAXIN Take 1 tablet (500 mg total) by mouth every 8 (eight) hours as needed for muscle spasms.   multivitamin tablet Take 1 tablet by mouth daily.   prednisoLONE acetate 1 % ophthalmic suspension Commonly known as: PRED FORTE Place 1 drop into both eyes in the morning and at bedtime.   prednisoLONE acetate 1 % ophthalmic suspension Commonly known as: PRED FORTE Place 1 drop into both eyes in the morning and at bedtime.   traZODone 50 MG tablet Commonly known as: DESYREL Take 1 to 2 by mouth at night for sleep.   valACYclovir 1000 MG  tablet Commonly known as: VALTREX Take 1 tablet by mouth once daily.         OBJECTIVE:   Vital Signs: BP 102/68 (BP Location: Left Arm, Patient Position: Sitting, Cuff Size: Normal)   Pulse 60   Ht '6\' 1"'$  (1.854 m)   Wt 177 lb 12.8 oz (80.6 kg)   SpO2 96%   BMI 23.46 kg/m   Wt Readings from Last 3 Encounters:  11/29/21 177 lb 12.8 oz (80.6 kg)  09/13/21 180 lb (81.6 kg)  05/31/21 177 lb 12.8 oz (80.6 kg)     Exam: General: Pt appears well and is in NAD  Lungs: Clear with good BS bilat   Heart: RRR   Extremities: No pretibial edema.  Neuro: MS is good with appropriate affect, pt is alert and Ox3    DM foot exam: 11/29/2021   The skin of the feet is intact without sores or ulcerations. The pedal pulses are 1+ on right and 1+ on left. The sensation is decreased to a screening 5.07, 10 gram monofilament on the right    DATA REVIEWED:  Lab Results  Component Value Date   HGBA1C 6.5 (A) 05/31/2021   HGBA1C 6.8 (A) 11/30/2020   HGBA1C 6.5 (A) 10/01/2019   Lab Results  Component Value Date   MICROALBUR <0.7 04/18/2019   LDLCALC 59 09/13/2021   CREATININE 0.97 09/13/2021   Lab Results  Component Value Date   MICRALBCREAT 1.4 04/18/2019    Latest Reference Range & Units 09/13/21 09:30  Sodium 135 - 145 mEq/L 136  Potassium 3.5 - 5.1 mEq/L 4.3  Chloride 96 - 112 mEq/L 99  CO2 19 - 32 mEq/L 30  Glucose 70 - 99 mg/dL 174 (H)  BUN 6 - 23 mg/dL 13  Creatinine 0.40 - 1.50 mg/dL 0.97  Calcium 8.4 - 10.5 mg/dL 9.6  Alkaline Phosphatase 39 - 117 U/L 82  Albumin 3.5 - 5.2 g/dL 4.7  AST 0 - 37 U/L 19  ALT 0 - 53 U/L 23  Total Protein 6.0 - 8.3 g/dL 7.4  Total Bilirubin 0.2 - 1.2 mg/dL 1.0  GFR >60.00 mL/min 81.17  Total CHOL/HDL Ratio  3  Cholesterol 0 - 200 mg/dL 120  HDL Cholesterol >39.00 mg/dL 39.50  LDL (calc) 0 - 99 mg/dL 59  NonHDL  80.26  Triglycerides 0.0 - 149.0 mg/dL 106.0  VLDL 0.0 - 40.0 mg/dL  21.2       ASSESSMENT / PLAN / RECOMMENDATIONS:    1) Type 2 Diabetes Mellitus, Optimally controlled, With retinopathic complications - Most recent A1c of 6.1%. Goal A1c <7.0%.     - A1c continues to be at goal  -Patient tells me he takes glipizide when he remembers before meals, but when we verified pickup history from the pharmacy in the past the last pickup was September 2022, I have advised the patient to stop using glipizide  -He is on patient assistance program for Farxiga  MEDICATIONS: - STOP glipizide - Continue Metformin 750 mg Twice daily with meals - Continue Farxiga 10 mg daily with Breakfast     EDUCATION / INSTRUCTIONS: BG monitoring instructions: Patient is instructed to check his blood sugars 1 times a day, fasting  Call [facility] if: BG persistently < 70 . I reviewed the Rule of 15 for the treatment of hypoglycemia in detail with the patient. Literature supplied.   2) Diabetic complications:  Eye: Does have known diabetic retinopathy.  Neuro/ Feet: Does not have known diabetic peripheral neuropathy .  Renal: Patient does not have known baseline CKD. He   is on an ACEI/ARB at present.      F/U in 6 months    Signed electronically by: Mack Guise, MD  [facility]  [address] [place], [place] [address] Phone: [phone] FAX: [phone]   CC: Libby Maw, [place] [address] Phone: [phone]  Fax: [phone]  Return to Endocrinology clinic as below: Future Appointments  Date Time Provider [place]  12/02/2021 11:00 AM Tawnya Crook, MD [facility]  12/14/2021 10:00 AM Libby Maw, MD [facility]  02/01/2022  1:20 PM Revankar, Reita Cliche, MD [facility] None

## 2021-11-30 ENCOUNTER — Other Ambulatory Visit (HOSPITAL_COMMUNITY): Payer: Self-pay

## 2021-12-02 ENCOUNTER — Other Ambulatory Visit (HOSPITAL_COMMUNITY): Payer: Self-pay

## 2021-12-02 ENCOUNTER — Encounter: Payer: Self-pay | Admitting: Family Medicine

## 2021-12-02 ENCOUNTER — Ambulatory Visit (INDEPENDENT_AMBULATORY_CARE_PROVIDER_SITE_OTHER): Payer: Medicare Other | Admitting: Family Medicine

## 2021-12-02 VITALS — BP 120/60 | HR 46 | Temp 98.0°F | Ht 73.0 in | Wt 177.1 lb

## 2021-12-02 DIAGNOSIS — E113512 Type 2 diabetes mellitus with proliferative diabetic retinopathy with macular edema, left eye: Secondary | ICD-10-CM

## 2021-12-02 DIAGNOSIS — F324 Major depressive disorder, single episode, in partial remission: Secondary | ICD-10-CM | POA: Diagnosis not present

## 2021-12-02 DIAGNOSIS — M542 Cervicalgia: Secondary | ICD-10-CM | POA: Diagnosis not present

## 2021-12-02 NOTE — Progress Notes (Signed)
Subjective:     Patient ID: Robert Bowen, male    DOB: 04/07/1955, 66 y.o.   MRN: [id number]  Chief Complaint  Patient presents with   Transfer of Care    Stiffness in neck for a few months Fasting     HPI-TOC.  Here w/wife  DM type 2 w/neurop/retinop-seeing endo.  A1C 6.1.  some neuropathy.  Sees 3 ophth -cornea, glaucoma,retina q 6 mo.     Stiffness in neck for 60mo  Hurts to extend.  Dec rom turning.  No n/t.  No rad.  OTC not really needing, but annoying pain.   Depression-not ideal.  Daughter and 2 grands live w/them and granddau has some MH issues as well.  More moody, irrit.  No SI.  Had asked pcp to inc celexa from 20 to 40 but was told no. (Reviewed meds and 40 was sent in)  Health Maintenance Due  Topic Date Due   Hepatitis C Screening  Never done    Past Medical History:  Diagnosis Date   Acute frontal sinusitis 02/09/2021   Anterior capsular phimosis of lens 06/17/2018   Anxiety and depression 03/02/2016   on meds   Bradycardia 01/27/2021   Childhood cataract, bilateral 01/11/2012   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   Controlled type 2 diabetes mellitus without complication, without long-term current use of insulin ([facility] 03/02/2016   Depression, major, single episode, in partial remission ([facility] [place] 01/15/2018   Diabetes mellitus ([facility] 01/11/2012   Diabetes mellitus due to underlying condition with unspecified complications ([facility] 047/12/6281  Diabetes mellitus, type 2 ([facility] 1988   on meds   Diastasis recti 01/27/2021   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 Added automatically from request for surgery [id number]  Ruptured globe of left eye 12/31/2019   Formatting of this note might be different from the original. 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 Last Assessment & Plan:  Start Meloxicam. Supportive measures reviewed. If not improving we will need further assessment.   Thrombocytopenia ([facility] 04/21/2019   Type 2 diabetes mellitus with hyperglycemia, without long-term current use of insulin ([facility] 06/26/2019   Type 2 diabetes mellitus with proliferative retinopathy of left eye, without long-term current use of insulin ([facility] [place] 06/26/2019   Uncontrolled type 2 diabetes mellitus with hyperglycemia ([facility] 04/18/2019   Vitreous prolapse, left eye 08/12/2018   Formatting of this  note might be different from the original. Added automatically from request for surgery [id number]  Formatting of this note might be different from the original. Added automatically from request for surgery [id number]    Past Surgical History:  Procedure Laterality Date   EYE SURGERY Left    x4    Outpatient Medications Prior to Visit  Medication Sig Dispense Refill   atorvastatin (LIPITOR) 20 MG tablet TAKE 1 TABLET (20 MG TOTAL)  BY MOUTH DAILY. MUST MAKE APPT FOR REFILLS 90 tablet 3   brimonidine (ALPHAGAN) 0.2 % ophthalmic solution Place 1 drop into the left eye 2 times daily. 15 mL 11   brimonidine (ALPHAGAN) 0.2 % ophthalmic solution Place 1 drop into the left eye 2 times daily. 15 mL 11   citalopram (CELEXA) 40 MG tablet TAKE 1 TABLET BY MOUTH ONCE A DAY 90 tablet 0   Continuous Blood Gluc Sensor (FREESTYLE LIBRE 2 SENSOR) MISC Apply 1 sensor every 14 days. 6 each 3   dapagliflozin propanediol (FARXIGA) 10 MG TABS tablet Take 1 tablet (10 mg total) by mouth daily. 90 tablet 3   dorzolamide-timolol (COSOPT) 22.3-6.8 MG/ML ophthalmic solution Place 1 drop into the left eye 2 times daily. 30 mL 11   losartan (COZAAR) 25 MG tablet TAKE 1 TABLET BY MOUTH ONCE DAILY 90 tablet 2   metFORMIN (GLUCOPHAGE-XR) 750 MG 24 hr tablet Take 1 tablet by mouth 2  times daily with a meal. 180 tablet 3   Multiple Vitamin (MULTIVITAMIN) tablet Take 1 tablet by mouth daily.     prednisoLONE acetate (PRED FORTE) 1 % ophthalmic suspension Place 1 drop into both eyes in the morning and at bedtime. 15 mL 11   prednisoLONE acetate (PRED FORTE) 1 % ophthalmic suspension Place 1 drop into both eyes in the morning and at bedtime. 15 mL 11   traZODone (DESYREL) 50 MG tablet Take 1 to 2 by mouth at night for sleep. (Patient taking differently: Take 2 tablets by mouth at night for sleep.) 180 tablet 1   valACYclovir (VALTREX) 1000 MG tablet Take 1 tablet by mouth once daily. 90 tablet 5   fluticasone (FLONASE) 50 MCG/ACT nasal spray Place 2 sprays into both nostrils daily. (Patient not taking: Reported on 12/02/2021) 16 g 6   methocarbamol (ROBAXIN) 500 MG tablet Take 1 tablet (500 mg total) by mouth every 8 (eight) hours as needed for muscle spasms. (Patient not taking: Reported on 12/02/2021) 40 tablet 1   No facility-administered medications prior to visit.    No Known Allergies ROS neg/noncontributory except as noted HPI/below Occ palp-sees card q  97mook.        Objective:     BP 120/60   Pulse (!) 46   Temp 98 F (36.7 C) (Temporal)   Ht '6\' 1"'$  (1.854 m)   Wt 177 lb 2 oz (80.3 kg)   SpO2 98%   BMI 23.37 kg/m  Wt Readings from Last 3 Encounters:  12/02/21 177 lb 2 oz (80.3 kg)  11/29/21 177 lb 12.8 oz (80.6 kg)  09/13/21 180 lb (81.6 kg)    Physical Exam   Gen: WDWN NAD wm HEENT: NCAT, opaque R eye, irreg L and nystagmus NECK:  supple, no thyromegaly, no nodes, no carotid bruits CARDIAC: RRR, S1S2+, no murmur. LUNGS: CTAB. No wheezes EXT:  no edema MSK: no gross abnormalities. Neck-no TTP spine.  +tight muscles.  Some dec rotation.  NEURO: A&O x3.  CN II-XII intact.  PSYCH: normal mood. Good eye  contact     Assessment & Plan:   Problem List Items Addressed This Visit       Endocrine   Type 2 diabetes mellitus with proliferative retinopathy of left eye, without long-term current use of insulin ([place]) - Primary     Other   Depression, major, single episode, in partial remission ([place])   Other Visit Diagnoses     Cervicalgia          DM type 2 w/retinopathy/neuropathy-chronic.  Well controlled.  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  Declines x-rays or PT-work on not tilting head back, stretches to cont mobility, massage to release muscles.   Depression-chronic.  Not well controlled.  They aren't sure if on celexa 20 or 40(which I saw in last rx).  If 40, not helping.  They will check at home and if still 20, will inc to 40-if on 40, will message [place] and will add wellbutrin xl '150mg'$  daily(SED).   F/u 46m No orders of the defined types were placed in this encounter.   AWellington Hampshire MD

## 2021-12-02 NOTE — Patient Instructions (Signed)
Double check on celexa if taking 20 or 40.   If taking 40 then we should add wellbutrin XL '150mg'$  in the am.

## 2021-12-03 ENCOUNTER — Other Ambulatory Visit (HOSPITAL_COMMUNITY): Payer: Self-pay

## 2021-12-03 MED ORDER — BUPROPION HCL ER (XL) 150 MG PO TB24
150.0000 mg | ORAL_TABLET | Freq: Every day | ORAL | 1 refills | Status: DC
  Filled 2021-12-03: qty 90, 90d supply, fill #0
  Filled 2022-04-12: qty 90, 90d supply, fill #1

## 2021-12-03 NOTE — Addendum Note (Signed)
Addended by: Wellington Hampshire on: 12/03/2021 06:07 AM   Modules accepted: Orders

## 2021-12-10 ENCOUNTER — Other Ambulatory Visit (HOSPITAL_COMMUNITY): Payer: Self-pay

## 2021-12-14 ENCOUNTER — Ambulatory Visit: Payer: Medicare Other | Admitting: Family Medicine

## 2021-12-19 ENCOUNTER — Ambulatory Visit (INDEPENDENT_AMBULATORY_CARE_PROVIDER_SITE_OTHER): Payer: Medicare Other | Admitting: Family Medicine

## 2021-12-19 ENCOUNTER — Other Ambulatory Visit (HOSPITAL_COMMUNITY): Payer: Self-pay

## 2021-12-19 ENCOUNTER — Encounter: Payer: Self-pay | Admitting: Family Medicine

## 2021-12-19 VITALS — BP 130/70 | HR 50 | Temp 97.7°F | Ht 73.0 in | Wt 176.5 lb

## 2021-12-19 DIAGNOSIS — H903 Sensorineural hearing loss, bilateral: Secondary | ICD-10-CM | POA: Diagnosis not present

## 2021-12-19 NOTE — Progress Notes (Signed)
Subjective:     Patient ID: Robert Bowen, male    DOB: [birth year], 66 y.o.   MRN: [id number]  Chief Complaint  Patient presents with   Head Congestion    Feels like head is stopped up started about 4 or 5 months ago   Ear Fullness    Right ear feels muffled started about 2 weeks ago    HPI Head stopped for 4-5 months. Voice sounds stopped up.  No face pain.  No runny nose.  Some HA intermitt.  Doesn't use flonase daily as wasn't helping.   R ear fullness/muffled for 2 wks and HOH.when quiet, gets "white noise sound".   L ear a little off for past 6 mo as well.   No f/c. No cough/sore throat.  ENT-saw this yr.  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 Added automatically from request for surgery [id number]   Ruptured globe of left eye 12/31/2019   Formatting of this note might be different from the original. 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 Last Assessment & Plan:  Start Meloxicam. Supportive measures reviewed. If not improving we will need further assessment.   Thrombocytopenia ([place]) 04/21/2019   Type 2 diabetes mellitus with hyperglycemia, without long-term current use of insulin ([place]) 06/26/2019   Type 2 diabetes mellitus with proliferative retinopathy of left eye, without long-term current use of insulin ([place]) 06/26/2019   Uncontrolled type 2 diabetes mellitus with hyperglycemia ([place]) 04/18/2019   Vitreous prolapse, left eye 08/12/2018   Formatting of this note might  be different from the original. Added automatically from request for surgery [id number]  Formatting of this note might be different from the original. Added automatically from request for surgery [id number]    Past Surgical History:  Procedure Laterality Date   EYE SURGERY Left    x4    Outpatient Medications Prior to Visit  Medication Sig Dispense Refill   atorvastatin (LIPITOR) 20 MG tablet TAKE 1 TABLET (20 MG TOTAL)  BY MOUTH DAILY. MUST MAKE APPT FOR REFILLS 90 tablet 3   brimonidine (ALPHAGAN) 0.2 % ophthalmic solution Place 1 drop into the left eye 2 times daily. 15 mL 11   brimonidine (ALPHAGAN) 0.2 % ophthalmic solution Place 1 drop into the left eye 2 times daily. 15 mL 11   buPROPion (WELLBUTRIN XL) 150 MG 24 hr tablet Take 1 tablet (150 mg total) by mouth daily. 90 tablet 1   citalopram (CELEXA) 40 MG tablet TAKE 1 TABLET BY MOUTH ONCE A DAY 90 tablet 0   Continuous Blood Gluc Sensor (FREESTYLE LIBRE 2 SENSOR) MISC Apply 1 sensor every 14 days. 6 each 3   dapagliflozin propanediol (FARXIGA) 10 MG TABS tablet Take 1 tablet (10 mg total) by mouth daily. 90 tablet 3   dorzolamide-timolol (COSOPT) 22.3-6.8 MG/ML ophthalmic solution Place 1 drop into the left eye 2 times daily. 30 mL 11   fluticasone (FLONASE) 50 MCG/ACT nasal spray Place 2 sprays into both nostrils daily. 16 g 6   losartan (COZAAR) 25 MG tablet TAKE 1 TABLET BY MOUTH ONCE DAILY 90 tablet 2   metFORMIN (GLUCOPHAGE-XR) 750 MG 24 hr tablet Take 1 tablet by mouth 2  times daily with a meal. 180 tablet 3   methocarbamol (ROBAXIN) 500 MG tablet Take 1 tablet (500 mg total) by mouth every 8 (eight) hours as needed for muscle spasms. 40 tablet 1   Multiple Vitamin (MULTIVITAMIN) tablet Take 1 tablet by mouth daily.     prednisoLONE acetate (PRED FORTE) 1 % ophthalmic suspension Place 1 drop into both eyes in the morning and at bedtime. 15 mL 11   prednisoLONE acetate (PRED FORTE) 1 % ophthalmic suspension Place 1 drop into both eyes in the morning and at bedtime. 15 mL 11   traZODone (DESYREL) 50 MG tablet Take 1 to 2 by mouth at night for sleep. (Patient taking differently: Take 2 tablets by mouth at night for sleep.) 180 tablet 1   valACYclovir (VALTREX) 1000 MG tablet Take 1 tablet by mouth once daily. 90 tablet 5   No facility-administered medications prior to visit.    No Known Allergies ROS neg/noncontributory except as noted  HPI/below      Objective:     BP 130/70   Pulse (!) 50   Temp 97.7 F (36.5 C) (Temporal)   Ht '6\' 1"'$  (1.854 m)   Wt 176 lb 8 oz (80.1 kg)   SpO2 98%   BMI 23.29 kg/m  Wt Readings from Last 3 Encounters:  12/19/21 176 lb 8 oz (80.1 kg)  12/02/21 177 lb 2 oz (80.3 kg)  11/29/21 177 lb 12.8 oz (80.6 kg)    Physical Exam   Gen: WDWN NAD HEENT: NCAT, conjunctiva not injected TM WNL B-has a lot of hairs in ears.  Scant wax in R, OP moist, no exudates  NECK:  supple, no thyromegaly, no nodes, no carotid bruits CARDIAC: RRR, S1S2+, no murmur. DP 2+B LUNGS: CTAB. No wheezes MSK: no gross abnormalities.  NEURO: A&O x3.  CN II-XII intact.  PSYCH: normal mood. Good eye contact  Reviewed CT sinuses     Assessment & Plan:   Problem List Items Addressed This Visit   None Visit Diagnoses     Sensorineural hearing loss (SNHL) of both ears    -  Primary      Hearing loss/muffled.  I don't see a lot of wax.  R for 2 wks, L for >71mo  Head sounds "congested".   Flonase not work sev mo ago.  Advised to f/u ENT ASAP.  No orders of the defined types were placed in this encounter.   AWellington Hampshire MD

## 2021-12-20 ENCOUNTER — Other Ambulatory Visit (HOSPITAL_COMMUNITY): Payer: Self-pay

## 2021-12-22 ENCOUNTER — Encounter: Payer: Self-pay | Admitting: Family Medicine

## 2021-12-23 ENCOUNTER — Other Ambulatory Visit (HOSPITAL_COMMUNITY): Payer: Self-pay

## 2021-12-23 ENCOUNTER — Other Ambulatory Visit: Payer: Self-pay | Admitting: *Deleted

## 2021-12-23 DIAGNOSIS — H903 Sensorineural hearing loss, bilateral: Secondary | ICD-10-CM

## 2021-12-23 MED ORDER — AMOXICILLIN-POT CLAVULANATE 875-125 MG PO TABS
1.0000 | ORAL_TABLET | Freq: Two times a day (BID) | ORAL | 0 refills | Status: AC
  Filled 2021-12-23: qty 14, 7d supply, fill #0

## 2022-01-09 ENCOUNTER — Ambulatory Visit (INDEPENDENT_AMBULATORY_CARE_PROVIDER_SITE_OTHER): Payer: Medicare Other | Admitting: Family

## 2022-01-09 ENCOUNTER — Encounter: Payer: Self-pay | Admitting: Family

## 2022-01-09 VITALS — BP 144/72 | HR 51 | Temp 98.2°F | Ht 73.0 in | Wt 174.2 lb

## 2022-01-09 DIAGNOSIS — Z794 Long term (current) use of insulin: Secondary | ICD-10-CM

## 2022-01-09 DIAGNOSIS — E1139 Type 2 diabetes mellitus with other diabetic ophthalmic complication: Secondary | ICD-10-CM

## 2022-01-09 DIAGNOSIS — L600 Ingrowing nail: Secondary | ICD-10-CM | POA: Diagnosis not present

## 2022-01-09 NOTE — Progress Notes (Signed)
Patient ID: Robert Bowen, male    DOB: 1955/11/10, 66 y.o.   MRN: [id number]  Chief Complaint  Patient presents with   Nail Problem    left great toe    HPI:      Ingrown nail:  Pt c/o great toe ingrown toe nail on left foot. Pt states the toe nail is discolored, sensitive to touch. Present for a couple of weeks. Denies drainage, no shooting pain in foot, no erythema in foot, just distal nailbed. Has only tried cotton in toe nail which did not help. Pt is diabetic.       Assessment & Plan:  1. Type 2 diabetes mellitus with other ophthalmic complication, with long-term current use of insulin ([place])  - Ambulatory referral to Podiatry  2. Ingrown nail of great toe of left foot erythema in left lower corner of nailbed, no swelling noted, tender to touch. Advised soaking in warm epsom salt water for 15-66mn 1-2x/day, Sending referral to podiatry.  - Ambulatory referral to Podiatry   Subjective:    Outpatient Medications Prior to Visit  Medication Sig Dispense Refill   atorvastatin (LIPITOR) 20 MG tablet TAKE 1 TABLET (20 MG TOTAL) BY MOUTH DAILY. MUST MAKE APPT FOR REFILLS 90 tablet 3   brimonidine (ALPHAGAN) 0.2 % ophthalmic solution Place 1 drop into the left eye 2 times daily. 15 mL 11   brimonidine (ALPHAGAN) 0.2 % ophthalmic solution Place 1 drop into the left eye 2 times daily. 15 mL 11   buPROPion (WELLBUTRIN XL) 150 MG 24 hr tablet Take 1 tablet (150 mg total) by mouth daily. 90 tablet 1   citalopram (CELEXA) 40 MG tablet TAKE 1 TABLET BY MOUTH ONCE A DAY 90 tablet 0   Continuous Blood Gluc Sensor (FREESTYLE LIBRE 2 SENSOR) MISC Apply 1 sensor every 14 days. 6 each 3   dapagliflozin propanediol (FARXIGA) 10 MG TABS tablet Take 1 tablet (10 mg total) by mouth daily. 90 tablet 3   dorzolamide-timolol (COSOPT) 22.3-6.8 MG/ML ophthalmic solution Place 1 drop into the left eye 2 times daily. 30 mL 11   fluticasone (FLONASE) 50 MCG/ACT nasal spray Place 2 sprays into both  nostrils daily. 16 g 6   losartan (COZAAR) 25 MG tablet TAKE 1 TABLET BY MOUTH ONCE DAILY 90 tablet 2   metFORMIN (GLUCOPHAGE-XR) 750 MG 24 hr tablet Take 1 tablet by mouth 2  times daily with a meal. 180 tablet 3   methocarbamol (ROBAXIN) 500 MG tablet Take 1 tablet (500 mg total) by mouth every 8 (eight) hours as needed for muscle spasms. 40 tablet 1   Multiple Vitamin (MULTIVITAMIN) tablet Take 1 tablet by mouth daily.     prednisoLONE acetate (PRED FORTE) 1 % ophthalmic suspension Place 1 drop into both eyes in the morning and at bedtime. 15 mL 11   prednisoLONE acetate (PRED FORTE) 1 % ophthalmic suspension Place 1 drop into both eyes in the morning and at bedtime. 15 mL 11   traZODone (DESYREL) 50 MG tablet Take 1 to 2 by mouth at night for sleep. (Patient taking differently: Take 2 tablets by mouth at night for sleep.) 180 tablet 1   valACYclovir (VALTREX) 1000 MG tablet Take 1 tablet by mouth once daily. 90 tablet 5   No facility-administered medications prior to visit.   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 Added automatically from request for surgery [id number]   Ruptured globe of left eye 12/31/2019   Formatting of this note might be different from the original. 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 Last Assessment & Plan:  Start Meloxicam. Supportive measures reviewed. If not improving we will need further assessment.   Thrombocytopenia ([place]) 04/21/2019   Type 2 diabetes mellitus with hyperglycemia, without long-term current use of insulin ([place]) 06/26/2019   Type 2 diabetes mellitus with proliferative retinopathy of left eye, without long-term current use of insulin ([place]) 06/26/2019   Uncontrolled type 2 diabetes mellitus with hyperglycemia ([place]) 04/18/2019   Vitreous prolapse, left eye 08/12/2018   Formatting of this note might be different from the original. Added automatically from request for surgery [id number]  Formatting of this note might be different from the original. Added automatically from request for surgery [id number]   Past  Surgical History:  Procedure Laterality Date   EYE SURGERY Left    x4   No Known Allergies    Objective:    Physical Exam Vitals and nursing note reviewed.  Constitutional:      General: He is not in acute distress.    Appearance: Normal appearance.  HENT:     Head: Normocephalic.  Cardiovascular:     Rate and Rhythm: Normal rate and regular rhythm.  Pulmonary:     Effort: Pulmonary effort is normal.     Breath sounds: Normal breath sounds.  Musculoskeletal:        General: Normal range of motion.     Cervical back: Normal range of motion.  Skin:    General: Skin is warm and dry.     Findings: Erythema (left lower corner of left great toe nailbed, no swelling, tender to palpation) present.  Neurological:     Mental Status: He is alert and oriented to person, place, and time.  Psychiatric:        Mood and Affect: Mood normal.    BP (!) 144/72 (BP Location: Left Arm, Patient Position: Sitting, Cuff Size: Large)   Pulse (!) 51   Temp 98.2 F (36.8 C) (Temporal)   Ht '6\' 1"'$  (1.854 m)   Wt 174 lb 4 oz (79 kg)   SpO2 97%   BMI 22.99 kg/m  Wt Readings from Last 3 Encounters:  01/09/22 174 lb 4 oz (79 kg)  12/19/21 176 lb 8 oz (80.1 kg)  12/02/21 177 lb 2 oz (80.3 kg)       Jeanie Sewer, NP

## 2022-01-19 ENCOUNTER — Encounter: Payer: Self-pay | Admitting: Podiatry

## 2022-01-19 ENCOUNTER — Ambulatory Visit: Payer: Medicare Other | Admitting: Podiatry

## 2022-01-19 DIAGNOSIS — L600 Ingrowing nail: Secondary | ICD-10-CM

## 2022-01-19 DIAGNOSIS — E1149 Type 2 diabetes mellitus with other diabetic neurological complication: Secondary | ICD-10-CM

## 2022-01-19 DIAGNOSIS — E1140 Type 2 diabetes mellitus with diabetic neuropathy, unspecified: Secondary | ICD-10-CM

## 2022-01-19 NOTE — Progress Notes (Signed)
Subjective:   Patient ID: Robert Bowen, male   DOB: 66 y.o.   MRN: [id number]   HPI Patient presents with caregiver with ingrown toenail deformity of the left hallux lateral border stating that its been sore and that he has trouble with this and has diabetes and it makes him nervous and he is under excellent control currently with his diabetes   Review of Systems  All other systems reviewed and are negative.       Objective:  Physical Exam Vitals and nursing note reviewed.  Constitutional:      Appearance: He is well-developed.  Pulmonary:     Effort: Pulmonary effort is normal.  Musculoskeletal:        General: Normal range of motion.  Skin:    General: Skin is warm.  Neurological:     Mental Status: He is alert.     Vascular status found to be intact mild neurological loss consistent with long-term diabetes with good digital perfusion well oriented x3.  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  Understands diabetes increases risk they are willing to accept this and signed consent form and today I infiltrated the left hallux 60 mg like Marcaine mixture sterile prep done using sterile instrumentation remove the lateral border exposed matrix applied phenol 3 applications 30 seconds followed by alcohol by sterile dressing gave instructions on soaks and to leave dressing on 24 hours take off earlier if throbbing were to occur and to call with any questions concerns which may arise

## 2022-01-19 NOTE — Patient Instructions (Signed)

## 2022-01-26 ENCOUNTER — Encounter: Payer: Self-pay | Admitting: Family Medicine

## 2022-01-26 DIAGNOSIS — J4521 Mild intermittent asthma with (acute) exacerbation: Secondary | ICD-10-CM

## 2022-01-26 DIAGNOSIS — J019 Acute sinusitis, unspecified: Secondary | ICD-10-CM

## 2022-01-28 ENCOUNTER — Other Ambulatory Visit (HOSPITAL_COMMUNITY): Payer: Self-pay

## 2022-02-01 ENCOUNTER — Ambulatory Visit: Payer: Medicare Other | Attending: Cardiology | Admitting: Cardiology

## 2022-02-01 ENCOUNTER — Encounter: Payer: Self-pay | Admitting: Cardiology

## 2022-02-01 VITALS — BP 120/66 | HR 82 | Ht 72.0 in | Wt 176.0 lb

## 2022-02-01 DIAGNOSIS — E782 Mixed hyperlipidemia: Secondary | ICD-10-CM

## 2022-02-01 DIAGNOSIS — E119 Type 2 diabetes mellitus without complications: Secondary | ICD-10-CM

## 2022-02-01 NOTE — Progress Notes (Signed)
Cardiology Office Note:    Date:  02/01/2022   ID:  Robert Bowen, DOB Feb 18, 1956, MRN [id number]  PCP:  Tawnya Crook, MD  Cardiologist:  Jenean Lindau, MD   Referring MD: Libby Maw,*    ASSESSMENT:    1. Mixed hyperlipidemia   2. Controlled type 2 diabetes mellitus without complication, without long-term current use of insulin (HCC)    PLAN:    In order of problems listed above:  Primary prevention stressed with the patient.  Importance of compliance with diet medication stressed and she vocalized understanding.  He was advised to continue his excellent exercise program. Dyslipidemia: On lipid-lowering medications followed by primary care.  Lipids were reviewed. Diabetes mellitus: Followed by his endocrinologist.  He is doing well with diet.  I encouraged him to continue this. He will be seen on an annual basis or earlier if he has any concerns.   Medication Adjustments/Labs and Tests Ordered: Current medicines are reviewed at length with the patient today.  Concerns regarding medicines are outlined above.  No orders of the defined types were placed in this encounter.  No orders of the defined types were placed in this encounter.    No chief complaint on file.    History of Present Illness:    Robert Bowen is a 66 y.o. male.  Patient has past medical history of essential hypertension and mixed dyslipidemia.  He denies any problems at this time and takes care of activities of daily living.  No chest pain orthopnea or PND.  At the time of my evaluation, the patient is alert awake oriented and in no distress.  He mentions to me that he walks half an hour a day on a daily basis.  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 Added automatically from request for surgery [id number]   Ruptured globe of left eye 12/31/2019   Formatting of this note might be different from the original. 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 Last Assessment & Plan:  Start  Meloxicam. Supportive measures reviewed. If not improving we will need further assessment.   Thrombocytopenia ([place]) 04/21/2019   Type 2 diabetes mellitus with hyperglycemia, without long-term current use of insulin ([place]) 06/26/2019   Type 2 diabetes mellitus with proliferative retinopathy of left eye, without long-term current use of insulin ([place]) 06/26/2019   Uncontrolled type 2 diabetes mellitus with hyperglycemia ([place]) 04/18/2019   Vitreous prolapse, left eye 08/12/2018   Formatting of this note might be different from the original. Added automatically from request for surgery [phone]  Formatting of this note might be different from the original. Added automatically from request for surgery [id number]    Past Surgical History:  Procedure Laterality Date   EYE SURGERY Left    x4    Current Medications: Current Meds  Medication Sig   atorvastatin (LIPITOR) 20 MG tablet TAKE 1 TABLET (20 MG TOTAL) BY MOUTH DAILY. MUST MAKE APPT FOR REFILLS   brimonidine (ALPHAGAN) 0.2 % ophthalmic solution Place 1 drop into the left eye 2 times daily.   brimonidine (ALPHAGAN) 0.2 % ophthalmic solution Place 1 drop into the left eye 2 times daily.   buPROPion (WELLBUTRIN XL) 150 MG 24 hr tablet Take 1 tablet (150 mg total) by mouth daily.   citalopram (CELEXA) 40 MG tablet TAKE 1 TABLET BY MOUTH ONCE A DAY   Continuous Blood Gluc Sensor (FREESTYLE LIBRE 2 SENSOR) MISC Apply 1 sensor every 14 days.   dapagliflozin propanediol (FARXIGA) 10 MG TABS tablet Take 1 tablet (10 mg total) by mouth daily.   dorzolamide-timolol (COSOPT) 22.3-6.8 MG/ML ophthalmic solution Place 1 drop into the left eye 2 times daily.   losartan (COZAAR) 25 MG tablet TAKE 1 TABLET BY MOUTH ONCE DAILY   metFORMIN (GLUCOPHAGE-XR) 750 MG 24 hr tablet Take 1 tablet by mouth 2  times daily with a meal.   Multiple Vitamin (MULTIVITAMIN) tablet Take 1 tablet by mouth daily.   prednisoLONE acetate (PRED FORTE) 1 % ophthalmic suspension Place 1  drop into both eyes in the morning and at bedtime.   prednisoLONE acetate (PRED FORTE) 1 % ophthalmic suspension Place 1 drop into both eyes in the morning and at bedtime.   traZODone (DESYREL) 50 MG tablet Take 1 to 2 by mouth at night for sleep.   valACYclovir (VALTREX) 1000 MG tablet Take 1 tablet by mouth once daily.     Allergies:   Patient has no known allergies.   Social History   Socioeconomic History   Marital status: Married    Spouse name: Not on file   Number of children: 3   Years of education: Not on file   Highest education level: Not on file  Occupational History   Not on file  Tobacco Use   Smoking status: Never   Smokeless tobacco: Never  Vaping Use   Vaping Use: Never used  Substance and Sexual Activity   Alcohol use: Yes    Alcohol/week: 2.0 standard drinks of alcohol    Types: 2 Standard drinks or equivalent per week    Comment: just on occassion, rarely   Drug use: No   Sexual activity: Yes  Other Topics Concern   Not on file  Social History Narrative   Part time employment.Non-denominational pastor.   5 grand 1 great   Social Determinants of Health   Financial Resource Strain: Low Risk  (02/01/2021)   Overall Financial Resource Strain (CARDIA)    Difficulty of Paying Living Expenses: Not hard at all  Food Insecurity: No Food Insecurity (02/01/2021)   Hunger Vital Sign    Worried About Running Out of Food in the Last Year: Never true    Ran Out of Food in the Last Year: Never true  Transportation Needs: No Transportation Needs (02/01/2021)   PRAPARE - Hydrologist (Medical): No    Lack of Transportation (Non-Medical): No  Physical Activity: Inactive (02/01/2021)   Exercise Vital Sign    Days of Exercise per Week: 0 days    Minutes of Exercise per Session: 0 min  Stress: No Stress Concern Present (02/01/2021)   [place]    Feeling of Stress : Not at  all  Social Connections: [place] (02/01/2021)   Social Connection and Isolation Panel [NHANES]    Frequency of Communication with Friends and Family: Twice a week    Frequency of Social Gatherings with Friends and Family: Once a week    Attends Religious Services: More than 4 times per year    Active Member of [employer] or Organizations: Yes    Attends Archivist Meetings: Never    Marital Status: Married     Family History: The patient's family history includes Diabetes type II in his father; Heart disease in his father; Liver cancer in his mother. There is no history of Colon cancer, Esophageal cancer, Stomach cancer, Rectal cancer, or Colon polyps.  ROS:   Please see the history of present illness.    All other systems reviewed and are negative.  EKGs/Labs/Other Studies Reviewed:    The following studies were reviewed today: EKG reveals sinus rhythm and nonspecific ST-T changes and was done in the past   Recent Labs: 09/13/2021: ALT 23; BUN 13; Creatinine, Ser 0.97; Potassium 4.3; Sodium 136  Recent Lipid Panel    Component Value Date/Time   CHOL 120 09/13/2021 0930   TRIG 106.0 09/13/2021 0930   HDL 39.50 09/13/2021 0930   CHOLHDL 3 09/13/2021 0930   VLDL 21.2 09/13/2021 0930   LDLCALC 59 09/13/2021 0930   LDLDIRECT 97.0 05/10/2018 1033    Physical Exam:    VS:  BP 120/66   Pulse 82   Ht 6' (1.829 m)   Wt 176 lb 0.6 oz (79.9 kg)   SpO2 96%   BMI 23.88 kg/m     Wt Readings from Last 3 Encounters:  02/01/22 176 lb 0.6 oz (79.9 kg)  01/09/22 174 lb 4 oz (79 kg)  12/19/21 176 lb 8 oz (80.1 kg)     GEN: Patient is in no acute distress HEENT: Normal NECK: No JVD; No carotid bruits LYMPHATICS: No lymphadenopathy CARDIAC: Hear sounds regular, 2/6 systolic murmur at the apex. RESPIRATORY:  Clear to auscultation without rales, wheezing or rhonchi  ABDOMEN: Soft, non-tender, non-distended MUSCULOSKELETAL:  No edema; No deformity  SKIN: Warm and  dry NEUROLOGIC:  Alert and oriented x 3 PSYCHIATRIC:  Normal affect   Signed, Jenean Lindau, MD  02/01/2022 1:54 PM    [facility]

## 2022-02-01 NOTE — Patient Instructions (Signed)

## 2022-02-15 ENCOUNTER — Other Ambulatory Visit: Payer: Self-pay | Admitting: Family Medicine

## 2022-02-15 ENCOUNTER — Other Ambulatory Visit (HOSPITAL_COMMUNITY): Payer: Self-pay

## 2022-02-15 DIAGNOSIS — F32A Depression, unspecified: Secondary | ICD-10-CM

## 2022-02-16 ENCOUNTER — Other Ambulatory Visit (HOSPITAL_COMMUNITY): Payer: Self-pay

## 2022-02-16 MED ORDER — CITALOPRAM HYDROBROMIDE 40 MG PO TABS
40.0000 mg | ORAL_TABLET | Freq: Every day | ORAL | 0 refills | Status: DC
  Filled 2022-02-16: qty 90, 90d supply, fill #0

## 2022-03-03 ENCOUNTER — Ambulatory Visit (INDEPENDENT_AMBULATORY_CARE_PROVIDER_SITE_OTHER): Payer: Medicare Other

## 2022-03-03 VITALS — Wt 172.0 lb

## 2022-03-03 DIAGNOSIS — Z0000 Encounter for general adult medical examination without abnormal findings: Secondary | ICD-10-CM

## 2022-03-03 NOTE — Progress Notes (Signed)
I connected with  Robert Bowen on 03/03/22 by a audio enabled telemedicine application and verified that I am speaking with the correct person using two identifiers.  Patient Location: Home  Provider Location: Office/Clinic  I discussed the limitations of evaluation and management by telemedicine. The patient expressed understanding and agreed to proceed.  Subjective:   Robert Bowen is a 66 y.o. male who presents for Medicare Annual/Subsequent preventive examination.  Review of Systems     Cardiac Risk Factors include: advanced age (>21mn, >>94women);diabetes mellitus;hypertension;dyslipidemia;male gender     Objective:    Today's Vitals   03/03/22 1002  Weight: 172 lb (78 kg)   Body mass index is 23.33 kg/m.     03/03/2022   10:07 AM 02/01/2021    8:40 AM 11/19/2020    9:20 AM 07/11/2020    9:57 PM 12/12/2019   10:47 AM  Advanced Directives  Does Patient Have a Medical Advance Directive? Yes No No No No  Type of [place]      Copy of [place]? No - copy requested      Would patient like information on creating a medical advance directive?  No - Patient declined No - Patient declined Yes (ED - Information included in AVS)     Current Medications (verified) Outpatient Encounter Medications as of 03/03/2022  Medication Sig   atorvastatin (LIPITOR) 20 MG tablet TAKE 1 TABLET (20 MG TOTAL) BY MOUTH DAILY. MUST MAKE APPT FOR REFILLS   brimonidine (ALPHAGAN) 0.2 % ophthalmic solution Place 1 drop into the left eye 2 times daily.   buPROPion (WELLBUTRIN XL) 150 MG 24 hr tablet Take 1 tablet (150 mg total) by mouth daily.   citalopram (CELEXA) 40 MG tablet Take 1 tablet (40 mg total) by mouth daily.   Continuous Blood Gluc Sensor (FREESTYLE LIBRE 2 SENSOR) MISC Apply 1 sensor every 14 days.   dapagliflozin propanediol (FARXIGA) 10 MG TABS tablet Take 1 tablet (10 mg total) by mouth daily.    dorzolamide-timolol (COSOPT) 22.3-6.8 MG/ML ophthalmic solution Place 1 drop into the left eye 2 times daily.   losartan (COZAAR) 25 MG tablet TAKE 1 TABLET BY MOUTH ONCE DAILY   metFORMIN (GLUCOPHAGE-XR) 750 MG 24 hr tablet Take 1 tablet by mouth 2  times daily with a meal.   Multiple Vitamin (MULTIVITAMIN) tablet Take 1 tablet by mouth daily.   prednisoLONE acetate (PRED FORTE) 1 % ophthalmic suspension Place 1 drop into both eyes in the morning and at bedtime.   traZODone (DESYREL) 50 MG tablet Take 1 to 2 by mouth at night for sleep.   valACYclovir (VALTREX) 1000 MG tablet Take 1 tablet by mouth once daily.   brimonidine (ALPHAGAN) 0.2 % ophthalmic solution Place 1 drop into the left eye 2 times daily.   prednisoLONE acetate (PRED FORTE) 1 % ophthalmic suspension Place 1 drop into both eyes in the morning and at bedtime.   No facility-administered encounter medications on file as of 03/03/2022.    Allergies (verified) Patient has no known allergies.   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 Added automatically from request for surgery [id number]   Ruptured globe of left eye 12/31/2019   Formatting of this note might be different from the original. 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 Last Assessment & Plan:  Start Meloxicam. Supportive measures reviewed. If not improving we will need further assessment.   Thrombocytopenia ([place]) 04/21/2019   Type 2 diabetes mellitus with hyperglycemia, without long-term current use of insulin ([place]) 06/26/2019   Type 2 diabetes mellitus with proliferative retinopathy of left eye, without long-term current use of insulin ([place]) 06/26/2019   Uncontrolled type 2 diabetes mellitus with hyperglycemia ([place]) 04/18/2019   Vitreous prolapse, left eye 08/12/2018   Formatting of this note might be different from the original. Added automatically from request for surgery [id number]  Formatting of this note might be different from the original. Added automatically from  request for surgery [id number]   Past Surgical History:  Procedure Laterality Date   EYE SURGERY Left    x4   Family History  Problem Relation Age of Onset   Liver cancer Mother    Diabetes type II Father    Heart disease Father    Colon cancer Neg Hx    Esophageal cancer Neg Hx    Stomach cancer Neg Hx    Rectal cancer Neg Hx    Colon polyps Neg Hx    Social History   Socioeconomic History   Marital status: Married    Spouse name: Not on file   Number of children: 3   Years of education: Not on file   Highest education level: Not on file  Occupational History   Not on file  Tobacco Use   Smoking status: Never   Smokeless tobacco: Never  Vaping Use   Vaping Use: Never used  Substance and Sexual Activity   Alcohol use: Yes    Alcohol/week: 2.0 standard drinks of alcohol    Types: 2 Standard drinks or equivalent per week    Comment: just on occassion, rarely  Drug use: No   Sexual activity: Yes  Other Topics Concern   Not on file  Social History Narrative   Part time employment.Non-denominational pastor.   5 grand 1 great   Social Determinants of Health   Financial Resource Strain: Low Risk  (03/03/2022)   Overall Financial Resource Strain (CARDIA)    Difficulty of Paying Living Expenses: Not hard at all  Food Insecurity: No Food Insecurity (03/03/2022)   Hunger Vital Sign    Worried About Running Out of Food in the Last Year: Never true    Ran Out of Food in the Last Year: Never true  Transportation Needs: No Transportation Needs (03/03/2022)   PRAPARE - Hydrologist (Medical): No    Lack of Transportation (Non-Medical): No  Physical Activity: Insufficiently Active (03/03/2022)   Exercise Vital Sign    Days of Exercise per Week: 1 day    Minutes of Exercise per Session: 20 min  Stress: No Stress Concern Present (03/03/2022)   [place]    Feeling of Stress : Not at  all  Social Connections: Moderately Integrated (03/03/2022)   Social Connection and Isolation Panel [NHANES]    Frequency of Communication with Friends and Family: More than three times a week    Frequency of Social Gatherings with Friends and Family: More than three times a week    Attends Religious Services: More than 4 times per year    Active Member of [employer] or Organizations: No    Attends Music therapist: Never    Marital Status: Married    Tobacco Counseling Counseling given: Not Answered   Clinical Intake:  Pre-visit preparation completed: Yes  Pain : No/denies pain     BMI - recorded: 23.33 Nutritional Risks: None Diabetes: Yes CBG done?: Yes (134 per pt) CBG resulted in Enter/ Edit results?: No Did pt. bring in CBG monitor from home?: No  How often do you need to have someone help you when you read instructions, pamphlets, or other written materials from your doctor or pharmacy?: 1 - Never  Diabetic?Nutrition Risk Assessment:  Has the patient had any N/V/D within the last 2 months?  No  Does the patient have any non-healing wounds?  No  Has the patient had any unintentional weight loss or weight gain?  No   Diabetes:  Is the patient diabetic?  Yes  If diabetic, was a CBG obtained today?  Yes  Did the patient bring in their glucometer from home?  No  How often do you monitor your CBG's? Daily .   Financial Strains and Diabetes Management:  Are you having any financial strains with the device, your supplies or your medication? No .  Does the patient want to be seen by Chronic Care Management for management of their diabetes?  No  Would the patient like to be referred to a Nutritionist or for Diabetic Management?  No   Diabetic Exams:  Diabetic Eye Exam: Completed 08/09/21 Diabetic Foot Exam: Completed 11/09/21   Interpreter Needed?: No  Information entered by :: Charlott Rakes, LPN   Activities of Daily Living    03/03/2022   10:08 AM  03/01/2022    9:32 AM  In your present state of health, do you have any difficulty performing the following activities:  Hearing? 1   Comment HOH   Vision? 0 1  Difficulty concentrating or making decisions? 0 0  Walking or climbing stairs? 0  0  Dressing or bathing? 0 0  Doing errands, shopping? 0 0  Preparing Food and eating ? N N  Using the Toilet? N N  In the past six months, have you accidently leaked urine? N N  Do you have problems with loss of bowel control? N N  Managing your Medications? N N  Managing your Finances? N N  Housekeeping or managing your Housekeeping? N N    Patient Care Team: Tawnya Crook, MD as PCP - General (Family Medicine)  Indicate any recent Medical Services you may have received from other than [facility] providers in the past year (date may be approximate).     Assessment:   This is a routine wellness examination for Robert Bowen.  Hearing/Vision screen Hearing Screening - Comments:: Pt stated HOH  Vision Screening - Comments:: Pt follows up with Dr Thayer Jew for an annual eye exams   Dietary issues and exercise activities discussed: Current Exercise Habits: Home exercise routine, Type of exercise: Other - see comments, Time (Minutes): 20, Frequency (Times/Week): 1, Weekly Exercise (Minutes/Week): 20   Goals Addressed             This Visit's Progress    Patient Stated       Lose weight and more exercise        Depression Screen    03/03/2022   10:05 AM 01/09/2022   10:35 AM 09/13/2021    9:20 AM 09/13/2021    8:48 AM 03/15/2021    8:27 AM 02/09/2021   10:57 AM 02/01/2021    8:37 AM  PHQ 2/9 Scores  PHQ - 2 Score 0 0 2 1 0 0 0  PHQ- 9 Score 0 0 3        Fall Risk    03/03/2022   10:08 AM 03/01/2022    9:32 AM 12/02/2021   11:11 AM 09/13/2021    8:48 AM 03/15/2021    8:27 AM  Fall Risk   Falls in the past year? 0 0 0 0 0  Number falls in past yr: 0 0 0 0 0  Injury with Fall? 0 0 0    Risk for fall due to :   No Fall Risks    Follow up  Falls prevention discussed  Falls evaluation completed      [place]:  Any stairs in or around the home? No  If so, are there any without handrails? No  Home free of loose throw rugs in walkways, pet beds, electrical cords, etc? Yes  Adequate lighting in your home to reduce risk of falls? Yes   ASSISTIVE DEVICES UTILIZED TO PREVENT FALLS:  Life alert? No  Use of a cane, walker or w/c? No  Grab bars in the bathroom? Yes  Shower chair or bench in shower? No  Elevated toilet seat or a handicapped toilet? No   TIMED UP AND GO:  Was the test performed? No .   Cognitive Function:        03/03/2022   10:09 AM  6CIT Screen  What Year? 0 points  What month? 0 points  What time? 0 points  Count back from 20 0 points  Months in reverse 0 points  Repeat phrase 0 points  Total Score 0 points    Immunizations Immunization History  Administered Date(s) Administered   Fluad Quad(high Dose 65+) 01/27/2021   Influenza,inj,Quad PF,6+ Mos 03/01/2016, 03/01/2017, 01/15/2018, 04/18/2019   PFIZER(Purple Top)SARS-COV-2 Vaccination 06/24/2019, 07/15/2019, 02/20/2020  PNEUMOCOCCAL CONJUGATE-20 03/15/2021   Tdap 03/01/2014, 12/31/2019    TDAP status: Up to date  Flu Vaccine status: Due, Education has been provided regarding the importance of this vaccine. Advised may receive this vaccine at local pharmacy or Health Dept. Aware to provide a copy of the vaccination record if obtained from local pharmacy or Health Dept. Verbalized acceptance and understanding.  Pneumococcal vaccine status: Up to date  Covid-19 vaccine status: Completed vaccines  Qualifies for Shingles Vaccine? Yes   Zostavax completed No   Shingrix Completed?: No.    Education has been provided regarding the importance of this vaccine. Patient has been advised to call insurance company to determine out of pocket expense if they have not yet received this vaccine. Advised may also receive  vaccine at local pharmacy or Health Dept. Verbalized acceptance and understanding.  Screening Tests Health Maintenance  Topic Date Due   Hepatitis C Screening  Never done   Zoster Vaccines- Shingrix (1 of 2) Never done   Diabetic kidney evaluation - Urine ACR  04/17/2020   INFLUENZA VACCINE  07/03/2022 (Originally 11/01/2021)   HEMOGLOBIN A1C  06/01/2022   OPHTHALMOLOGY EXAM  08/10/2022   Diabetic kidney evaluation - GFR measurement  09/14/2022   FOOT EXAM  11/30/2022   Medicare Annual Wellness (AWV)  03/04/2023   COLONOSCOPY (Pts 45-61yr Insurance coverage will need to be confirmed)  05/05/2024   DTaP/Tdap/Td (3 - Td or Tdap) 12/30/2029   Pneumonia Vaccine 66 Years old  Completed   HPV VACCINES  Aged Out   COVID-19 Vaccine  Discontinued    Health Maintenance  Health Maintenance Due  Topic Date Due   Hepatitis C Screening  Never done   Zoster Vaccines- Shingrix (1 of 2) Never done   Diabetic kidney evaluation - Urine ACR  04/17/2020    Colorectal cancer screening: Type of screening: Colonoscopy. Completed 05/05/21. Repeat every 3 years  Additional Screening:  Hepatitis C Screening: does qualify;  Vision Screening: Recommended annual ophthalmology exams for early detection of glaucoma and other disorders of the eye. Is the patient up to date with their annual eye exam?  Yes  Who is the provider or what is the name of the office in which the patient attends annual eye exams? Dr WThayer Jew If pt is not established with a provider, would they like to be referred to a provider to establish care? No .   Dental Screening: Recommended annual dental exams for proper oral hygiene  Community Resource Referral / Chronic Care Management: CRR required this visit?  No   CCM required this visit?  No      Plan:     I have personally reviewed and noted the following in the patient's chart:   Medical and social history Use of alcohol, tobacco or illicit drugs  Current medications and  supplements including opioid prescriptions. Patient is not currently taking opioid prescriptions. Functional ability and status Nutritional status Physical activity Advanced directives List of other physicians Hospitalizations, surgeries, and ER visits in previous 12 months Vitals Screenings to include cognitive, depression, and falls Referrals and appointments  In addition, I have reviewed and discussed with patient certain preventive protocols, quality metrics, and best practice recommendations. A written personalized care plan for preventive services as well as general preventive health recommendations were provided to patient.     TWillette Brace LPN   [id number]  Nurse Notes: none

## 2022-03-03 NOTE — Patient Instructions (Signed)
Robert Bowen , Thank you for taking time to come for your Medicare Wellness Visit. I appreciate your ongoing commitment to your health goals. Please review the following plan we discussed and let me know if I can assist you in the future.   These are the goals we discussed:  Goals      Patient Stated     Loose weight     Patient Stated     Lose weight and more exercise         This is a list of the screening recommended for you and due dates:  Health Maintenance  Topic Date Due   Hepatitis C Screening: USPSTF Recommendation to screen - Ages 32-79 yo.  Never done   Zoster (Shingles) Vaccine (1 of 2) Never done   Yearly kidney health urinalysis for diabetes  04/17/2020   Flu Shot  07/03/2022*   Hemoglobin A1C  06/01/2022   Eye exam for diabetics  08/10/2022   Yearly kidney function blood test for diabetes  09/14/2022   Complete foot exam   11/30/2022   Medicare Annual Wellness Visit  03/04/2023   Colon Cancer Screening  05/05/2024   DTaP/Tdap/Td vaccine (3 - Td or Tdap) 12/30/2029   Pneumonia Vaccine  Completed   HPV Vaccine  Aged Out   COVID-19 Vaccine  Discontinued  *Topic was postponed. The date shown is not the original due date.    Advanced directives: Please bring a copy of your health care power of attorney and living will to the office at your convenience.  Conditions/risks identified: lose weight and more exercise   Next appointment: Follow up in one year for your annual wellness visit.   Preventive Care 45 Years and Older, Male  Preventive care refers to lifestyle choices and visits with your health care provider that can promote health and wellness. What does preventive care include? A yearly physical exam. This is also called an annual well check. Dental exams once or twice a year. Routine eye exams. Ask your health care provider how often you should have your eyes checked. Personal lifestyle choices, including: Daily care of your teeth and gums. Regular  physical activity. Eating a healthy diet. Avoiding tobacco and drug use. Limiting alcohol use. Practicing safe sex. Taking low doses of aspirin every day. Taking vitamin and mineral supplements as recommended by your health care provider. What happens during an annual well check? The services and screenings done by your health care provider during your annual well check will depend on your age, overall health, lifestyle risk factors, and family history of disease. Counseling  Your health care provider may ask you questions about your: Alcohol use. Tobacco use. Drug use. Emotional well-being. Home and relationship well-being. Sexual activity. Eating habits. History of falls. Memory and ability to understand (cognition). Work and work Statistician. Screening  You may have the following tests or measurements: Height, weight, and BMI. Blood pressure. Lipid and cholesterol levels. These may be checked every 5 years, or more frequently if you are over 62 years old. Skin check. Lung cancer screening. You may have this screening every year starting at age 57 if you have a 30-pack-year history of smoking and currently smoke or have quit within the past 15 years. Fecal occult blood test (FOBT) of the stool. You may have this test every year starting at age 61. Flexible sigmoidoscopy or colonoscopy. You may have a sigmoidoscopy every 5 years or a colonoscopy every 10 years starting at age 20. Prostate cancer screening.  Recommendations will vary depending on your family history and other risks. Hepatitis C blood test. Hepatitis B blood test. Sexually transmitted disease (STD) testing. Diabetes screening. This is done by checking your blood sugar (glucose) after you have not eaten for a while (fasting). You may have this done every 1-3 years. Abdominal aortic aneurysm (AAA) screening. You may need this if you are a current or former Bowen. Osteoporosis. You may be screened starting at age 46  if you are at high risk. Talk with your health care provider about your test results, treatment options, and if necessary, the need for more tests. Vaccines  Your health care provider may recommend certain vaccines, such as: Influenza vaccine. This is recommended every year. Tetanus, diphtheria, and acellular pertussis (Tdap, Td) vaccine. You may need a Td booster every 10 years. Zoster vaccine. You may need this after age 38. Pneumococcal 13-valent conjugate (PCV13) vaccine. One dose is recommended after age 63. Pneumococcal polysaccharide (PPSV23) vaccine. One dose is recommended after age 47. Talk to your health care provider about which screenings and vaccines you need and how often you need them. This information is not intended to replace advice given to you by your health care provider. Make sure you discuss any questions you have with your health care provider. Document Released: 04/16/2015 Document Revised: 12/08/2015 Document Reviewed: 01/19/2015 Elsevier Interactive Patient Education  2017 [place] Prevention in the Home Falls can cause injuries. They can happen to people of all ages. There are many things you can do to make your home safe and to help prevent falls. What can I do on the outside of my home? Regularly fix the edges of walkways and driveways and fix any cracks. Remove anything that might make you trip as you walk through a door, such as a raised step or threshold. Trim any bushes or trees on the path to your home. Use bright outdoor lighting. Clear any walking paths of anything that might make someone trip, such as rocks or tools. Regularly check to see if handrails are loose or broken. Make sure that both sides of any steps have handrails. Any raised decks and porches should have guardrails on the edges. Have any leaves, snow, or ice cleared regularly. Use sand or salt on walking paths during winter. Clean up any spills in your garage right away. This  includes oil or grease spills. What can I do in the bathroom? Use night lights. Install grab bars by the toilet and in the tub and shower. Do not use towel bars as grab bars. Use non-skid mats or decals in the tub or shower. If you need to sit down in the shower, use a plastic, non-slip stool. Keep the floor dry. Clean up any water that spills on the floor as soon as it happens. Remove soap buildup in the tub or shower regularly. Attach bath mats securely with double-sided non-slip rug tape. Do not have throw rugs and other things on the floor that can make you trip. What can I do in the bedroom? Use night lights. Make sure that you have a light by your bed that is easy to reach. Do not use any sheets or blankets that are too big for your bed. They should not hang down onto the floor. Have a firm chair that has side arms. You can use this for support while you get dressed. Do not have throw rugs and other things on the floor that can make you trip. What can I  do in the kitchen? Clean up any spills right away. Avoid walking on wet floors. Keep items that you use a lot in easy-to-reach places. If you need to reach something above you, use a strong step stool that has a grab bar. Keep electrical cords out of the way. Do not use floor polish or wax that makes floors slippery. If you must use wax, use non-skid floor wax. Do not have throw rugs and other things on the floor that can make you trip. What can I do with my stairs? Do not leave any items on the stairs. Make sure that there are handrails on both sides of the stairs and use them. Fix handrails that are broken or loose. Make sure that handrails are as long as the stairways. Check any carpeting to make sure that it is firmly attached to the stairs. Fix any carpet that is loose or worn. Avoid having throw rugs at the top or bottom of the stairs. If you do have throw rugs, attach them to the floor with carpet tape. Make sure that you  have a light switch at the top of the stairs and the bottom of the stairs. If you do not have them, ask someone to add them for you. What else can I do to help prevent falls? Wear shoes that: Do not have high heels. Have rubber bottoms. Are comfortable and fit you well. Are closed at the toe. Do not wear sandals. If you use a stepladder: Make sure that it is fully opened. Do not climb a closed stepladder. Make sure that both sides of the stepladder are locked into place. Ask someone to hold it for you, if possible. Clearly mark and make sure that you can see: Any grab bars or handrails. First and last steps. Where the edge of each step is. Use tools that help you move around (mobility aids) if they are needed. These include: Canes. Walkers. Scooters. Crutches. Turn on the lights when you go into a dark area. Replace any light bulbs as soon as they burn out. Set up your furniture so you have a clear path. Avoid moving your furniture around. If any of your floors are uneven, fix them. If there are any pets around you, be aware of where they are. Review your medicines with your doctor. Some medicines can make you feel dizzy. This can increase your chance of falling. Ask your doctor what other things that you can do to help prevent falls. This information is not intended to replace advice given to you by your health care provider. Make sure you discuss any questions you have with your health care provider. Document Released: 01/14/2009 Document Revised: 08/26/2015 Document Reviewed: 04/24/2014 Elsevier Interactive Patient Education  2017 [employer].

## 2022-03-07 ENCOUNTER — Other Ambulatory Visit (HOSPITAL_COMMUNITY): Payer: Self-pay

## 2022-03-08 ENCOUNTER — Telehealth: Payer: Self-pay | Admitting: Internal Medicine

## 2022-03-08 NOTE — Telephone Encounter (Signed)
Patient's wife, Altha Harm, brought in completed AZ&ME Application for [employer] [facility].  Form is in Dr. Quin Hoop folder in front office.

## 2022-03-28 ENCOUNTER — Other Ambulatory Visit (HOSPITAL_COMMUNITY): Payer: Self-pay

## 2022-03-30 ENCOUNTER — Other Ambulatory Visit (HOSPITAL_COMMUNITY): Payer: Self-pay

## 2022-04-12 ENCOUNTER — Other Ambulatory Visit (HOSPITAL_COMMUNITY): Payer: Self-pay

## 2022-04-12 ENCOUNTER — Other Ambulatory Visit: Payer: Self-pay | Admitting: Family Medicine

## 2022-04-12 DIAGNOSIS — E7800 Pure hypercholesterolemia, unspecified: Secondary | ICD-10-CM

## 2022-04-24 ENCOUNTER — Other Ambulatory Visit (HOSPITAL_COMMUNITY): Payer: Self-pay

## 2022-04-27 ENCOUNTER — Other Ambulatory Visit: Payer: Self-pay | Admitting: Family Medicine

## 2022-04-27 ENCOUNTER — Other Ambulatory Visit (HOSPITAL_COMMUNITY): Payer: Self-pay

## 2022-04-27 DIAGNOSIS — G4700 Insomnia, unspecified: Secondary | ICD-10-CM

## 2022-04-27 DIAGNOSIS — E7800 Pure hypercholesterolemia, unspecified: Secondary | ICD-10-CM

## 2022-04-27 MED ORDER — ATORVASTATIN CALCIUM 20 MG PO TABS
20.0000 mg | ORAL_TABLET | Freq: Every day | ORAL | 3 refills | Status: DC
  Filled 2022-04-27: qty 90, 90d supply, fill #0

## 2022-04-27 MED ORDER — TRAZODONE HCL 50 MG PO TABS
50.0000 mg | ORAL_TABLET | Freq: Every evening | ORAL | 1 refills | Status: DC
  Filled 2022-04-27: qty 180, 90d supply, fill #0

## 2022-04-28 ENCOUNTER — Other Ambulatory Visit (HOSPITAL_COMMUNITY): Payer: Self-pay

## 2022-06-02 ENCOUNTER — Other Ambulatory Visit (HOSPITAL_COMMUNITY): Payer: Self-pay

## 2022-06-02 ENCOUNTER — Ambulatory Visit (INDEPENDENT_AMBULATORY_CARE_PROVIDER_SITE_OTHER): Payer: Medicare Other | Admitting: Family Medicine

## 2022-06-02 ENCOUNTER — Encounter: Payer: Self-pay | Admitting: Family Medicine

## 2022-06-02 VITALS — BP 130/60 | HR 82 | Temp 97.9°F | Ht 73.0 in | Wt 175.1 lb

## 2022-06-02 DIAGNOSIS — F419 Anxiety disorder, unspecified: Secondary | ICD-10-CM

## 2022-06-02 DIAGNOSIS — G4700 Insomnia, unspecified: Secondary | ICD-10-CM

## 2022-06-02 DIAGNOSIS — R059 Cough, unspecified: Secondary | ICD-10-CM

## 2022-06-02 DIAGNOSIS — E1165 Type 2 diabetes mellitus with hyperglycemia: Secondary | ICD-10-CM | POA: Diagnosis not present

## 2022-06-02 DIAGNOSIS — Z1159 Encounter for screening for other viral diseases: Secondary | ICD-10-CM | POA: Diagnosis not present

## 2022-06-02 DIAGNOSIS — F32A Depression, unspecified: Secondary | ICD-10-CM | POA: Diagnosis not present

## 2022-06-02 DIAGNOSIS — E782 Mixed hyperlipidemia: Secondary | ICD-10-CM

## 2022-06-02 DIAGNOSIS — I10 Essential (primary) hypertension: Secondary | ICD-10-CM

## 2022-06-02 DIAGNOSIS — R519 Headache, unspecified: Secondary | ICD-10-CM | POA: Diagnosis not present

## 2022-06-02 LAB — COMPREHENSIVE METABOLIC PANEL
ALT: 19 U/L (ref 0–53)
AST: 18 U/L (ref 0–37)
Albumin: 4.4 g/dL (ref 3.5–5.2)
Alkaline Phosphatase: 73 U/L (ref 39–117)
BUN: 14 mg/dL (ref 6–23)
CO2: 30 mEq/L (ref 19–32)
Calcium: 9.5 mg/dL (ref 8.4–10.5)
Chloride: 100 mEq/L (ref 96–112)
Creatinine, Ser: 0.9 mg/dL (ref 0.40–1.50)
GFR: 88.36 mL/min (ref >60.00)
Glucose, Bld: 116 mg/dL — ABNORMAL HIGH (ref 70–99)
Potassium: 4.3 mEq/L (ref 3.5–5.1)
Sodium: 138 mEq/L (ref 135–145)
Total Bilirubin: 1.2 mg/dL (ref 0.2–1.2)
Total Protein: 7 g/dL (ref 6.0–8.3)

## 2022-06-02 LAB — CBC WITH DIFFERENTIAL/PLATELET
Basophils Absolute: 0 10*3/uL (ref 0.0–0.1)
Basophils Relative: 0.4 % (ref 0.0–3.0)
Eosinophils Absolute: 0.2 10*3/uL (ref 0.0–0.7)
Eosinophils Relative: 4.2 % (ref 0.0–5.0)
HCT: 42.5 % (ref 39.0–52.0)
Hemoglobin: 15.1 g/dL (ref 13.0–17.0)
Lymphocytes Relative: 20.1 % (ref 12.0–46.0)
Lymphs Abs: 1 10*3/uL (ref 0.7–4.0)
MCHC: 35.5 g/dL (ref 30.0–36.0)
MCV: 88.8 fl (ref 78.0–100.0)
Monocytes Absolute: 0.5 10*3/uL (ref 0.1–1.0)
Monocytes Relative: 10.1 % (ref 3.0–12.0)
Neutro Abs: 3.3 10*3/uL (ref 1.4–7.7)
Neutrophils Relative %: 65.2 % (ref 43.0–77.0)
Platelets: 75 10*3/uL — ABNORMAL LOW (ref 150.0–400.0)
RBC: 4.79 Mil/uL (ref 4.22–5.81)
RDW: 15.2 % (ref 11.5–15.5)
WBC: 5.1 10*3/uL (ref 4.0–10.5)

## 2022-06-02 LAB — VITAMIN B12: Vitamin B-12: 367 pg/mL (ref 211–911)

## 2022-06-02 LAB — LIPID PANEL
Cholesterol: 114 mg/dL (ref 0–200)
HDL: 37.2 mg/dL — ABNORMAL LOW (ref >39.00)
LDL Cholesterol: 53 mg/dL (ref 0–99)
NonHDL: 76.88
Total CHOL/HDL Ratio: 3
Triglycerides: 118 mg/dL (ref 0.0–149.0)
VLDL: 23.6 mg/dL (ref 0.0–40.0)

## 2022-06-02 LAB — MICROALBUMIN / CREATININE URINE RATIO
Creatinine,U: 37.5 mg/dL
Microalb Creat Ratio: 1.9 mg/g (ref 0.0–30.0)
Microalb, Ur: <0.7 mg/dL (ref 0.0–1.9)

## 2022-06-02 LAB — POC COVID19 BINAXNOW: SARS Coronavirus 2 Ag: NEGATIVE

## 2022-06-02 LAB — TSH: TSH: 1.26 u[IU]/mL (ref 0.35–5.50)

## 2022-06-02 LAB — HEMOGLOBIN A1C: Hgb A1c MFr Bld: 5.7 % (ref 4.6–6.5)

## 2022-06-02 MED ORDER — BUPROPION HCL ER (XL) 150 MG PO TB24
150.0000 mg | ORAL_TABLET | Freq: Every day | ORAL | 1 refills | Status: DC
  Filled 2022-06-02 – 2022-08-02 (×2): qty 90, 90d supply, fill #0
  Filled 2022-11-27: qty 90, 90d supply, fill #1

## 2022-06-02 MED ORDER — CITALOPRAM HYDROBROMIDE 40 MG PO TABS
40.0000 mg | ORAL_TABLET | Freq: Every day | ORAL | 1 refills | Status: DC
  Filled 2022-06-02 – 2022-06-23 (×2): qty 90, 90d supply, fill #0
  Filled 2022-10-12: qty 90, 90d supply, fill #1

## 2022-06-02 MED ORDER — LOSARTAN POTASSIUM 25 MG PO TABS
25.0000 mg | ORAL_TABLET | Freq: Every day | ORAL | 3 refills | Status: AC
  Filled 2022-06-02: qty 90, fill #0
  Filled 2022-08-10: qty 90, 90d supply, fill #0
  Filled 2022-12-12: qty 90, 90d supply, fill #1

## 2022-06-02 MED ORDER — TRAZODONE HCL 50 MG PO TABS
50.0000 mg | ORAL_TABLET | Freq: Every evening | ORAL | 1 refills | Status: DC
  Filled 2022-06-02 – 2022-07-25 (×3): qty 180, 90d supply, fill #0
  Filled 2022-10-24: qty 180, 90d supply, fill #1

## 2022-06-02 MED ORDER — AZITHROMYCIN 250 MG PO TABS
ORAL_TABLET | ORAL | 0 refills | Status: AC
  Filled 2022-06-02: qty 6, 5d supply, fill #0

## 2022-06-02 MED ORDER — ATORVASTATIN CALCIUM 10 MG PO TABS
10.0000 mg | ORAL_TABLET | Freq: Every day | ORAL | 3 refills | Status: AC
  Filled 2022-06-02 – 2022-08-10 (×2): qty 90, 90d supply, fill #0
  Filled 2022-12-12: qty 90, 90d supply, fill #1

## 2022-06-02 NOTE — Progress Notes (Signed)
Subjective:     Patient ID: Robert Bowen, male    DOB: 01-06-1956, 67 y.o.   MRN: [id number]  Chief Complaint  Patient presents with   Follow-up    6 month follow-up for moods Fasting     HPI-here w/wife  3 days-congested, coughing.  No f/c.  +HA, runny nose, sore throat.  No sob.  Low energy DM-doing well-sees endo next wk.   3.  Depression-doing better on wellbutrin.  On celexa.  Not as angry no SI.  4. Glaucoma-glare outdoors and some HA 5.  HTN-Pt is on losartan 25.  No/dizziness/cp/palp/edema/cough/sob(except URI) 6.  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 Added automatically from request for surgery [id number]   Ruptured globe of left eye 12/31/2019   Formatting of this note might be different from the original. 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 Last Assessment & Plan:  Start Meloxicam. Supportive measures reviewed. If not improving we will need further assessment.   Thrombocytopenia ([place]) 04/21/2019   Type 2 diabetes mellitus with hyperglycemia, without long-term current use of insulin ([place]) 06/26/2019   Type 2 diabetes mellitus with proliferative retinopathy of left eye, without long-term current use of insulin ([place]) 06/26/2019   Uncontrolled type 2 diabetes mellitus with hyperglycemia ([place]) 04/18/2019   Vitreous prolapse, left eye 08/12/2018   Formatting of this note might be different from the original. Added automatically from request for surgery [id number]  Formatting of this note might be different from the original. Added automatically from request for surgery [id number]    Past Surgical History:  Procedure Laterality Date   EYE SURGERY Left    x4    Outpatient Medications Prior to Visit  Medication Sig Dispense Refill   acetaminophen (TYLENOL) 325 MG tablet Take by mouth.      B Complex-Folic Acid (B COMPLEX VITAMINS, W/ FA,) CAPS Take 1 capsule by mouth daily.     Continuous Blood Gluc Sensor (FREESTYLE LIBRE 2 SENSOR) MISC Apply 1 sensor every 14 days. 6 each 3   dapagliflozin propanediol (FARXIGA) 10 MG TABS tablet Take 1 tablet (10 mg total) by mouth daily. 90 tablet 3   dorzolamide-timolol (COSOPT) 22.3-6.8 MG/ML ophthalmic solution Place 1 drop into the left eye 2 times daily. 30 mL 11   metFORMIN (GLUCOPHAGE-XR) 750 MG 24 hr tablet Take 1 tablet by mouth 2  times daily with a meal. 180 tablet 3   Multiple Vitamin (MULTIVITAMIN) tablet Take 1 tablet by mouth daily.     prednisoLONE acetate (PRED FORTE) 1 % ophthalmic suspension Place 1 drop into both eyes in the morning and at bedtime. 15 mL 11   valACYclovir (VALTREX) 1000 MG tablet Take 1 tablet by mouth once daily. 90 tablet 5   atorvastatin (LIPITOR) 10 MG tablet Take by mouth.     buPROPion (WELLBUTRIN XL) 150 MG 24 hr tablet Take 1 tablet (150 mg total) by mouth daily. 90 tablet 1   citalopram (CELEXA) 40 MG tablet Take 1 tablet (40 mg total) by mouth daily. 90 tablet 0   losartan (COZAAR) 25 MG tablet TAKE 1 TABLET BY MOUTH ONCE DAILY 90 tablet 2   traZODone (DESYREL) 50 MG tablet Take 1-2 tablets (50-100 mg total) by mouth at bedtime for sleep. 180 tablet 1   atorvastatin (LIPITOR) 20 MG tablet Take 1 tablet (20 mg total) by mouth daily. 90 tablet 3   brimonidine (ALPHAGAN) 0.2 % ophthalmic solution Place 1 drop into the left eye 2 times daily. 15 mL 11   brimonidine (ALPHAGAN) 0.2 % ophthalmic solution Place 1 drop into the left eye 2 times daily. 15 mL 11   prednisoLONE acetate (PRED FORTE) 1 % ophthalmic suspension Place 1 drop into both eyes in the morning and at bedtime. 15 mL 11   valACYclovir (VALTREX) 1000 MG tablet Take 1 tablet by mouth daily. 90 tablet 5   No facility-administered medications prior to visit.    No Known Allergies ROS neg/noncontributory except as noted HPI/below Occ  HA Hearing loss-poss "slow growth"       Objective:     BP 130/60   Pulse 82   Temp 97.9 F (36.6 C) (Temporal)   Ht '6\' 1"'$  (1.854 m)   Wt 175 lb 2 oz (79.4 kg)   SpO2 98%   BMI 23.10 kg/m  Wt Readings from Last 3 Encounters:  06/02/22 175 lb 2 oz (79.4 kg)  03/03/22 172 lb (78 kg)  02/01/22 176 lb 0.6 oz (79.9 kg)    Physical Exam   Gen: WDWN NAD HEENT: NCAT,conjuctiva opaque TM WNL B, OP moist, no exudates .  Some congestion NECK:  supple, no thyromegaly, no nodes, no carotid bruits CARDIAC: RRR, S1S2+, no murmur. DP 2+B LUNGS: CTAB. No wheezes ABDOMEN:  BS+, soft, NTND, No HSM, no masses EXT:  no edema MSK: no gross abnormalities.  NEURO: A&O x3.  CN II-XII intact.  PSYCH: normal mood. Good eye contact  Results for orders placed or performed in visit on 06/02/22  POC COVID-19  Result Value Ref Range   SARS Coronavirus 2 Ag Negative Negative        Assessment & Plan:   Problem List Items Addressed This Visit       Cardiovascular and Mediastinum   Primary hypertension   Relevant Medications   atorvastatin (LIPITOR) 10 MG tablet   losartan (COZAAR) 25 MG tablet   Other Relevant Orders   Comprehensive metabolic panel   TSH   CBC with Differential/Platelet   Microalbumin / creatinine urine ratio     Endocrine   Uncontrolled type 2 diabetes mellitus with hyperglycemia (HCC) - Primary   Relevant Medications   atorvastatin (LIPITOR) 10 MG tablet   losartan (COZAAR) 25 MG tablet   Other Relevant Orders   Comprehensive metabolic panel   Hemoglobin A1c   Vitamin B12     Other   Anxiety and depression   Relevant Medications   buPROPion (WELLBUTRIN XL) 150 MG 24 hr tablet   citalopram (CELEXA) 40 MG tablet   traZODone (DESYREL) 50 MG tablet   Nonintractable headache   Relevant Medications   acetaminophen (TYLENOL) 325 MG tablet   buPROPion (WELLBUTRIN XL) 150 MG 24 hr tablet   citalopram (CELEXA) 40 MG tablet   traZODone (DESYREL) 50 MG tablet    Other Relevant Orders   POC COVID-19 (Completed)   Mixed hyperlipidemia   Relevant Medications   atorvastatin (LIPITOR) 10 MG tablet   losartan (COZAAR) 25 MG tablet   Other Relevant Orders   TSH   Lipid panel   Insomnia   Relevant Medications   traZODone (DESYREL) 50 MG tablet   Other Visit Diagnoses     Encounter for hepatitis C screening test for low risk patient       Relevant Orders   Hepatitis C antibody   Cough, unspecified type       Relevant Orders   POC COVID-19 (Completed)      DM type 2 w-chronic.  Controlled.  Sees endo.  Care per them.  Check cmp,A1C, microalb/creat ratio, B12 d/t metformin, TSH HTN-chronic.  Controlled on losartan '25mg'$ -cont.  Check cmp,urine microalb/creat ratio, cbc HLD-chronic.  Controlled. Cont atorvastatin '10mg'$ .  Check lipids, cmp. Depression-chronic.  Better controlled.  Cont citalopram '40mg'$  and trazodone 50-'100mg'$  and wellbutrin xl '150mg'$ .   URI-mult co-morbidities-zpk to hold if worsening.   Meds ordered this encounter  Medications   buPROPion (WELLBUTRIN XL) 150 MG 24 hr tablet    Sig: Take 1 tablet (150 mg total) by mouth daily.    Dispense:  90 tablet    Refill:  1   citalopram (CELEXA) 40 MG tablet    Sig: Take 1 tablet (40 mg total) by mouth daily.    Dispense:  90 tablet    Refill:  1   atorvastatin (LIPITOR) 10 MG tablet    Sig: Take 1 tablet (10 mg total) by mouth daily.    Dispense:  90 tablet    Refill:  3   losartan (COZAAR) 25 MG tablet    Sig: TAKE 1 TABLET BY MOUTH ONCE DAILY    Dispense:  90 tablet    Refill:  3   traZODone (DESYREL) 50 MG tablet    Sig: Take 1-2 tablets (50-100 mg total) by mouth at bedtime for sleep.    Dispense:  180 tablet    Refill:  1   azithromycin (ZITHROMAX) 250 MG tablet    Sig: Take  2 tablets on day 1, then 1 tablet daily on days 2 through 5    Dispense:  6 tablet    Refill:  0    Wellington Hampshire, MD

## 2022-06-02 NOTE — Patient Instructions (Signed)
It was very nice to see you today!  Stay active   PLEASE NOTE:  If you had any lab tests please let us know if you have not heard back within a few days. You may see your results on MyChart before we have a chance to review them but we will give you a call once they are reviewed by [place]. If we ordered any referrals today, please let us know if you have not heard from their office within the next week.   Please try these tips to maintain a healthy lifestyle:  Eat most of your calories during the day when you are active. Eliminate processed foods including packaged sweets (pies, cakes, cookies), reduce intake of potatoes, white bread, white pasta, and white rice. Look for whole grain options, oat flour or almond flour.  Each meal should contain half fruits/vegetables, one quarter protein, and one quarter carbs (no bigger than a computer mouse).  Cut down on sweet beverages. This includes juice, soda, and sweet tea. Also watch fruit intake, though this is a healthier sweet option, it still contains natural sugar! Limit to 3 servings daily.  Drink at least 1 glass of water with each meal and aim for at least 8 glasses per day  Exercise at least 150 minutes every week.

## 2022-06-03 ENCOUNTER — Other Ambulatory Visit (HOSPITAL_COMMUNITY): Payer: Self-pay

## 2022-06-03 LAB — HEPATITIS C ANTIBODY: Hepatitis C Ab: NONREACTIVE

## 2022-06-04 NOTE — Progress Notes (Signed)
1.  Wow!  Sugars are awesome!  Is he getting any low sugars?  Will defer any changes to endocrinology 2.  Other labs are stable/at goal. 3.  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  Has he seen hematology about this?  Or is anybody said why they are low?

## 2022-06-05 NOTE — Progress Notes (Unsigned)
Name: Robert Bowen  Age/ Sex: 67 y.o., male   MRN/ DOB: [id number], Dec 23, 1955     PCP: Tawnya Crook, MD   Reason for Endocrinology Evaluation: Type 2 Diabetes Mellitus  Initial Endocrine Consultative Visit: 06/26/2019    PATIENT IDENTIFIER: Robert Bowen is a 67 y.o. male with a past medical history of T2DM, HTN and Congenital right eye blindness. The patient has followed with Endocrinology clinic since 06/26/2019 for consultative assistance with management of his diabetes.  DIABETIC HISTORY:  Robert Bowen was diagnosed with T2DM in 1988. He was on glyburide in the past. He has not bee on insulin  His hemoglobin A1c has ranged from 6.6% in 2018, peaking at 11.0%in 2020.   On his initial visit to our clinic , his A1c was 7.4% , he was on metformin and Wilder Glade ( through pt assistance program), we added glipizide   By his visit in February 2023 he had self discontinued glipizide with an A1c of 6.5%    SUBJECTIVE:   During the last visit (11/28/2021): A1c 6.1 %.   Today (06/06/2022): Robert Bowen is here for a follow up on diabetes management. He checks his blood sugars multiple times a day  . The patient has not  had hypoglycemic episodes since the last clinic visit.   Have a 40 and 33 yr old grand kids living with them   Denies nausea or vomiting or diarrhea  Denies genital infection    HOME DIABETES REGIMEN:  Metformin 750 mg Twice daily with meal Farxiga 10 mg daily with Breakfast      Statin: yes ACE-I/ARB: yes    CONTINUOUS GLUCOSE MONITORING RECORD INTERPRETATION    Dates of Recording: 2/21-06/06/2022  Sensor description:freestyle  Results statistics:   CGM use % of time 48  Average and SD 142/30.1  Time in range   80   %  % Time Above 180 19  % Time above 250 1  % Time Below target 0       Glycemic patterns summary: BG's optimal during the  night and trend upwards during the day   Hyperglycemic episodes  postprandial   Hypoglycemic  episodes occurred n/a  Overnight periods: optimal       DIABETIC COMPLICATIONS: Microvascular complications:  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 , congenital right eye blindness , neuropathy  Denies: CKD Last Eye Exam: Completed 08/15/2021  Macrovascular complications:   Denies: CAD, CVA, PVD   HISTORY:  Past Medical History:  Past Medical History:  Diagnosis Date   Acute frontal sinusitis 02/09/2021   Anterior capsular phimosis of lens 06/17/2018   Anxiety and depression 03/02/2016   on meds   Bradycardia 01/27/2021   Childhood cataract, bilateral 01/11/2012   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   Controlled type 2 diabetes mellitus without complication, without long-term current use of insulin ([place]) 03/02/2016   Depression, major, single episode, in partial remission ([place]) 01/15/2018   Diabetes mellitus ([place]) 01/11/2012   Diabetes mellitus due to underlying condition with unspecified complications ([place]) [id number]   Diabetes mellitus, type 2 ([facility]) 1988   on meds   Diastasis recti 01/27/2021   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 Added automatically from request for surgery [id number]   Ruptured globe of left eye 12/31/2019   Formatting of this note might be different from the original. 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 Last Assessment & Plan:  Start Meloxicam. Supportive measures reviewed. If not improving we will need further assessment.   Thrombocytopenia ([place]) 04/21/2019   Type 2 diabetes mellitus with hyperglycemia, without long-term current use of insulin ([place]) 06/26/2019   Type 2 diabetes mellitus with proliferative retinopathy of left eye, without long-term current use of insulin ([place]) 06/26/2019   Uncontrolled type 2 diabetes mellitus with hyperglycemia ([place]) 04/18/2019   Vitreous prolapse, left eye 08/12/2018   Formatting of this note might be different from the original. Added automatically from request for surgery [id number]  Formatting of this note might be different from the original. Added automatically from request for surgery [id number]   Past Surgical History:  Past Surgical History:  Procedure Laterality Date   EYE SURGERY Left    x4   Social History:  reports that he has never smoked. He has never used smokeless tobacco. He reports current alcohol use of about 2.0 standard drinks of alcohol per week. He reports that he does not use drugs. Family History:  Family History  Problem Relation Age of Onset   Liver cancer Mother    Diabetes type II Father    Heart disease Father    Colon cancer Neg Hx    Esophageal cancer Neg Hx    Stomach cancer Neg Hx    Rectal cancer Neg Hx    Colon polyps Neg Hx       HOME MEDICATIONS: Allergies as of 06/06/2022   No Known Allergies      Medication List        Accurate as of June 06, 2022  9:45 AM. If you have any questions, ask your nurse or doctor.          acetaminophen 325 MG tablet Commonly known as: TYLENOL Take by mouth.   atorvastatin 10 MG tablet Commonly known as: LIPITOR Take 1 tablet (10 mg total) by mouth daily.   azithromycin 250 MG tablet Commonly known as: ZITHROMAX Take 2 tablets on day 1, then 1 tablet daily on days 2 through 5   B Complex Vitamins (w/ FA) Caps Take 1 capsule by mouth daily.   buPROPion 150 MG 24 hr tablet Commonly known as: Wellbutrin XL Take 1 tablet (150 mg total) by mouth daily.   citalopram 40 MG tablet Commonly known as: CELEXA Take 1 tablet (40 mg total) by mouth daily.   dapagliflozin propanediol 10 MG Tabs  tablet Commonly known as: FARXIGA Take 1 tablet (10 mg total) by mouth daily.   dorzolamide-timolol 2-0.5 % ophthalmic solution Commonly known as: COSOPT Place 1 drop into the left eye 2 times daily.   FreeStyle Libre 2 Sensor Misc Apply 1 sensor every 14 days.   losartan 25 MG tablet Commonly known as: COZAAR Take 1 tablet (25 mg total) by mouth daily.   metFORMIN 750 MG 24 hr tablet Commonly known as: GLUCOPHAGE-XR Take 1 tablet by mouth 2  times daily with a meal.   multivitamin tablet Take 1 tablet by mouth daily.   prednisoLONE acetate 1 % ophthalmic suspension Commonly known as: PRED FORTE Place 1 drop into both eyes in the morning and at bedtime.   traZODone 50 MG tablet Commonly known as: DESYREL Take 1-2 tablets (50-100 mg total) by mouth at bedtime for sleep.   valACYclovir 1000 MG tablet Commonly known as: VALTREX Take 1 tablet by mouth once daily.         OBJECTIVE:   Vital Signs: BP 120/70 (BP Location: Left Arm, Patient Position: Sitting, Cuff Size: Small)   Pulse 63   Ht '6\' 1"'$  (1.854 m)   Wt 171 lb (77.6 kg)   SpO2 97%   BMI 22.56  kg/m   Wt Readings from Last 3 Encounters:  06/06/22 171 lb (77.6 kg)  06/02/22 175 lb 2 oz (79.4 kg)  03/03/22 172 lb (78 kg)     Exam: General: Pt appears well and is in NAD  Lungs: Clear with good BS bilat   Heart: RRR   Extremities: No pretibial edema.  Neuro: MS is good with appropriate affect, pt is alert and Ox3    DM foot exam: 06/06/2022   The skin of the feet is without sores or ulcerations. The pedal pulses are 1+ on right and 1+ on left. The sensation is intact to a screening 5.07, 10 gram monofilament on the right    DATA REVIEWED:  Lab Results  Component Value Date   HGBA1C 5.7 06/02/2022   HGBA1C 6.1 (A) 11/29/2021   HGBA1C 6.5 (A) 05/31/2021    Latest Reference Range & Units 06/02/22 10:34  Sodium 135 - 145 mEq/L 138  Potassium 3.5 - 5.1 mEq/L 4.3  Chloride 96 - 112 mEq/L 100  CO2 19 - 32 mEq/L 30  Glucose 70 - 99 mg/dL 116 (H)  BUN 6 - 23 mg/dL 14  Creatinine 0.40 - 1.50 mg/dL 0.90  Calcium 8.4 - 10.5 mg/dL 9.5  Alkaline Phosphatase 39 - 117 U/L 73  Albumin 3.5 - 5.2 g/dL 4.4  AST 0 - 37 U/L 18  ALT 0 - 53 U/L 19  Total Protein 6.0 - 8.3 g/dL 7.0  Total Bilirubin 0.2 - 1.2 mg/dL 1.2  GFR >60.00 mL/min 88.36  Total CHOL/HDL Ratio  3  Cholesterol 0 - 200 mg/dL 114  HDL Cholesterol >39.00 mg/dL 37.20 (L)  LDL (calc) 0 - 99 mg/dL 53  MICROALB/CREAT RATIO 0.0 - 30.0 mg/g 1.9  NonHDL  76.88  Triglycerides 0.0 - 149.0 mg/dL 118.0  VLDL 0.0 - 40.0 mg/dL 23.6    Latest Reference Range & Units 06/02/22 10:34  Creatinine,U mg/dL 37.5  Microalb, Ur 0.0 - 1.9 mg/dL <0.7  MICROALB/CREAT RATIO 0.0 - 30.0 mg/g 1.9    ASSESSMENT / PLAN / RECOMMENDATIONS:   1) Type 2 Diabetes Mellitus, Optimally controlled, With retinopathic complications - Most recent A1c of 5.7%. Goal A1c <7.0%.     - A1c continues to  be at goal  -He is on patient assistance program for [employer], he did his portion online ~ 3 weeks ago, and today I filled the provider portion  - Pt  was given the option to decrease Metformin but he would like to continue at this time    MEDICATIONS: - Continue Metformin 750 mg Twice daily with meals - Continue Farxiga 10 mg daily with Breakfast     EDUCATION / INSTRUCTIONS: BG monitoring instructions: Patient is instructed to check his blood sugars 1 times a day, fasting  Call [facility] if: BG persistently < 70 . I reviewed the Rule of 15 for the treatment of hypoglycemia in detail with the patient. Literature supplied.   2) Diabetic complications:  Eye: Does have known diabetic retinopathy.  Neuro/ Feet: Does not have known diabetic peripheral neuropathy .  Renal: Patient does not have known baseline CKD. He   is on an ACEI/ARB at present.      F/U in 6 months    Signed electronically by: Mack Guise, MD  [facility]  [facility] [address] [place],  [address] Phone: [phone] FAX: [phone]   CC: Tawnya Crook, [address] [place] [address] Phone: [phone]  Fax: [phone]  Return to Endocrinology clinic as below: Future Appointments  Date Time Provider [place]  12/06/2022  9:30 AM Tawnya Crook, MD [facility]  03/09/2023 10:00 AM [facility] HEALTH COACH [facility]

## 2022-06-06 ENCOUNTER — Ambulatory Visit: Payer: Medicare Other | Admitting: Internal Medicine

## 2022-06-06 ENCOUNTER — Encounter: Payer: Self-pay | Admitting: Internal Medicine

## 2022-06-06 ENCOUNTER — Other Ambulatory Visit (HOSPITAL_COMMUNITY): Payer: Self-pay

## 2022-06-06 DIAGNOSIS — E1139 Type 2 diabetes mellitus with other diabetic ophthalmic complication: Secondary | ICD-10-CM | POA: Diagnosis not present

## 2022-06-06 DIAGNOSIS — Z794 Long term (current) use of insulin: Secondary | ICD-10-CM

## 2022-06-06 MED ORDER — METFORMIN HCL ER 750 MG PO TB24
750.0000 mg | ORAL_TABLET | Freq: Two times a day (BID) | ORAL | 3 refills | Status: AC
  Filled 2022-06-06: qty 180, 90d supply, fill #0
  Filled 2022-10-24: qty 180, 90d supply, fill #1

## 2022-06-06 NOTE — Patient Instructions (Signed)
-   Continue Metformin 750 mg Twice daily with meals - Continue Farxiga 10 mg daily with Breakfast     HOW TO TREAT LOW BLOOD SUGARS (Blood sugar LESS THAN 70 MG/DL) Please follow the RULE OF 15 for the treatment of hypoglycemia treatment (when your (blood sugars are less than 70 mg/dL)   STEP 1: Take 15 grams of carbohydrates when your blood sugar is low, which includes:  3-4 GLUCOSE TABS  OR 3-4 OZ OF JUICE OR REGULAR SODA OR ONE TUBE OF GLUCOSE GEL    STEP 2: RECHECK blood sugar in 15 MINUTES STEP 3: If your blood sugar is still low at the 15 minute recheck --> then, go back to STEP 1 and treat AGAIN with another 15 grams of carbohydrates.

## 2022-06-23 ENCOUNTER — Other Ambulatory Visit (HOSPITAL_COMMUNITY): Payer: Self-pay

## 2022-06-24 ENCOUNTER — Other Ambulatory Visit (HOSPITAL_COMMUNITY): Payer: Self-pay

## 2022-06-26 ENCOUNTER — Other Ambulatory Visit (HOSPITAL_COMMUNITY): Payer: Self-pay

## 2022-07-12 ENCOUNTER — Other Ambulatory Visit (HOSPITAL_COMMUNITY): Payer: Self-pay

## 2022-07-12 DIAGNOSIS — S0531XD Ocular laceration without prolapse or loss of intraocular tissue, right eye, subsequent encounter: Secondary | ICD-10-CM | POA: Diagnosis not present

## 2022-07-12 DIAGNOSIS — H44521 Atrophy of globe, right eye: Secondary | ICD-10-CM | POA: Diagnosis not present

## 2022-07-12 DIAGNOSIS — Z947 Corneal transplant status: Secondary | ICD-10-CM | POA: Diagnosis not present

## 2022-07-12 DIAGNOSIS — H40052 Ocular hypertension, left eye: Secondary | ICD-10-CM | POA: Diagnosis not present

## 2022-07-12 MED ORDER — DORZOLAMIDE HCL-TIMOLOL MAL 2-0.5 % OP SOLN
1.0000 [drp] | Freq: Two times a day (BID) | OPHTHALMIC | 11 refills | Status: AC
  Filled 2022-07-12: qty 10, 80d supply, fill #0

## 2022-07-12 MED ORDER — PREDNISOLONE ACETATE 1 % OP SUSP
1.0000 [drp] | Freq: Two times a day (BID) | OPHTHALMIC | 11 refills | Status: AC
  Filled 2022-07-12: qty 5, 20d supply, fill #0

## 2022-07-12 MED ORDER — BRIMONIDINE TARTRATE 0.2 % OP SOLN
1.0000 [drp] | Freq: Two times a day (BID) | OPHTHALMIC | 11 refills | Status: AC
  Filled 2022-07-12 – 2022-07-13 (×2): qty 5, 50d supply, fill #0

## 2022-07-12 MED ORDER — VALACYCLOVIR HCL 1 G PO TABS
ORAL_TABLET | ORAL | 5 refills | Status: AC
  Filled 2022-07-12: qty 30, 30d supply, fill #0

## 2022-07-13 ENCOUNTER — Other Ambulatory Visit (HOSPITAL_COMMUNITY): Payer: Self-pay

## 2022-07-17 ENCOUNTER — Other Ambulatory Visit (HOSPITAL_COMMUNITY): Payer: Self-pay

## 2022-07-25 ENCOUNTER — Other Ambulatory Visit: Payer: Self-pay | Admitting: Internal Medicine

## 2022-07-25 ENCOUNTER — Other Ambulatory Visit (HOSPITAL_COMMUNITY): Payer: Self-pay

## 2022-07-25 DIAGNOSIS — E1139 Type 2 diabetes mellitus with other diabetic ophthalmic complication: Secondary | ICD-10-CM

## 2022-07-25 MED ORDER — FREESTYLE LIBRE 2 SENSOR MISC
1.0000 | 3 refills | Status: AC
Start: 2022-07-25 — End: ?
  Filled 2022-07-25: qty 6, 84d supply, fill #0
  Filled 2022-10-12: qty 6, 84d supply, fill #1
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
## 2022-07-29 ENCOUNTER — Other Ambulatory Visit (HOSPITAL_COMMUNITY): Payer: Self-pay

## 2022-08-01 DIAGNOSIS — H918X3 Other specified hearing loss, bilateral: Secondary | ICD-10-CM | POA: Diagnosis not present

## 2022-08-01 DIAGNOSIS — H9190 Unspecified hearing loss, unspecified ear: Secondary | ICD-10-CM | POA: Diagnosis not present

## 2022-08-02 ENCOUNTER — Other Ambulatory Visit (HOSPITAL_COMMUNITY): Payer: Self-pay

## 2022-08-10 ENCOUNTER — Other Ambulatory Visit (HOSPITAL_COMMUNITY): Payer: Self-pay

## 2022-08-10 DIAGNOSIS — H548 Legal blindness, as defined in USA: Secondary | ICD-10-CM | POA: Diagnosis not present

## 2022-08-10 DIAGNOSIS — H40052 Ocular hypertension, left eye: Secondary | ICD-10-CM | POA: Diagnosis not present

## 2022-10-03 DIAGNOSIS — E113412 Type 2 diabetes mellitus with severe nonproliferative diabetic retinopathy with macular edema, left eye: Secondary | ICD-10-CM | POA: Diagnosis not present

## 2022-10-03 DIAGNOSIS — Z7984 Long term (current) use of oral hypoglycemic drugs: Secondary | ICD-10-CM | POA: Diagnosis not present

## 2022-10-03 DIAGNOSIS — Z947 Corneal transplant status: Secondary | ICD-10-CM | POA: Diagnosis not present

## 2022-10-03 DIAGNOSIS — H548 Legal blindness, as defined in USA: Secondary | ICD-10-CM | POA: Diagnosis not present

## 2022-10-03 DIAGNOSIS — S0531XD Ocular laceration without prolapse or loss of intraocular tissue, right eye, subsequent encounter: Secondary | ICD-10-CM | POA: Diagnosis not present

## 2022-10-03 DIAGNOSIS — H44521 Atrophy of globe, right eye: Secondary | ICD-10-CM | POA: Diagnosis not present

## 2022-10-03 DIAGNOSIS — H40052 Ocular hypertension, left eye: Secondary | ICD-10-CM | POA: Diagnosis not present

## 2022-10-12 ENCOUNTER — Other Ambulatory Visit (HOSPITAL_COMMUNITY): Payer: Self-pay

## 2022-10-24 ENCOUNTER — Other Ambulatory Visit (HOSPITAL_COMMUNITY): Payer: Self-pay

## 2022-11-16 ENCOUNTER — Encounter (INDEPENDENT_AMBULATORY_CARE_PROVIDER_SITE_OTHER): Payer: Self-pay

## 2022-11-27 ENCOUNTER — Other Ambulatory Visit (HOSPITAL_COMMUNITY): Payer: Self-pay

## 2022-11-30 ENCOUNTER — Other Ambulatory Visit: Payer: Self-pay

## 2022-11-30 DIAGNOSIS — E1139 Type 2 diabetes mellitus with other diabetic ophthalmic complication: Secondary | ICD-10-CM

## 2022-11-30 MED ORDER — DAPAGLIFLOZIN PROPANEDIOL 10 MG PO TABS
10.0000 mg | ORAL_TABLET | Freq: Every day | ORAL | 3 refills | Status: AC
Start: 2022-11-30 — End: ?

## 2022-12-06 ENCOUNTER — Other Ambulatory Visit (HOSPITAL_COMMUNITY): Payer: Self-pay

## 2022-12-06 ENCOUNTER — Encounter: Payer: Self-pay | Admitting: Family Medicine

## 2022-12-06 ENCOUNTER — Ambulatory Visit (INDEPENDENT_AMBULATORY_CARE_PROVIDER_SITE_OTHER): Payer: Medicare Other | Admitting: Family Medicine

## 2022-12-06 VITALS — BP 112/70 | HR 46 | Temp 97.7°F | Resp 18 | Ht 73.0 in | Wt 169.1 lb

## 2022-12-06 DIAGNOSIS — G4700 Insomnia, unspecified: Secondary | ICD-10-CM

## 2022-12-06 DIAGNOSIS — F419 Anxiety disorder, unspecified: Secondary | ICD-10-CM | POA: Diagnosis not present

## 2022-12-06 DIAGNOSIS — F32A Depression, unspecified: Secondary | ICD-10-CM | POA: Diagnosis not present

## 2022-12-06 DIAGNOSIS — I10 Essential (primary) hypertension: Secondary | ICD-10-CM | POA: Diagnosis not present

## 2022-12-06 MED ORDER — TRAZODONE HCL 50 MG PO TABS
50.0000 mg | ORAL_TABLET | Freq: Every evening | ORAL | 1 refills | Status: AC
Start: 2022-12-06 — End: ?
  Filled 2022-12-06: qty 180, 90d supply, fill #0

## 2022-12-06 MED ORDER — BUPROPION HCL ER (XL) 150 MG PO TB24
150.0000 mg | ORAL_TABLET | Freq: Every day | ORAL | 1 refills | Status: DC
Start: 2022-12-06 — End: 2023-08-17
  Filled 2022-12-06: qty 90, 90d supply, fill #0

## 2022-12-06 MED ORDER — CITALOPRAM HYDROBROMIDE 40 MG PO TABS
40.0000 mg | ORAL_TABLET | Freq: Every day | ORAL | 1 refills | Status: AC
Start: 2022-12-06 — End: ?
  Filled 2022-12-06: qty 90, 90d supply, fill #0

## 2022-12-06 NOTE — Assessment & Plan Note (Signed)
 Chronic.  Well-controlled.  Continue losartan 25 mg daily

## 2022-12-06 NOTE — Progress Notes (Signed)
Subjective:     Patient ID: Robert Bowen, male    DOB: [birth year], 67 y.o.   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 States his moods have improved and is feeling happy. No SI  HTN - Pt is on losartan 25 mg daily.  Bp's running not checked .  No ha/dizziness/cp/palp/edema/cough/sob  DM - Managing well. States he is regularly exercising. Appointment with endocrinology by the end of the month.   Pt states he will be moving to [place].    Health Maintenance Due  Topic Date Due   HEMOGLOBIN A1C  12/03/2022    Past Medical History:  Diagnosis Date   Acute frontal sinusitis 02/09/2021   Anterior capsular phimosis of lens 06/17/2018   Anxiety and depression 03/02/2016   on meds   Bradycardia 01/27/2021   Childhood cataract, bilateral 01/11/2012   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   Controlled type 2 diabetes mellitus without complication, without long-term current use of insulin ([facility]) 03/02/2016   Depression, major, single episode, in partial remission ([facility]) 01/15/2018   Diabetes mellitus ([facility]) 01/11/2012   Diabetes mellitus due to underlying condition with unspecified complications ([facility]) 12/17/2019   Diabetes mellitus, type 2 ([facility]) 1988   on meds   Diastasis recti 01/27/2021   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 Added automatically from request for surgery [id number]   Ruptured globe of left eye 12/31/2019   Formatting of this note might be different from the original. 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 Last Assessment & Plan:  Start Meloxicam. Supportive measures reviewed. If not improving we will need further assessment.   Thrombocytopenia ([facility]) 04/21/2019   Type 2 diabetes mellitus with hyperglycemia, without long-term current use of insulin ([facility]) 06/26/2019   Type 2 diabetes mellitus with proliferative retinopathy of left eye, without long-term current use of insulin ([facility]) 06/26/2019   Uncontrolled type 2 diabetes mellitus with hyperglycemia ([facility]) 04/18/2019   Vitreous prolapse, left eye 08/12/2018   Formatting of this note might be different from the original. Added automatically from request for surgery [phone]  Formatting of this note might be different  from the original. Added automatically from request for surgery [phone]    Past Surgical History:  Procedure Laterality Date   EYE SURGERY Left    x4     Current Outpatient Medications:    acetaminophen (TYLENOL) 325 MG tablet, Take by mouth., Disp: , Rfl:    atorvastatin (LIPITOR) 10 MG tablet, Take 1 tablet (10 mg total) by mouth  daily., Disp: 90 tablet, Rfl: 3   B Complex-Folic Acid (B COMPLEX VITAMINS, W/ FA,) CAPS, Take 1 capsule by mouth daily., Disp: , Rfl:    brimonidine (ALPHAGAN) 0.2 % ophthalmic solution, Place 1 drop into the left eye 2 (two) times daily., Disp: 15 mL, Rfl: 11   Continuous Glucose Sensor (FREESTYLE LIBRE 2 SENSOR) MISC, Apply 1 sensor every 14 days., Disp: 6 each, Rfl: 3   dapagliflozin propanediol (FARXIGA) 10 MG TABS tablet, Take 1 tablet (10 mg total) by mouth daily., Disp: 90 tablet, Rfl: 3   dorzolamide-timolol (COSOPT) 2-0.5 % ophthalmic solution, Place 1 drop into the left eye 2 (two) times daily., Disp: 30 mL, Rfl: 11   dorzolamide-timolol (COSOPT) 22.3-6.8 MG/ML ophthalmic solution, Place 1 drop into the left eye 2 times daily., Disp: 30 mL, Rfl: 11   losartan (COZAAR) 25 MG tablet, Take 1 tablet (25 mg total) by mouth daily., Disp: 90 tablet, Rfl: 3   metFORMIN (GLUCOPHAGE-XR) 750 MG 24 hr tablet, Take 1 tablet by mouth 2  times daily with a meal., Disp: 180 tablet, Rfl: 3   Multiple Vitamin (MULTIVITAMIN) tablet, Take 1 tablet by mouth daily., Disp: , Rfl:    prednisoLONE acetate (PRED FORTE) 1 % ophthalmic suspension, Place 1 drop into both eyes in the morning and at bedtime., Disp: 15 mL, Rfl: 11   prednisoLONE acetate (PRED FORTE) 1 % ophthalmic suspension, Place 1 drop into both eyes 2 (two) times daily., Disp: 15 mL, Rfl: 11   valACYclovir (VALTREX) 1000 MG tablet, Take 1 tablet by mouth once daily., Disp: 90 tablet, Rfl: 5   valACYclovir (VALTREX) 1000 MG tablet, Take 1 tablet by mouth daily., Disp: 90 tablet, Rfl: 5   valACYclovir (VALTREX) 1000 MG tablet, Take 1 tablet (1,000 mg total) by mouth once a day for shingles., Disp: 90 tablet, Rfl: 5   buPROPion (WELLBUTRIN XL) 150 MG 24 hr tablet, Take 1 tablet (150 mg total) by mouth daily., Disp: 90 tablet, Rfl: 1   citalopram (CELEXA) 40 MG tablet, Take 1 tablet (40 mg total) by mouth daily., Disp: 90 tablet, Rfl: 1   traZODone  (DESYREL) 50 MG tablet, Take 1-2 tablets (50-100 mg total) by mouth at bedtime for sleep., Disp: 180 tablet, Rfl: 1  No Known Allergies ROS neg/noncontributory except as noted HPI/below      Objective:     BP 112/70   Pulse (!) 46   Temp 97.7 F (36.5 C) (Temporal)   Resp 18   Ht 6\' 1"  (1.854 m)   Wt 169 lb 2 oz (76.7 kg)   SpO2 99%   BMI 22.31 kg/m  Wt Readings from Last 3 Encounters:  12/06/22 169 lb 2 oz (76.7 kg)  06/06/22 171 lb (77.6 kg)  06/02/22 175 lb 2 oz (79.4 kg)    Physical Exam   Gen: WDWN NAD HEENT: NCAT, conjunctiva opaque R, sclera nonicteric  NECK:  supple, no thyromegaly, no nodes, no carotid bruits CARDIAC: RRR, S1S2+, no murmur. DP 2+B LUNGS: CTAB. No wheezes ABDOMEN:  BS+, soft, NTND, No HSM, no masses EXT:  no  edema MSK: no gross abnormalities.  NEURO: A&O x3.  CN II-XII intact.  PSYCH: normal mood. Good eye contact     Assessment & Plan:  Primary hypertension Assessment & Plan: Chronic.  Well-controlled.  Continue losartan 25 mg daily   Anxiety and depression Assessment & Plan: Chronic.  Well-controlled with citalopram 40 mg daily, Wellbutrin XL 150 mg daily  Orders: -     buPROPion HCl ER (XL); Take 1 tablet (150 mg total) by mouth daily.  Dispense: 90 tablet; Refill: 1 -     Citalopram Hydrobromide; Take 1 tablet (40 mg total) by mouth daily.  Dispense: 90 tablet; Refill: 1  Insomnia, unspecified type Assessment & Plan: Chronic.  Controlled.  Continue trazodone 50 to 100 mg nightly  Orders: -     traZODone HCl; Take 1-2 tablets (50-100 mg total) by mouth at bedtime for sleep.  Dispense: 180 tablet; Refill: 1   No follow-ups on file.  Germaine Pomfret Rice,acting as a scribe for Angelena Sole, MD.,have documented all relevant documentation on the behalf of Angelena Sole, MD,as directed by  Angelena Sole, MD while in the presence of Angelena Sole, MD.  I, Angelena Sole, MD, have reviewed all documentation for this visit. The documentation on  12/06/22 for the exam, diagnosis, procedures, and orders are all accurate and complete.   Angelena Sole, MD

## 2022-12-06 NOTE — Patient Instructions (Signed)

## 2022-12-06 NOTE — Assessment & Plan Note (Addendum)
Chronic.  Well-controlled with citalopram 40 mg daily, Wellbutrin XL 150 mg daily

## 2022-12-06 NOTE — Assessment & Plan Note (Signed)
Chronic.  Controlled.  Continue trazodone 50 to 100 mg nightly

## 2022-12-12 ENCOUNTER — Other Ambulatory Visit (HOSPITAL_COMMUNITY): Payer: Self-pay

## 2022-12-12 ENCOUNTER — Ambulatory Visit: Payer: Medicare Other | Admitting: Internal Medicine

## 2023-01-11 ENCOUNTER — Other Ambulatory Visit (HOSPITAL_COMMUNITY): Payer: Self-pay

## 2023-01-29 ENCOUNTER — Encounter: Payer: Self-pay | Admitting: Pharmacist

## 2023-01-29 NOTE — Progress Notes (Signed)
Pharmacy Quality Measure Review  This patient is appearing on a report for being at risk of failing the adherence measure for diabetes medications this calendar year.   Medication: metformin XR 750mg  - take 1 tablet twice a day Last fill date: 10/24/2022 for 90 day supply or #180 tablets.   There was a notation in his last appointment with Dr Lonzo Cloud in March 2024: "Pt was given the option to decrease Metformin but he would like to continue at this time   MEDICATIONS: - Continue Metformin 750 mg Twice daily with meals - Continue Farxiga 10 mg daily with Breakfast"  Attempted to contact Robert Bowen to verify current dose of metformin.   Left voicemail for patient to return my call at their convenience.  [name], PharmD Clinical Pharmacist [facility]  Population Health [phone]

## 2023-02-06 ENCOUNTER — Other Ambulatory Visit (HOSPITAL_COMMUNITY): Payer: Self-pay

## 2023-03-09 IMAGING — CT CT MAXILLOFACIAL W/O CM
2 series · 15 of 37 positions shown, 18 images · non-contrast
Comparison: CT head 05/15/2018

CLINICAL DATA: Maxillofacial pain Failed course of antibiotics for
clinical sinusitis. Persistent frontal headache and left ear hearing
loss.

EXAM:
CT MAXILLOFACIAL WITHOUT CONTRAST
TECHNIQUE: Multidetector CT imaging of the maxillofacial structures was
performed. Multiplanar CT image reconstructions were also generated.

[Series 4: sinus soft · axial · 0.38mm/px · z∈[-185,-59]mm · 12 of 73 slices shown, 15 images (1 of 2)]
[im 5/73  brain]
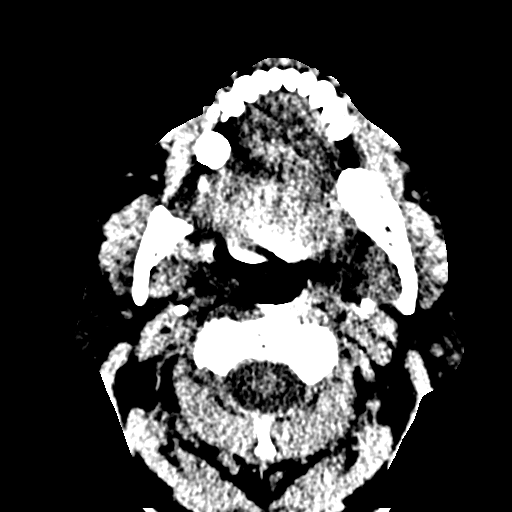
[im 5/73  bone]
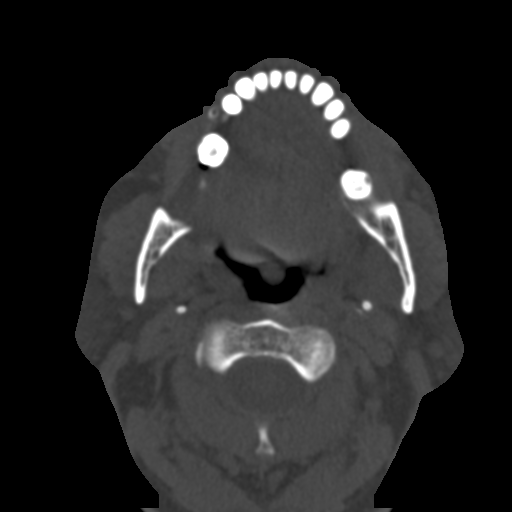
[im 10/73  bone]
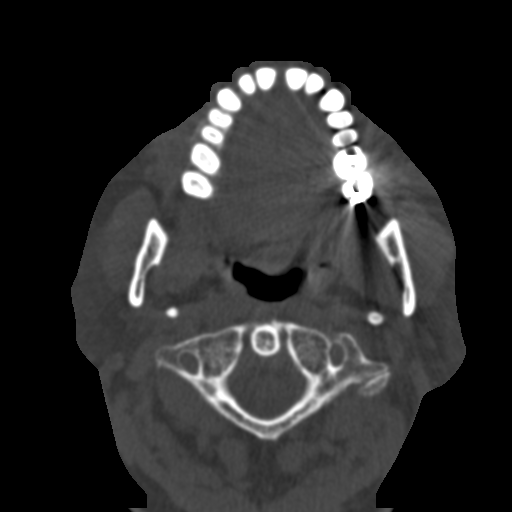
[im 15/73  bone]
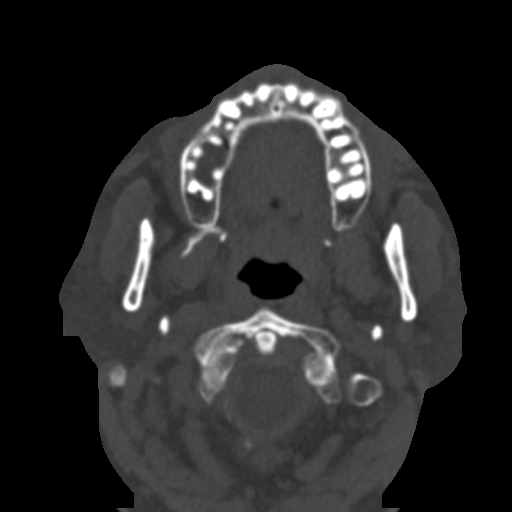
[im 23/73  bone]
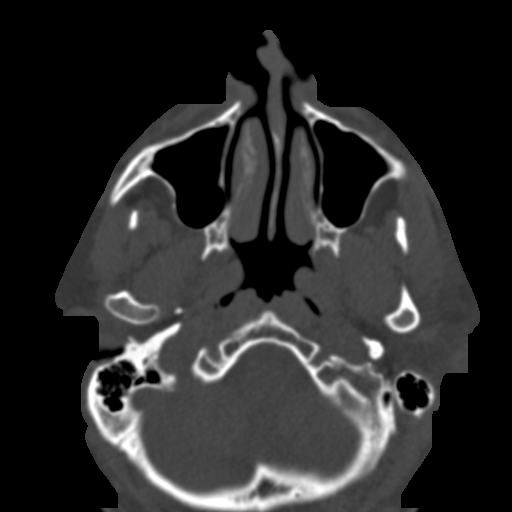
[im 28/73  brain]
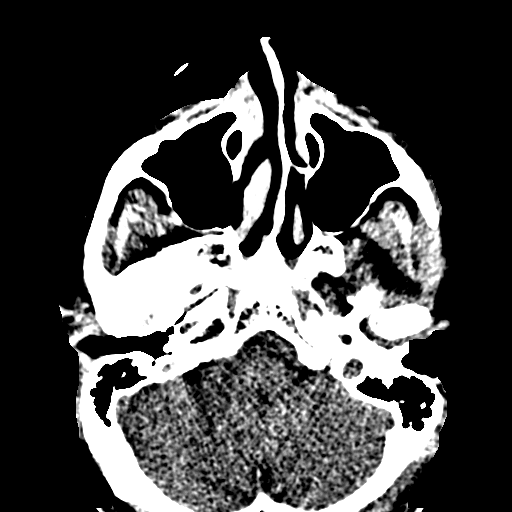
[im 28/73  bone]
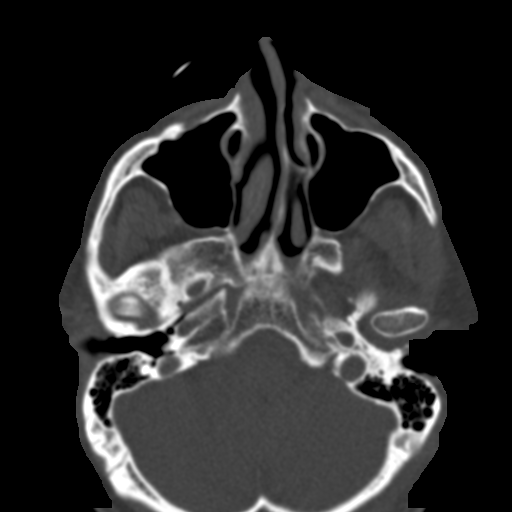
[im 33/73  bone]
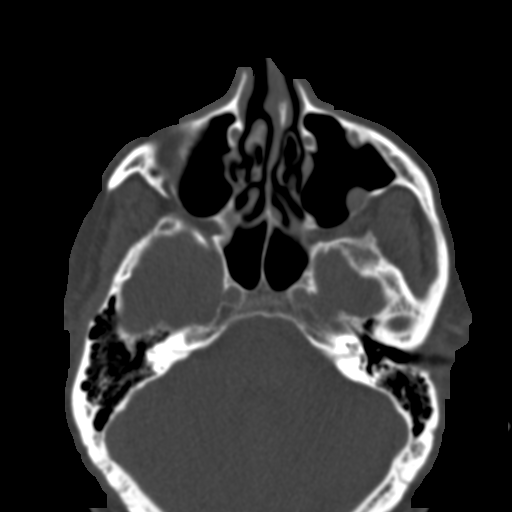
[im 40/73  bone]
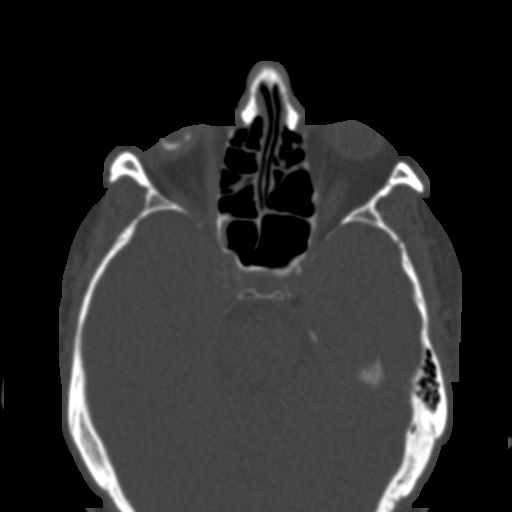
[im 45/73  bone]
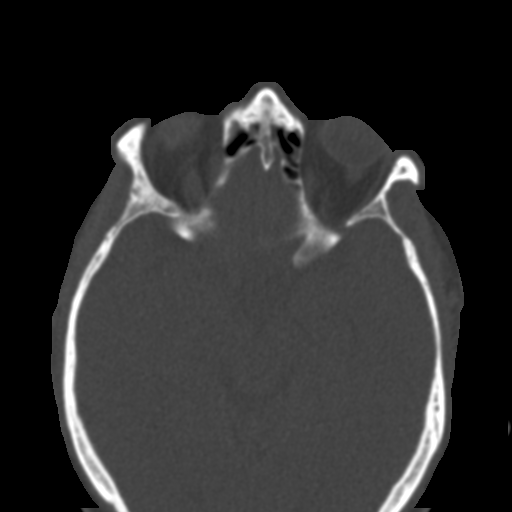
[im 50/73  brain]
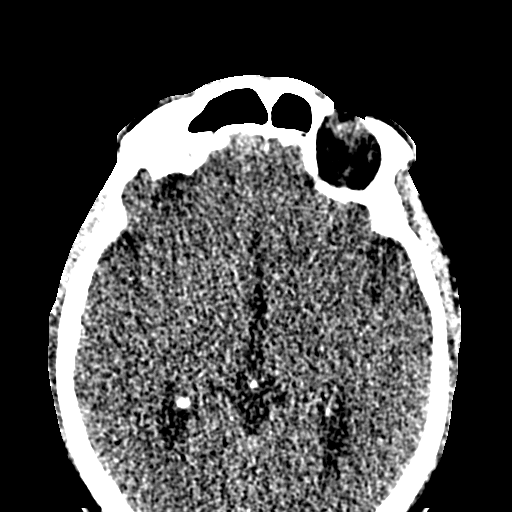
[im 50/73  bone]
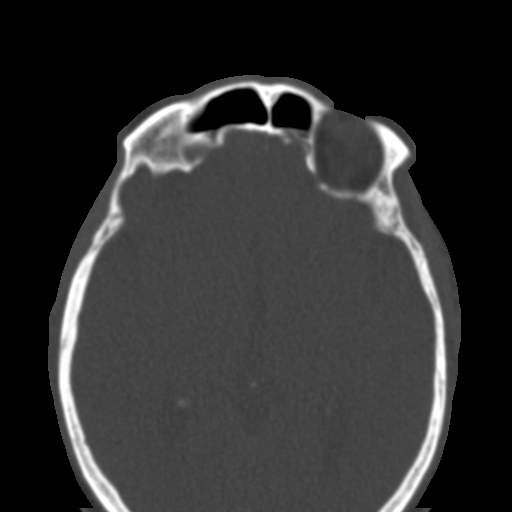
[im 58/73  bone]
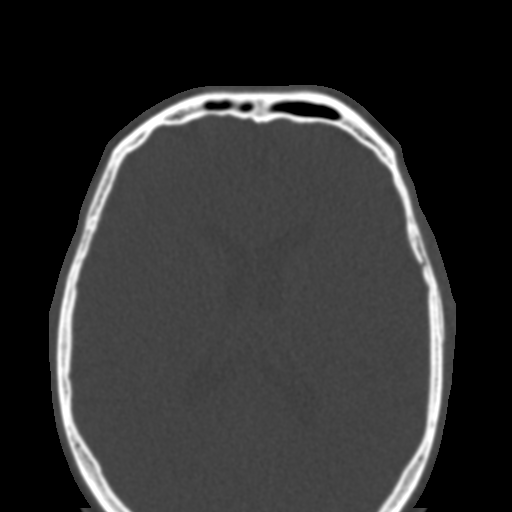
[im 63/73  bone]
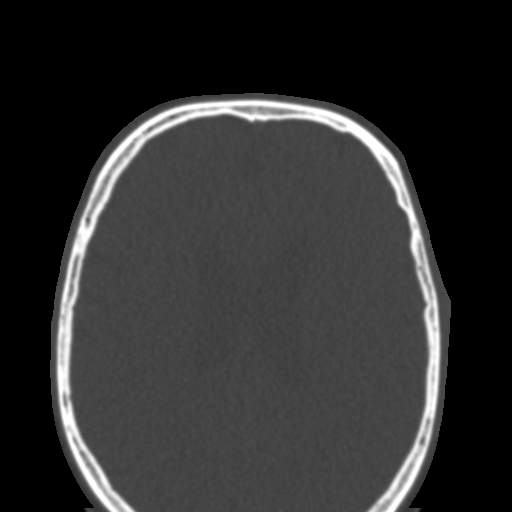
[im 68/73  bone]
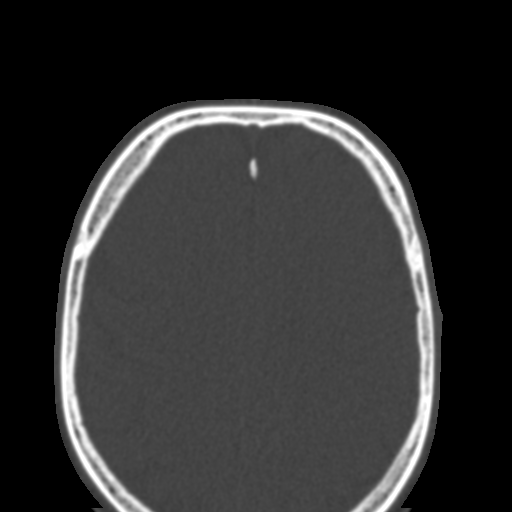

[Series 8: sinus soft · sagittal · 0.28mm/px · 3 of 101 slices shown (2 of 2)]
[im 34/101  bone]
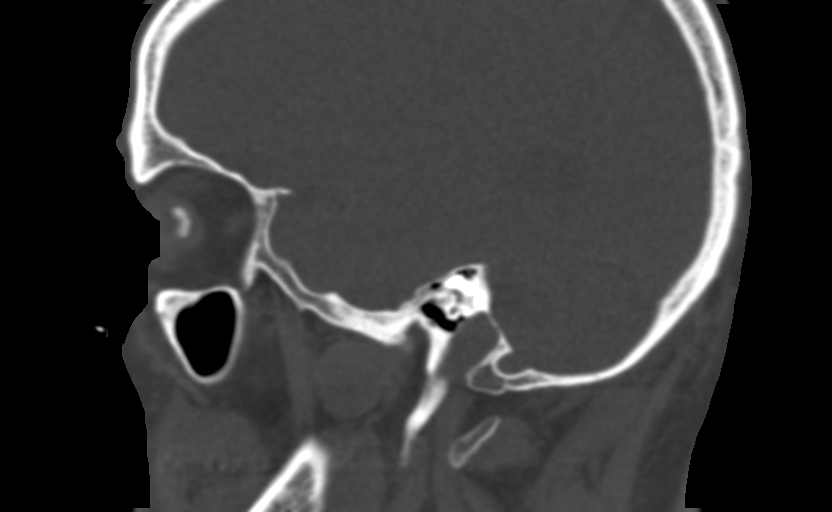
[im 51/101  bone]
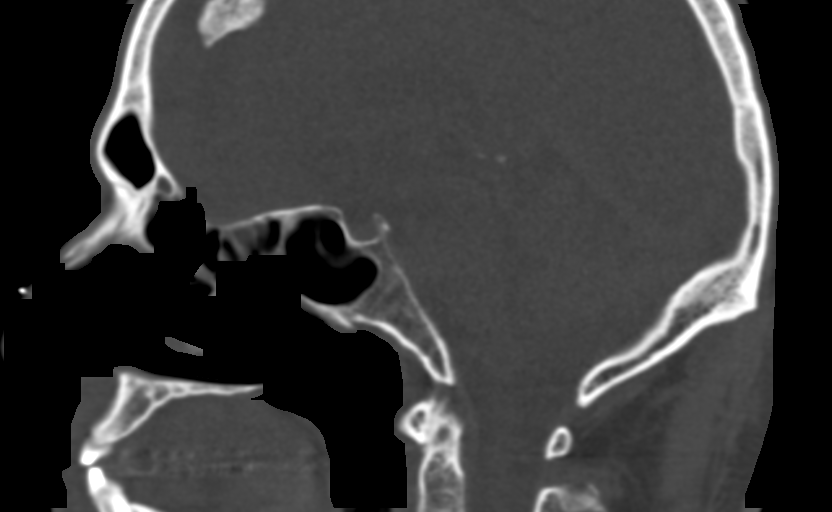
[im 67/101  bone]
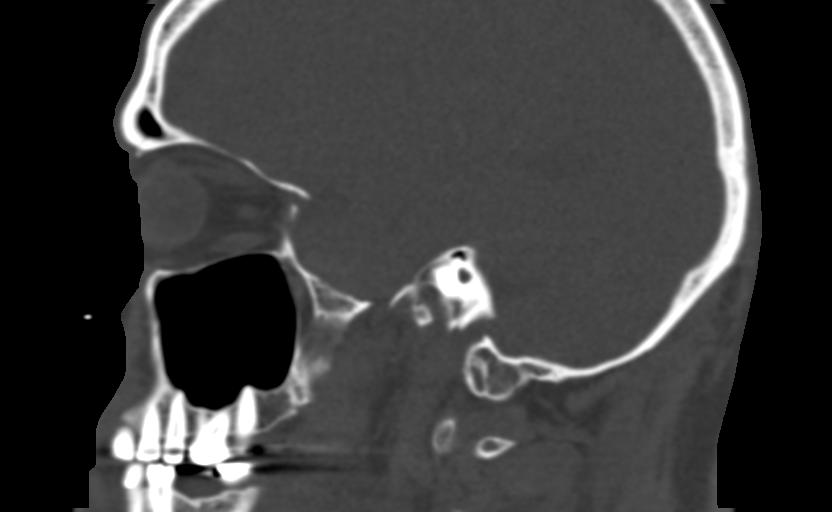

[15 of 37 positions shown; findings below may reference images not displayed]

FINDINGS: Osseous: No fracture or mandibular dislocation. No destructive
process.

Orbits: Right Phthisis bulbi. Likely left lens replacement.
Otherwise unremarkable left orbit. No traumatic or inflammatory
finding.

Sinuses: Mucosal thickening of the right maxillary sinus. Mucosal
polyp the left maxillary sinus. Mild sphenoid mucosal thickening.

Soft tissues: Negative.

Limited intracranial: No significant or unexpected finding.
IMPRESSION: 1. No acute displaced facial fracture.
2. Mild bilateral maxillary and sphenoid sinus mucosal thickening.

## 2023-03-23 ENCOUNTER — Other Ambulatory Visit (HOSPITAL_COMMUNITY): Payer: Self-pay

## 2023-08-13 NOTE — Progress Notes (Signed)
 Triglycerides borderline high, cholesterol otherwise normal. She should continue with currents statin. I also advise the following.  - regular aerobic exercise at least 150 minutes weekly. - reduced dietary intake of carbs, high sugar food and drink, and saturate and trans fats. The Mediaterianin Diet and DASH diet are examples of healthy guidelines.  Increased consumption of fish can also  be helpful to lower triglycerides.  - Limit alcohol use to 2 or fewer drinks daily.  Cmp shows slightly elevated blood sugar at 152, otherwise normal.   Microalbumin normal, no concern for kidney function.   Cbc shows platelet count has improved but remains low, will continue to monitor. CBC otherwise normal.

## 2023-08-17 ENCOUNTER — Other Ambulatory Visit: Payer: Self-pay | Admitting: Family Medicine

## 2023-08-17 DIAGNOSIS — F32A Depression, unspecified: Secondary | ICD-10-CM

## 2023-10-09 ENCOUNTER — Other Ambulatory Visit: Payer: Self-pay | Admitting: Family Medicine

## 2023-10-09 DIAGNOSIS — E1165 Type 2 diabetes mellitus with hyperglycemia: Secondary | ICD-10-CM

## 2023-12-21 ENCOUNTER — Other Ambulatory Visit: Payer: Self-pay

## 2023-12-21 DIAGNOSIS — E1139 Type 2 diabetes mellitus with other diabetic ophthalmic complication: Secondary | ICD-10-CM

## 2023-12-27 ENCOUNTER — Other Ambulatory Visit (HOSPITAL_COMMUNITY): Payer: Self-pay

## 2024-02-13 ENCOUNTER — Other Ambulatory Visit (HOSPITAL_COMMUNITY): Payer: Self-pay
